# Patient Record
Sex: Male | Born: 1995 | Hispanic: No | Marital: Married | Smoking: Never smoker
Health system: Southern US, Community
[De-identification: ages and names within clinical notes are randomized; demographics above are authoritative.]

## PROBLEM LIST (undated history)

## (undated) DIAGNOSIS — D649 Anemia, unspecified: Secondary | ICD-10-CM

## (undated) DIAGNOSIS — D509 Iron deficiency anemia, unspecified: Secondary | ICD-10-CM

## (undated) DIAGNOSIS — K5090 Crohn's disease, unspecified, without complications: Secondary | ICD-10-CM

## (undated) DIAGNOSIS — R634 Abnormal weight loss: Secondary | ICD-10-CM

## (undated) DIAGNOSIS — I861 Scrotal varices: Secondary | ICD-10-CM

## (undated) DIAGNOSIS — K219 Gastro-esophageal reflux disease without esophagitis: Secondary | ICD-10-CM

## (undated) DIAGNOSIS — D72810 Lymphocytopenia: Secondary | ICD-10-CM

## (undated) HISTORY — DX: Gastro-esophageal reflux disease without esophagitis: K21.9

## (undated) HISTORY — DX: Lymphocytopenia: D72.810

## (undated) HISTORY — DX: Abnormal weight loss: R63.4

## (undated) HISTORY — DX: Iron deficiency anemia, unspecified: D50.9

## (undated) HISTORY — DX: Anemia, unspecified: D64.9

## (undated) HISTORY — DX: Scrotal varices: I86.1

---
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
## 2013-04-26 ENCOUNTER — Other Ambulatory Visit: Payer: Self-pay | Admitting: Hematology and Oncology

## 2013-04-26 ENCOUNTER — Encounter: Payer: Self-pay | Admitting: Hematology and Oncology

## 2013-04-26 DIAGNOSIS — D649 Anemia, unspecified: Secondary | ICD-10-CM

## 2013-04-26 DIAGNOSIS — D509 Iron deficiency anemia, unspecified: Secondary | ICD-10-CM | POA: Insufficient documentation

## 2013-04-26 DIAGNOSIS — D72819 Decreased white blood cell count, unspecified: Secondary | ICD-10-CM

## 2013-04-26 HISTORY — DX: Anemia, unspecified: D64.9

## 2013-04-27 ENCOUNTER — Other Ambulatory Visit (HOSPITAL_BASED_OUTPATIENT_CLINIC_OR_DEPARTMENT_OTHER): Payer: BC Managed Care – PPO | Admitting: Lab

## 2013-04-27 ENCOUNTER — Encounter (INDEPENDENT_AMBULATORY_CARE_PROVIDER_SITE_OTHER): Payer: Self-pay

## 2013-04-27 ENCOUNTER — Ambulatory Visit (HOSPITAL_BASED_OUTPATIENT_CLINIC_OR_DEPARTMENT_OTHER): Payer: BC Managed Care – PPO | Admitting: Hematology and Oncology

## 2013-04-27 ENCOUNTER — Telehealth: Payer: Self-pay | Admitting: Hematology and Oncology

## 2013-04-27 ENCOUNTER — Encounter: Payer: Self-pay | Admitting: Hematology and Oncology

## 2013-04-27 VITALS — BP 122/71 | HR 77 | Temp 97.7°F | Resp 18 | Ht 77.0 in | Wt 212.3 lb

## 2013-04-27 DIAGNOSIS — D649 Anemia, unspecified: Secondary | ICD-10-CM

## 2013-04-27 DIAGNOSIS — D72810 Lymphocytopenia: Secondary | ICD-10-CM

## 2013-04-27 DIAGNOSIS — D72819 Decreased white blood cell count, unspecified: Secondary | ICD-10-CM

## 2013-04-27 LAB — IRON AND TIBC CHCC
%SAT: 29 % (ref 20–55)
Iron: 104 ug/dL (ref 42–163)
TIBC: 365 ug/dL (ref 202–409)
UIBC: 261 ug/dL (ref 117–376)

## 2013-04-27 LAB — COMPREHENSIVE METABOLIC PANEL (CC13)
ALT: 23 U/L (ref 0–55)
AST: 14 U/L (ref 5–34)
Albumin: 3.6 g/dL (ref 3.5–5.0)
Alkaline Phosphatase: 64 U/L (ref 40–150)
Anion Gap: 8 mEq/L (ref 3–11)
BUN: 10.2 mg/dL (ref 7.0–26.0)
CO2: 28 mEq/L (ref 22–29)
Calcium: 9.7 mg/dL (ref 8.4–10.4)
Chloride: 104 mEq/L (ref 98–109)
Creatinine: 0.8 mg/dL (ref 0.7–1.3)
Glucose: 92 mg/dl (ref 70–140)
Potassium: 4 mEq/L (ref 3.5–5.1)
Sodium: 141 mEq/L (ref 136–145)
Total Bilirubin: 0.86 mg/dL (ref 0.20–1.20)
Total Protein: 7.5 g/dL (ref 6.4–8.3)

## 2013-04-27 LAB — CBC & DIFF AND RETIC
BASO%: 0.3 % (ref 0.0–2.0)
Basophils Absolute: 0 10*3/uL (ref 0.0–0.1)
EOS%: 1.7 % (ref 0.0–7.0)
Eosinophils Absolute: 0.1 10*3/uL (ref 0.0–0.5)
HCT: 40.7 % (ref 38.4–49.9)
HGB: 13.3 g/dL (ref 13.0–17.1)
Immature Retic Fract: 3.6 % (ref 3.00–10.60)
LYMPH%: 11.2 % — ABNORMAL LOW (ref 14.0–49.0)
MCH: 27.1 pg — ABNORMAL LOW (ref 27.2–33.4)
MCHC: 32.7 g/dL (ref 32.0–36.0)
MCV: 82.9 fL (ref 79.3–98.0)
MONO#: 0.9 10*3/uL (ref 0.1–0.9)
MONO%: 12.9 % (ref 0.0–14.0)
NEUT#: 4.9 10*3/uL (ref 1.5–6.5)
NEUT%: 73.9 % (ref 39.0–75.0)
Platelets: 309 10*3/uL (ref 140–400)
RBC: 4.91 10*6/uL (ref 4.20–5.82)
RDW: 15.4 % — ABNORMAL HIGH (ref 11.0–14.6)
Retic %: 0.9 % (ref 0.80–1.80)
Retic Ct Abs: 44.19 10*3/uL (ref 34.80–93.90)
WBC: 6.6 10*3/uL (ref 4.0–10.3)
lymph#: 0.7 10*3/uL — ABNORMAL LOW (ref 0.9–3.3)

## 2013-04-27 LAB — MORPHOLOGY: PLT EST: ADEQUATE

## 2013-04-27 LAB — FERRITIN CHCC: Ferritin: 12 ng/ml — ABNORMAL LOW (ref 22–316)

## 2013-04-27 NOTE — Progress Notes (Signed)
[facility] OFFICE PROGRESS NOTE  Emeterio Reeve, MD DIAGNOSIS:  Lymphopenia, anemia  SUMMARY OF HEMATOLOGIC HISTORY: This is an otherwise healthy 17 year old patient was referred here because of abnormal CBC. The patient was asymptomatic. He was placed on oral ion supplements for anemia. INTERVAL HISTORY: Happy [place] 17 y.o. male returns for further followup. The patient denies any recent signs or symptoms of bleeding such as spontaneous epistaxis, hematuria or hematochezia. He denies any recent fever, chills, night sweats or abnormal weight loss He denies any recent infection. No recent joint rashes or joint swelling.  I have reviewed the past medical history, past surgical history, social history and family history with the patient and they are unchanged from previous note.  ALLERGIES:  has No Known Allergies.  MEDICATIONS:  Current Outpatient Prescriptions  Medication Sig Dispense Refill  . B Complex-C (SUPER B COMPLEX PO) Take 1 tablet by mouth daily.      Marland Kitchen FERROUS SULFATE PO Take 65 mg by mouth daily.      . Multiple Vitamin (MULTIVITAMIN) tablet Take 1 tablet by mouth daily.       No current facility-administered medications for this visit.     REVIEW OF SYSTEMS:   Constitutional: Denies fevers, chills or night sweats Eyes: Denies blurriness of vision Ears, nose, mouth, throat, and face: Denies mucositis or sore throat Respiratory: Denies cough, dyspnea or wheezes Cardiovascular: Denies palpitation, chest discomfort or lower extremity swelling Gastrointestinal:  Denies nausea, heartburn or change in bowel habits Skin: Denies abnormal skin rashes Lymphatics: Denies new lymphadenopathy or easy bruising Neurological:Denies numbness, tingling or new weaknesses Behavioral/Psych: Mood is stable, no new changes  All other systems were reviewed with the patient and are negative.  PHYSICAL EXAMINATION: ECOG PERFORMANCE STATUS: 0 -  Asymptomatic  Filed Vitals:   04/27/13 1229  BP: 122/71  Pulse: 77  Temp: 97.7 F (36.5 C)  Resp: 18   Filed Weights   04/27/13 1229  Weight: 212 lb 4.8 oz (96.299 kg)    GENERAL:alert, no distress and comfortable SKIN: skin color, texture, turgor are normal, no rashes or significant lesions EYES: normal, Conjunctiva are pink and non-injected, sclera clear OROPHARYNX:no exudate, no erythema and lips, buccal mucosa, and tongue normal  NECK: supple, thyroid normal size, non-tender, without nodularity LYMPH:  no palpable lymphadenopathy in the cervical, axillary or inguinal LUNGS: clear to auscultation and percussion with normal breathing effort HEART: regular rate & rhythm and no murmurs and no lower extremity edema ABDOMEN:abdomen soft, non-tender and normal bowel sounds Musculoskeletal:no cyanosis of digits and no clubbing  NEURO: alert & oriented x 3 with fluent speech, no focal motor/sensory deficits  LABORATORY DATA:  I have reviewed the data as listed Results for orders placed in visit on 04/27/13 (from the past 48 hour(s))  CBC & DIFF AND RETIC     Status: Abnormal   Collection Time    04/27/13 12:16 PM      Result Value Range   WBC 6.6  4.0 - 10.3 10e3/uL   NEUT# 4.9  1.5 - 6.5 10e3/uL   HGB 13.3  13.0 - 17.1 g/dL   HCT 40.3  47.4 - 25.9 %   Platelets 309  140 - 400 10e3/uL   MCV 82.9  79.3 - 98.0 fL   MCH 27.1 (*) 27.2 - 33.4 pg   MCHC 32.7  32.0 - 36.0 g/dL   RBC 5.63  8.75 - 6.43 10e6/uL   RDW 15.4 (*) 11.0 - 14.6 %  lymph# 0.7 (*) 0.9 - 3.3 10e3/uL   MONO# 0.9  0.1 - 0.9 10e3/uL   Eosinophils Absolute 0.1  0.0 - 0.5 10e3/uL   Basophils Absolute 0.0  0.0 - 0.1 10e3/uL   NEUT% 73.9  39.0 - 75.0 %   LYMPH% 11.2 (*) 14.0 - 49.0 %   MONO% 12.9  0.0 - 14.0 %   EOS% 1.7  0.0 - 7.0 %   BASO% 0.3  0.0 - 2.0 %   Retic % 0.90  0.80 - 1.80 %   Retic Ct Abs 44.19  34.80 - 93.90 10e3/uL   Immature Retic Fract 3.60  3.00 - 10.60 %    ASSESSMENT:  #1 anemia,  resolved #2 lymphopenia  PLAN:  #1 anemia, resolved Cause is unknown. He was taking oral iron supplement. Ferritin level is pending. The ferritin is above 50 will stop the iron supplement and monitor. #2 lymphopenia His total white count is normal. The patient is asymptomatic. Causes unknown. We will observe. I plan to see him back in 9 months with repeat blood work and additional workup. In the meantime we will just observe as the patient is asymptomatic. All questions were answered. The patient knows to call the clinic with any problems, questions or concerns. No barriers to learning was detected.  I spent 15 minutes counseling the patient face to face. The total time spent in the appointment was 20 minutes and more than 50% was on counseling.     [facility], NI, MD 04/27/2013 12:44 PM

## 2013-04-27 NOTE — Telephone Encounter (Signed)
Gave pt appt for lab and MD on July 2015

## 2014-01-25 ENCOUNTER — Ambulatory Visit (HOSPITAL_BASED_OUTPATIENT_CLINIC_OR_DEPARTMENT_OTHER): Payer: BC Managed Care – PPO | Admitting: Hematology and Oncology

## 2014-01-25 ENCOUNTER — Encounter: Payer: Self-pay | Admitting: Hematology and Oncology

## 2014-01-25 ENCOUNTER — Other Ambulatory Visit (HOSPITAL_BASED_OUTPATIENT_CLINIC_OR_DEPARTMENT_OTHER): Payer: BC Managed Care – PPO

## 2014-01-25 VITALS — BP 132/76 | HR 69 | Temp 98.6°F | Resp 18 | Ht 77.0 in | Wt 194.8 lb

## 2014-01-25 DIAGNOSIS — D72819 Decreased white blood cell count, unspecified: Secondary | ICD-10-CM

## 2014-01-25 DIAGNOSIS — D72810 Lymphocytopenia: Secondary | ICD-10-CM

## 2014-01-25 DIAGNOSIS — Z862 Personal history of diseases of the blood and blood-forming organs and certain disorders involving the immune mechanism: Secondary | ICD-10-CM

## 2014-01-25 DIAGNOSIS — D509 Iron deficiency anemia, unspecified: Secondary | ICD-10-CM

## 2014-01-25 DIAGNOSIS — D649 Anemia, unspecified: Secondary | ICD-10-CM

## 2014-01-25 LAB — CBC WITH DIFFERENTIAL/PLATELET
BASO%: 0.8 % (ref 0.0–2.0)
Basophils Absolute: 0.1 10*3/uL (ref 0.0–0.1)
EOS%: 2.6 % (ref 0.0–7.0)
Eosinophils Absolute: 0.2 10*3/uL (ref 0.0–0.5)
HCT: 42.8 % (ref 38.4–49.9)
HGB: 13.7 g/dL (ref 13.0–17.1)
LYMPH%: 14.4 % (ref 14.0–49.0)
MCH: 26.9 pg — ABNORMAL LOW (ref 27.2–33.4)
MCHC: 32 g/dL (ref 32.0–36.0)
MCV: 84 fL (ref 79.3–98.0)
MONO#: 1 10*3/uL — ABNORMAL HIGH (ref 0.1–0.9)
MONO%: 13.9 % (ref 0.0–14.0)
NEUT#: 5.1 10*3/uL (ref 1.5–6.5)
NEUT%: 68.3 % (ref 39.0–75.0)
Platelets: 333 10*3/uL (ref 140–400)
RBC: 5.09 10*6/uL (ref 4.20–5.82)
RDW: 15.7 % — ABNORMAL HIGH (ref 11.0–14.6)
WBC: 7.4 10*3/uL (ref 4.0–10.3)
lymph#: 1.1 10*3/uL (ref 0.9–3.3)

## 2014-01-25 LAB — MORPHOLOGY
PLT EST: ADEQUATE
RBC Comments: NORMAL

## 2014-01-25 LAB — FERRITIN CHCC: Ferritin: 30 ng/ml (ref 22–316)

## 2014-01-25 NOTE — Assessment & Plan Note (Signed)
This has resolved. No further workup is needed.

## 2014-01-25 NOTE — Progress Notes (Signed)
[place] OFFICE PROGRESS NOTE  Mark Coma, MD SUMMARY OF HEMATOLOGIC HISTORY: This is an otherwise healthy 18 year old patient was referred here because of abnormal CBC. The patient was asymptomatic. He was placed on oral ion supplements for anemia. INTERVAL HISTORY: Mark Decker 18 y.o. male returns for further followup. He denies recent infection.  I have reviewed the past medical history, past surgical history, social history and family history with the patient and they are unchanged from previous note.  ALLERGIES:  has No Known Allergies.  MEDICATIONS:  Current Outpatient Prescriptions  Medication Sig Dispense Refill  . B Complex-C (SUPER B COMPLEX PO) Take 1 tablet by mouth daily.      Marland Kitchen FERROUS SULFATE PO Take 65 mg by mouth daily.      . Multiple Vitamin (MULTIVITAMIN) tablet Take 1 tablet by mouth daily.       No current facility-administered medications for this visit.     REVIEW OF SYSTEMS:   Constitutional: Denies fevers, chills or night sweats Eyes: Denies blurriness of vision Ears, nose, mouth, throat, and face: Denies mucositis or sore throat Respiratory: Denies cough, dyspnea or wheezes Cardiovascular: Denies palpitation, chest discomfort or lower extremity swelling Gastrointestinal:  Denies nausea, heartburn or change in bowel habits Skin: Denies abnormal skin rashes Lymphatics: Denies new lymphadenopathy or easy bruising Neurological:Denies numbness, tingling or new weaknesses Behavioral/Psych: Mood is stable, no new changes  All other systems were reviewed with the patient and are negative.  PHYSICAL EXAMINATION: ECOG PERFORMANCE STATUS: 0 - Asymptomatic  Filed Vitals:   01/25/14 1425  BP: 132/76  Pulse: 69  Temp: 98.6 F (37 C)  Resp: 18   Filed Weights   01/25/14 1425  Weight: 194 lb 12.8 oz (88.361 kg)    GENERAL:alert, no distress and comfortable SKIN: skin color, texture, turgor are normal, no rashes or  significant lesions Musculoskeletal:no cyanosis of digits and no clubbing  NEURO: alert & oriented x 3 with fluent speech, no focal motor/sensory deficits  LABORATORY DATA:  I have reviewed the data as listed Results for orders placed in visit on 01/25/14 (from the past 48 hour(s))  MORPHOLOGY     Status: None   Collection Time    01/25/14  2:09 PM      Result Value Ref Range   RBC Comments Within Normal Limits  Within Normal Limits   White Cell Comments C/W auto diff     PLT EST Adequate  Adequate  FERRITIN [facility]     Status: None   Collection Time    01/25/14  2:09 PM      Result Value Ref Range   Ferritin 30  22 - 316 ng/ml  CBC WITH DIFFERENTIAL     Status: Abnormal   Collection Time    01/25/14  2:12 PM      Result Value Ref Range   WBC 7.4  4.0 - 10.3 10e3/uL   NEUT# 5.1  1.5 - 6.5 10e3/uL   HGB 13.7  13.0 - 17.1 g/dL   HCT 42.8  38.4 - 49.9 %   Platelets 333  140 - 400 10e3/uL   MCV 84.0  79.3 - 98.0 fL   MCH 26.9 (*) 27.2 - 33.4 pg   MCHC 32.0  32.0 - 36.0 g/dL   RBC 5.09  4.20 - 5.82 10e6/uL   RDW 15.7 (*) 11.0 - 14.6 %   lymph# 1.1  0.9 - 3.3 10e3/uL   MONO# 1.0 (*) 0.1 - 0.9 10e3/uL  Eosinophils Absolute 0.2  0.0 - 0.5 10e3/uL   Basophils Absolute 0.1  0.0 - 0.1 10e3/uL   NEUT% 68.3  39.0 - 75.0 %   LYMPH% 14.4  14.0 - 49.0 %   MONO% 13.9  0.0 - 14.0 %   EOS% 2.6  0.0 - 7.0 %   BASO% 0.8  0.0 - 2.0 %    Lab Results  Component Value Date   WBC 7.4 01/25/2014   HGB 13.7 01/25/2014   HCT 42.8 01/25/2014   MCV 84.0 01/25/2014   PLT 333 01/25/2014    ASSESSMENT & PLAN:  Iron deficiency anemia, unspecified This has resolved. I recommend discontinuation of oral iron supplement.  Leukopenia This has resolved. No further workup is needed.    All questions were answered. The patient knows to call the clinic with any problems, questions or concerns. No barriers to learning was detected.  I spent 10 minutes counseling the patient face to face. The total time  spent in the appointment was 15 minutes and more than 50% was on counseling.     [facility], NI, MD 01/25/2014 4:04 PM

## 2014-01-25 NOTE — Assessment & Plan Note (Signed)
This has resolved. I recommend discontinuation of oral iron supplement.

## 2014-01-26 LAB — SEDIMENTATION RATE: Sed Rate: 9 mm/hr (ref 0–16)

## 2014-05-22 ENCOUNTER — Other Ambulatory Visit: Payer: Self-pay | Admitting: Gastroenterology

## 2014-05-22 DIAGNOSIS — K5010 Crohn's disease of large intestine without complications: Secondary | ICD-10-CM

## 2014-06-05 ENCOUNTER — Ambulatory Visit
Admission: RE | Admit: 2014-06-05 | Discharge: 2014-06-05 | Disposition: A | Discharge status: Home / Self Care | Payer: BC Managed Care – PPO | Source: Ambulatory Visit | Attending: Gastroenterology | Admitting: Gastroenterology

## 2014-06-05 DIAGNOSIS — K5010 Crohn's disease of large intestine without complications: Secondary | ICD-10-CM

## 2014-06-05 MED ORDER — IOHEXOL 300 MG/ML  SOLN
100.0000 mL | Freq: Once | INTRAMUSCULAR | Status: AC | PRN
  Administered 2014-06-05: 100 mL via INTRAVENOUS

## 2014-09-05 ENCOUNTER — Ambulatory Visit
Admission: RE | Admit: 2014-09-05 | Discharge: 2014-09-05 | Disposition: A | Discharge status: Home / Self Care | Payer: BLUE CROSS/BLUE SHIELD | Source: Ambulatory Visit | Attending: Gastroenterology | Admitting: Gastroenterology

## 2014-09-05 ENCOUNTER — Other Ambulatory Visit: Payer: Self-pay | Admitting: Gastroenterology

## 2014-09-05 DIAGNOSIS — K50919 Crohn's disease, unspecified, with unspecified complications: Secondary | ICD-10-CM

## 2015-01-17 ENCOUNTER — Ambulatory Visit
Admission: RE | Admit: 2015-01-17 | Discharge: 2015-01-17 | Disposition: A | Discharge status: Home / Self Care | Payer: BLUE CROSS/BLUE SHIELD | Source: Ambulatory Visit | Attending: Gastroenterology | Admitting: Gastroenterology

## 2015-01-17 ENCOUNTER — Other Ambulatory Visit: Payer: Self-pay | Admitting: Gastroenterology

## 2015-01-17 DIAGNOSIS — Z8719 Personal history of other diseases of the digestive system: Secondary | ICD-10-CM

## 2015-02-03 ENCOUNTER — Other Ambulatory Visit: Payer: Self-pay | Admitting: Gastroenterology

## 2015-02-03 DIAGNOSIS — K50819 Crohn's disease of both small and large intestine with unspecified complications: Secondary | ICD-10-CM

## 2015-02-04 ENCOUNTER — Ambulatory Visit
Admission: RE | Admit: 2015-02-04 | Discharge: 2015-02-04 | Disposition: A | Discharge status: Home / Self Care | Payer: BLUE CROSS/BLUE SHIELD | Source: Ambulatory Visit | Attending: Gastroenterology | Admitting: Gastroenterology

## 2015-02-04 DIAGNOSIS — K50819 Crohn's disease of both small and large intestine with unspecified complications: Secondary | ICD-10-CM

## 2015-02-28 ENCOUNTER — Encounter (HOSPITAL_COMMUNITY): Payer: Self-pay | Admitting: *Deleted

## 2015-02-28 ENCOUNTER — Emergency Department (HOSPITAL_COMMUNITY)
Admission: EM | Admit: 2015-02-28 | Discharge: 2015-02-28 | Disposition: A | Discharge status: Home / Self Care | Payer: BLUE CROSS/BLUE SHIELD | Attending: Emergency Medicine | Admitting: Emergency Medicine

## 2015-02-28 DIAGNOSIS — R109 Unspecified abdominal pain: Secondary | ICD-10-CM

## 2015-02-28 DIAGNOSIS — Z8679 Personal history of other diseases of the circulatory system: Secondary | ICD-10-CM | POA: Diagnosis not present

## 2015-02-28 DIAGNOSIS — K5090 Crohn's disease, unspecified, without complications: Secondary | ICD-10-CM | POA: Diagnosis not present

## 2015-02-28 DIAGNOSIS — Z79899 Other long term (current) drug therapy: Secondary | ICD-10-CM | POA: Diagnosis not present

## 2015-02-28 DIAGNOSIS — R1030 Lower abdominal pain, unspecified: Secondary | ICD-10-CM | POA: Diagnosis present

## 2015-02-28 DIAGNOSIS — D509 Iron deficiency anemia, unspecified: Secondary | ICD-10-CM | POA: Diagnosis not present

## 2015-02-28 DIAGNOSIS — K50919 Crohn's disease, unspecified, with unspecified complications: Secondary | ICD-10-CM

## 2015-02-28 HISTORY — DX: Crohn's disease, unspecified, without complications: K50.90

## 2015-02-28 MED ORDER — PROMETHAZINE HCL 25 MG PO TABS: 25.0000 mg | ORAL_TABLET | Freq: Four times a day (QID) | ORAL | Status: DC | PRN

## 2015-02-28 MED ORDER — HYDROCODONE-ACETAMINOPHEN 5-325 MG PO TABS: 1.0000 | ORAL_TABLET | Freq: Four times a day (QID) | ORAL | Status: DC | PRN

## 2015-02-28 MED ORDER — PROMETHAZINE HCL 25 MG PO TABS
25.0000 mg | ORAL_TABLET | Freq: Once | ORAL | Status: AC
  Administered 2015-02-28: 25 mg via ORAL
  Filled 2015-02-28: qty 1

## 2015-02-28 MED ORDER — PREDNISONE 20 MG PO TABS: ORAL_TABLET | ORAL | Status: DC

## 2015-02-28 MED ORDER — OXYCODONE-ACETAMINOPHEN 5-325 MG PO TABS
1.0000 | ORAL_TABLET | Freq: Once | ORAL | Status: AC
  Administered 2015-02-28: 1 via ORAL
  Filled 2015-02-28: qty 1

## 2015-02-28 NOTE — Discharge Instructions (Signed)
Please follow up with Dr. Michail Sermon to schedule a follow up appointment. Please take pain medication and/or muscle relaxants as prescribed and as needed for pain. Please do not drive on narcotic pain medication or on muscle relaxants. Please read all discharge instructions and return precautions.    Crohn Disease Crohn disease is a long-term (chronic) soreness and redness (inflammation) of the intestines (bowel). It can affect any portion of the digestive tract, from the mouth to the anus. It can also cause problems outside the digestive tract. Crohn disease is closely related to a disease called ulcerative colitis (together, these two diseases are called inflammatory bowel disease).  CAUSES  The cause of Crohn disease is not known. One Link Snuffer is that, in an easily affected person, the immune system is triggered to attack the body's own digestive tissue. Crohn disease runs in families. It seems to be more common in certain geographic areas and amongst certain races. There are no clear-cut dietary causes.  SYMPTOMS  Crohn disease can cause many different symptoms since it can affect many different parts of the body. Symptoms include:  Fatigue.  Weight loss.  Chronic diarrhea, sometime bloody.  Abdominal pain and cramps.  Fever.  Ulcers or canker sores in the mouth or rectum.  Anemia (low red blood cells).  Arthritis, skin problems, and eye problems may occur. Complications of Crohn disease can include:  Series of holes (perforation) of the bowel.  Portions of the intestines sticking to each other (adhesions).  Obstruction of the bowel.  Fistula formation, typically in the rectal area but also in other areas. A fistula is an opening between the bowels and the outside, or between the bowels and another organ.  A painful crack in the mucous membrane of the anus (rectal fissure). DIAGNOSIS  Your caregiver may suspect Crohn disease based on your symptoms and an exam. Blood tests may  confirm that there is a problem. You may be asked to submit a stool specimen for examination. X-rays and CT scans may be necessary. Ultimately, the diagnosis is usually made after a procedure that uses a flexible tube that is inserted via your mouth or your anus. This is done under sedation and is called either an upper endoscopy or colonoscopy. With these tests, the specialist can take tiny tissue samples and remove them from the inside of the bowel (biopsy). Examination of this biopsy tissue under a microscope can reveal Crohn disease as the cause of your symptoms. Due to the many different forms that Crohn disease can take, symptoms may be present for several years before a diagnosis is made. TREATMENT  Medications are often used to decrease inflammation and control the immune system. These include medicines related to aspirin, steroid medications, and newer and stronger medications to slow down the immune system. Some medications may be used as suppositories or enemas. A number of other medications are used or have been studied. Your caregiver will make specific recommendations. HOME CARE INSTRUCTIONS   Symptoms such as diarrhea can be controlled with medications. Avoid foods that have a laxative effect such as fresh fruit, vegetables, and dairy products. During flare-ups, you can rest your bowel by refraining from solid foods. Drink clear liquids frequently during the day. (Electrolyte or rehydrating fluids are best. Your caregiver can help you with suggestions.) Drink often to prevent loss of body fluids (dehydration). When diarrhea has cleared, eat small meals and more frequently. Avoid food additives and stimulants such as caffeine (coffee, tea, or chocolate). Enzyme supplements may help if  you develop intolerance to a sugar in dairy products (lactose). Ask your caregiver or dietitian about specific dietary instructions.  Try to maintain a positive attitude. Learn relaxation techniques such as  self-hypnosis, mental imaging, and muscle relaxation.  If possible, avoid stresses which can aggravate your condition.  Exercise regularly.  Follow your diet.  Always get plenty of rest. SEEK MEDICAL CARE IF:   Your symptoms fail to improve after a week or two of new treatment.  You experience continued weight loss.  You have ongoing cramps or loose bowels.  You develop a new skin rash, skin sores, or eye problems. SEEK IMMEDIATE MEDICAL CARE IF:   You have worsening of your symptoms or develop new symptoms.  You have a fever.  You develop bloody diarrhea.  You develop severe abdominal pain. MAKE SURE YOU:   Understand these instructions.  Will watch your condition.  Will get help right away if you are not doing well or get worse. Document Released: 03/24/2005 Document Revised: 10/29/2013 Document Reviewed: 02/20/2007 [facility] Patient Information 2015 [place], [place]. This information is not intended to replace advice given to you by your health care provider. Make sure you discuss any questions you have with your health care provider.

## 2015-02-28 NOTE — ED Notes (Signed)
Lab work drawn at [facility]

## 2015-02-28 NOTE — ED Provider Notes (Signed)
CSN: [id number]     Arrival date & time 02/28/15  1941 History   First MD Initiated Contact with Patient 02/28/15 2005     Chief Complaint  Patient presents with  . Abdominal Pain     (Consider location/radiation/quality/duration/timing/severity/associated sxs/prior Treatment) HPI Comments: Patient is a 19 yo M PMHx significant for Crohn's disease, Anemia, GERD, Lymphopenia presenting to the ED from [facility] for evaluation of leukocytosis. Patient states he went to [facility] today because he had been having a crohn's flare up. He states sharp intermittent lower abdominal pain with bright red blood in his stools. He states he had nausea and vomiting earlier in the day, but that has subsided. His pain has also improved as well. He states he was told to come to the ER because his WBC was 16.3, labs otherwise unremarkable. He is followed by Dr. Michail Sermon. No abdominal surgical history.   Patient is a 19 y.o. male presenting with abdominal pain.  Abdominal Pain   Past Medical History  Diagnosis Date  . Lymphopenia 04/14/2012    HIV negative in 03/2012 per PCP.  Marland Kitchen Gastroesophageal reflux disease   . Left varicocele   . Iron deficiency anemia   . Weight loss   . Anemia 04/26/2013  . Lymphopenia 04/27/2013  . Crohn disease    Past Surgical History  Procedure Laterality Date  . Varicocele excision  2010   Family History  Problem Relation Age of Onset  . Arthritis Mother   . Crohn's disease Mother   . Hypertension Mother   . IgA nephropathy Mother   . Arthritis Father   . Migraines Father   . Eczema Father   . Cancer Maternal Aunt 60    colon  . Cancer Paternal Grandfather     colon  . Migraines Sister    Social History  Substance Use Topics  . Smoking status: Never Smoker   . Smokeless tobacco: Never Used  . Alcohol Use: No    Review of Systems  Gastrointestinal: Positive for abdominal pain and blood in stool.  All other systems reviewed and are negative.     Allergies  Review  of patient's allergies indicates no known allergies.  Home Medications   Prior to Admission medications   Medication Sig Start Date End Date Taking? Authorizing Provider  Adalimumab (HUMIRA) 40 MG/0.8ML PSKT Inject into the skin. Patient  not sure how much he gets every two weeks   Yes Historical Provider, MD  B Complex-C (SUPER B COMPLEX PO) Take 1 tablet by mouth daily.   Yes Historical Provider, MD  FERROUS SULFATE PO Take 65 mg by mouth daily.   Yes Historical Provider, MD  mesalamine (LIALDA) 1.2 G EC tablet Take 1.2 g by mouth 4 (four) times daily.   Yes Historical Provider, MD  Multiple Vitamin (MULTIVITAMIN) tablet Take 1 tablet by mouth daily.   Yes Historical Provider, MD  HYDROcodone-acetaminophen (NORCO/VICODIN) 5-325 MG per tablet Take 1-2 tablets by mouth every 6 (six) hours as needed. 02/28/15   Jennifer Piepenbrink, PA-C  predniSONE (DELTASONE) 20 MG tablet Take 40mg  PO x 3 days. Take 20 mg PO until seen by Dr. Michail Sermon 02/28/15   Baron Sane, PA-C  promethazine (PHENERGAN) 25 MG tablet Take 1 tablet (25 mg total) by mouth every 6 (six) hours as needed for nausea or vomiting. 02/28/15   Jennifer Piepenbrink, PA-C   BP 117/68 mmHg  Pulse 75  Temp(Src) 98.6 F (37 C)  Resp 16  Ht 6\' 5"  (1.956 m)  Wt 204 lb 2 oz (92.59 kg)  BMI 24.20 kg/m2  SpO2 99% Physical Exam  Constitutional: He is oriented to person, place, and time. He appears well-developed and well-nourished.  HENT:  Head: Normocephalic and atraumatic.  Right Ear: External ear normal.  Left Ear: External ear normal.  Nose: Nose normal.  Eyes: Conjunctivae are normal.  Neck: Neck supple.  Cardiovascular: Normal rate, regular rhythm and normal heart sounds.   Pulmonary/Chest: Effort normal and breath sounds normal.  Abdominal: Soft. Bowel sounds are normal. He exhibits no distension. There is tenderness (lower abdomen). There is no rebound and no guarding.  Musculoskeletal: Normal range of motion.    Neurological: He is alert and oriented to person, place, and time.  Skin: Skin is warm and dry.  Nursing note and vitals reviewed.   ED Course  Procedures (including critical care time) Medications  promethazine (PHENERGAN) tablet 25 mg (25 mg Oral Given 02/28/15 2110)  oxyCODONE-acetaminophen (PERCOCET/ROXICET) 5-325 MG per tablet 1 tablet (1 tablet Oral Given 02/28/15 2111)    Labs Review Labs Reviewed - No data to display  Imaging Review No results found. I have personally reviewed and evaluated these images and lab results as part of my medical decision-making.   EKG Interpretation None          MDM   Final diagnoses:  Abdominal pain in male  Crohn's disease, unspecified complication   Filed Vitals:   02/28/15 2130  BP: 117/68  Pulse: 75  Temp:   Resp: 16    Afebrile, NAD, non-toxic appearing, AAOx4.   Abdomen soft, tender in lower quadrants, without peritoneal signs. No evidence of dehydration. Labs reviewed from [facility].  Patient states he is feeling better and would to be d/c home with nausea and pain medication, with plans to follow up with Dr. Michail Sermon on Monday. Offered IV fluids, nausea, and pain medications, but he prefers to go home with symptomatic care.   Discussed with Dr. Watt Climes who recommends Prednisone 40mg  QD x 3 days then 20 mg QD until Tuesday when he can see Dr. Michail Sermon.   Return precautions discussed. Patient is agreeable to plan. Patient is stable at time of discharge. Patient d/w with Dr. Maryan Rued, agrees with plan.     Baron Sane, PA-C 02/28/15 2228  Blanchie Dessert, MD 02/28/15 2256

## 2015-02-28 NOTE — ED Notes (Signed)
Jen PA at bedside. 

## 2015-02-28 NOTE — ED Notes (Signed)
The pt  Came here from [facility] with abd oain since this am with nv.Marland Kitchen Hx crohns diusease

## 2015-05-13 ENCOUNTER — Ambulatory Visit
Admission: RE | Admit: 2015-05-13 | Discharge: 2015-05-13 | Disposition: A | Discharge status: Home / Self Care | Payer: BLUE CROSS/BLUE SHIELD | Source: Ambulatory Visit | Attending: Gastroenterology | Admitting: Gastroenterology

## 2015-05-13 ENCOUNTER — Other Ambulatory Visit: Payer: Self-pay | Admitting: Gastroenterology

## 2015-05-13 DIAGNOSIS — K50819 Crohn's disease of both small and large intestine with unspecified complications: Secondary | ICD-10-CM

## 2015-05-13 MED ORDER — IOPAMIDOL (ISOVUE-300) INJECTION 61%
125.0000 mL | Freq: Once | INTRAVENOUS | Status: AC | PRN
  Administered 2015-05-13: 125 mL via INTRAVENOUS

## 2015-05-14 ENCOUNTER — Encounter (HOSPITAL_COMMUNITY): Payer: Self-pay | Admitting: *Deleted

## 2015-05-16 ENCOUNTER — Other Ambulatory Visit: Payer: Self-pay | Admitting: Gastroenterology

## 2015-05-16 NOTE — Addendum Note (Signed)
Addended by: [name] on: 05/16/2015 05:45 PM   Modules accepted: Orders

## 2015-05-21 ENCOUNTER — Ambulatory Visit (HOSPITAL_COMMUNITY)
Admission: RE | Admit: 2015-05-21 | Payer: BLUE CROSS/BLUE SHIELD | Source: Ambulatory Visit | Admitting: Gastroenterology

## 2015-05-21 SURGERY — COLONOSCOPY WITH PROPOFOL
Anesthesia: Monitor Anesthesia Care

## 2015-09-25 ENCOUNTER — Encounter (INDEPENDENT_AMBULATORY_CARE_PROVIDER_SITE_OTHER): Payer: BLUE CROSS/BLUE SHIELD | Admitting: Ophthalmology

## 2015-09-25 DIAGNOSIS — H43813 Vitreous degeneration, bilateral: Secondary | ICD-10-CM

## 2015-09-25 DIAGNOSIS — H33303 Unspecified retinal break, bilateral: Secondary | ICD-10-CM

## 2015-09-26 ENCOUNTER — Encounter (INDEPENDENT_AMBULATORY_CARE_PROVIDER_SITE_OTHER): Payer: BLUE CROSS/BLUE SHIELD | Admitting: Ophthalmology

## 2015-09-26 DIAGNOSIS — H33302 Unspecified retinal break, left eye: Secondary | ICD-10-CM | POA: Diagnosis not present

## 2015-10-15 ENCOUNTER — Encounter (INDEPENDENT_AMBULATORY_CARE_PROVIDER_SITE_OTHER): Payer: BLUE CROSS/BLUE SHIELD | Admitting: Ophthalmology

## 2015-10-15 DIAGNOSIS — H33303 Unspecified retinal break, bilateral: Secondary | ICD-10-CM | POA: Diagnosis not present

## 2015-10-23 ENCOUNTER — Ambulatory Visit (INDEPENDENT_AMBULATORY_CARE_PROVIDER_SITE_OTHER): Payer: BLUE CROSS/BLUE SHIELD | Admitting: Ophthalmology

## 2015-11-05 ENCOUNTER — Encounter (INDEPENDENT_AMBULATORY_CARE_PROVIDER_SITE_OTHER): Payer: BLUE CROSS/BLUE SHIELD | Admitting: Ophthalmology

## 2015-11-05 DIAGNOSIS — H33303 Unspecified retinal break, bilateral: Secondary | ICD-10-CM

## 2016-03-08 ENCOUNTER — Ambulatory Visit (INDEPENDENT_AMBULATORY_CARE_PROVIDER_SITE_OTHER): Payer: BLUE CROSS/BLUE SHIELD | Admitting: Ophthalmology

## 2016-03-08 DIAGNOSIS — H33303 Unspecified retinal break, bilateral: Secondary | ICD-10-CM

## 2016-03-08 DIAGNOSIS — H43813 Vitreous degeneration, bilateral: Secondary | ICD-10-CM

## 2016-12-27 DIAGNOSIS — H9012 Conductive hearing loss, unilateral, left ear, with unrestricted hearing on the contralateral side: Secondary | ICD-10-CM

## 2016-12-27 DIAGNOSIS — H6502 Acute serous otitis media, left ear: Secondary | ICD-10-CM | POA: Insufficient documentation

## 2016-12-27 HISTORY — DX: Conductive hearing loss, unilateral, left ear, with unrestricted hearing on the contralateral side: H90.12

## 2020-01-11 ENCOUNTER — Other Ambulatory Visit: Payer: Self-pay | Admitting: Family Medicine

## 2020-01-11 DIAGNOSIS — R591 Generalized enlarged lymph nodes: Secondary | ICD-10-CM

## 2020-01-16 ENCOUNTER — Ambulatory Visit
Admission: RE | Admit: 2020-01-16 | Discharge: 2020-01-16 | Disposition: A | Discharge status: Home / Self Care | Payer: BLUE CROSS/BLUE SHIELD | Source: Ambulatory Visit | Attending: Family Medicine | Admitting: Family Medicine

## 2020-01-16 DIAGNOSIS — R591 Generalized enlarged lymph nodes: Secondary | ICD-10-CM

## 2020-01-24 ENCOUNTER — Telehealth: Payer: Self-pay | Admitting: Hematology and Oncology

## 2020-01-24 NOTE — Telephone Encounter (Signed)
Received a new pt referral from Dr. Stephanie Acre for lymphocytopenia. [name] has been cld and scheduled to see [name] on 8/10 at 1pm. Pt last saw MD in 2015. Pt aware to arrive 15 minutes early.

## 2020-02-05 ENCOUNTER — Telehealth: Payer: Self-pay

## 2020-02-05 ENCOUNTER — Inpatient Hospital Stay: Payer: BC Managed Care – PPO | Admitting: Hematology and Oncology

## 2020-02-05 NOTE — Telephone Encounter (Signed)
He called and left a message to call him  Called back. He has a fever 99.7 today and he sometimes has fevers that last for 48 hours. Appt canceled for today. Instructed to go to PCP or Urgent care for a covid test and call back to reschedule appt once he has the results. He verbalized understanding.

## 2020-02-06 ENCOUNTER — Telehealth: Payer: Self-pay | Admitting: *Deleted

## 2020-02-06 NOTE — Telephone Encounter (Signed)
Per Dr.Gorsuch, called to make pt aware of 8/18 appt at 820am. Pt verbalized understanding.

## 2020-02-07 ENCOUNTER — Encounter: Payer: BC Managed Care – PPO | Admitting: Hematology

## 2020-02-13 ENCOUNTER — Other Ambulatory Visit: Payer: Self-pay

## 2020-02-13 ENCOUNTER — Ambulatory Visit (INDEPENDENT_AMBULATORY_CARE_PROVIDER_SITE_OTHER): Payer: BC Managed Care – PPO

## 2020-02-13 ENCOUNTER — Inpatient Hospital Stay: Payer: BC Managed Care – PPO | Attending: Hematology and Oncology | Admitting: Hematology and Oncology

## 2020-02-13 ENCOUNTER — Inpatient Hospital Stay: Payer: BC Managed Care – PPO

## 2020-02-13 ENCOUNTER — Encounter: Payer: Self-pay | Admitting: Hematology and Oncology

## 2020-02-13 ENCOUNTER — Telehealth: Payer: Self-pay

## 2020-02-13 DIAGNOSIS — D539 Nutritional anemia, unspecified: Secondary | ICD-10-CM

## 2020-02-13 DIAGNOSIS — K50919 Crohn's disease, unspecified, with unspecified complications: Secondary | ICD-10-CM

## 2020-02-13 DIAGNOSIS — R590 Localized enlarged lymph nodes: Secondary | ICD-10-CM

## 2020-02-13 DIAGNOSIS — K5090 Crohn's disease, unspecified, without complications: Secondary | ICD-10-CM | POA: Diagnosis not present

## 2020-02-13 DIAGNOSIS — R509 Fever, unspecified: Secondary | ICD-10-CM | POA: Diagnosis not present

## 2020-02-13 LAB — COMPREHENSIVE METABOLIC PANEL
ALT: 26 U/L (ref 0–44)
AST: 17 U/L (ref 15–41)
Albumin: 3.3 g/dL — ABNORMAL LOW (ref 3.5–5.0)
Alkaline Phosphatase: 64 U/L (ref 38–126)
Anion gap: 7 (ref 5–15)
BUN: 14 mg/dL (ref 6–20)
CO2: 26 mmol/L (ref 22–32)
Calcium: 9.9 mg/dL (ref 8.9–10.3)
Chloride: 104 mmol/L (ref 98–111)
Creatinine, Ser: 0.85 mg/dL (ref 0.61–1.24)
GFR calc Af Amer: >60 mL/min (ref >60)
GFR calc non Af Amer: >60 mL/min (ref >60)
Glucose, Bld: 102 mg/dL — ABNORMAL HIGH (ref 70–99)
Potassium: 4.2 mmol/L (ref 3.5–5.1)
Sodium: 137 mmol/L (ref 135–145)
Total Bilirubin: 0.4 mg/dL (ref 0.3–1.2)
Total Protein: 8.2 g/dL — ABNORMAL HIGH (ref 6.5–8.1)

## 2020-02-13 LAB — CBC WITH DIFFERENTIAL/PLATELET
Abs Immature Granulocytes: 0.01 10*3/uL (ref 0.00–0.07)
Basophils Absolute: 0 10*3/uL (ref 0.0–0.1)
Basophils Relative: 1 %
Eosinophils Absolute: 0.1 10*3/uL (ref 0.0–0.5)
Eosinophils Relative: 1 %
HCT: 37.7 % — ABNORMAL LOW (ref 39.0–52.0)
Hemoglobin: 12.3 g/dL — ABNORMAL LOW (ref 13.0–17.0)
Immature Granulocytes: 0 %
Lymphocytes Relative: 33 %
Lymphs Abs: 1.8 10*3/uL (ref 0.7–4.0)
MCH: 26.9 pg (ref 26.0–34.0)
MCHC: 32.6 g/dL (ref 30.0–36.0)
MCV: 82.5 fL (ref 80.0–100.0)
Monocytes Absolute: 1 10*3/uL (ref 0.1–1.0)
Monocytes Relative: 18 %
Neutro Abs: 2.6 10*3/uL (ref 1.7–7.7)
Neutrophils Relative %: 47 %
Platelets: 316 10*3/uL (ref 150–400)
RBC: 4.57 MIL/uL (ref 4.22–5.81)
RDW: 14 % (ref 11.5–15.5)
WBC: 5.6 10*3/uL (ref 4.0–10.5)
nRBC: 0 % (ref 0.0–0.2)

## 2020-02-13 LAB — HIV ANTIBODY (ROUTINE TESTING W REFLEX): HIV Screen 4th Generation wRfx: NONREACTIVE

## 2020-02-13 LAB — SEDIMENTATION RATE: Sed Rate: 88 mm/hr — ABNORMAL HIGH (ref 0–16)

## 2020-02-13 LAB — VITAMIN B12: Vitamin B-12: 472 pg/mL (ref 180–914)

## 2020-02-13 LAB — LACTATE DEHYDROGENASE: LDH: 152 U/L (ref 98–192)

## 2020-02-13 LAB — IRON AND TIBC
Iron: 30 ug/dL — ABNORMAL LOW (ref 42–163)
Saturation Ratios: 12 % — ABNORMAL LOW (ref 20–55)
TIBC: 239 ug/dL (ref 202–409)
UIBC: 209 ug/dL (ref 117–376)

## 2020-02-13 LAB — FERRITIN: Ferritin: 215 ng/mL (ref 24–336)

## 2020-02-13 MED ORDER — IOHEXOL 300 MG/ML  SOLN
100.0000 mL | Freq: Once | INTRAMUSCULAR | Status: AC | PRN
  Administered 2020-02-13: 100 mL via INTRAVENOUS

## 2020-02-13 NOTE — Telephone Encounter (Signed)
He called and left a message. His scan is scheduled for this afternoon. Thank you for seeing him and if you want to change return appt he would be glad to change.

## 2020-02-13 NOTE — Assessment & Plan Note (Signed)
According to the patient, his Crohn's disease is under control He will continue treatment for now I will assess with CT imaging

## 2020-02-13 NOTE — Telephone Encounter (Signed)
I can see him at 8 am on Friday, 20 mins Can you call him and get it changed?

## 2020-02-13 NOTE — Telephone Encounter (Signed)
Called back and given below message. He is agree to above time, instructed to arrive 15 mins early. He verbalized understanding.

## 2020-02-13 NOTE — Assessment & Plan Note (Signed)
He has intermittent fever and chills He had been tested for COVID-19 recently that was negative The patient has not been vaccinated and I encouraged him to get vaccinated in the near future As above, I will order some blood work and CT imaging for further assessment

## 2020-02-13 NOTE — Assessment & Plan Note (Signed)
The patient have diffuse lymphadenopathy affecting his axillary region, submandibular region as well as inguinal region He has active Crohn's disease and is on Humira The cause of the lymphadenopathy is either reactive versus malignant I will order some basic work-up for this Given his high risk situation, I would recommend CT imaging of the chest, abdomen and pelvis for further assessment He would likely need lymph node biopsy but I would like to assess the location of the best lymph node to recommend biopsy I will see him back next week for further follow-up

## 2020-02-13 NOTE — Progress Notes (Signed)
[facility] CONSULT NOTE  Patient Care Team: [name], MD as PCP - General (Family Medicine) [name], MD as Attending Physician (Family Medicine) [name], MD (Inactive) (Gastroenterology)  ASSESSMENT & PLAN:  Lymphadenopathy, axillary The patient have diffuse lymphadenopathy affecting his axillary region, submandibular region as well as inguinal region He has active Crohn's disease and is on Humira The cause of the lymphadenopathy is either reactive versus malignant I will order some basic work-up for this Given his high risk situation, I would recommend CT imaging of the chest, abdomen and pelvis for further assessment He would likely need lymph node biopsy but I would like to assess the location of the best lymph node to recommend biopsy I will see him back next week for further follow-up  Deficiency anemia He has history of iron deficiency anemia I will order some work-up for this including vitamin B12 level due to active Crohn's disease  Crohn disease ([place]) According to the patient, his Crohn's disease is under control He will continue treatment for now I will assess with CT imaging  Fever and chills He has intermittent fever and chills He had been tested for COVID-19 recently that was negative The patient has not been vaccinated and I encouraged him to get vaccinated in the near future As above, I will order some blood work and CT imaging for further assessment   Orders Placed This Encounter  Procedures  . CT CHEST ABDOMEN PELVIS W CONTRAST    Standing Status:   Future    Standing Expiration Date:   02/12/2021    Order Specific Question:   Reason for Exam (SYMPTOM  OR DIAGNOSIS REQUIRED)    Answer:   lymphadenopathy, crohn;'s weight loww, concern for lymphoma    Order Specific Question:   Preferred imaging location?    Answer:   [facility]    Order Specific Question:   Radiology Contrast Protocol - do NOT remove file path     Answer:   [email or web address]  . Comprehensive metabolic panel    Standing Status:   Standing    Number of Occurrences:   33    Standing Expiration Date:   02/12/2021  . CBC with Differential/Platelet    Standing Status:   Standing    Number of Occurrences:   22    Standing Expiration Date:   02/12/2021  . Lactate dehydrogenase    Standing Status:   Future    Number of Occurrences:   1    Standing Expiration Date:   02/12/2021  . Sedimentation rate    Standing Status:   Future    Number of Occurrences:   1    Standing Expiration Date:   02/12/2021  . HIV antibody (with reflex)    Standing Status:   Future    Number of Occurrences:   1    Standing Expiration Date:   02/12/2021  . Iron and TIBC    Standing Status:   Future    Standing Expiration Date:   02/12/2021  . Ferritin    Standing Status:   Future    Standing Expiration Date:   02/12/2021  . Vitamin B12    Standing Status:   Future    Standing Expiration Date:   02/12/2021    The total time spent in the appointment was 55 minutes encounter with patients including review of chart and various tests results, discussions about plan of care and coordination of care plan   All questions were  answered. The patient knows to call the clinic with any problems, questions or concerns. No barriers to learning was detected.  [name], MD 8/18/20219:21 AM  CHIEF COMPLAINTS/PURPOSE OF CONSULTATION:  Diffuse lymphadenopathy, history of anemia, active Crohn's disease and nonspecific fever and chills  HISTORY OF PRESENTING ILLNESS:  Mark Decker 24 y.o. male is here because of concern for lymphadenopathy The patient was last seen here 6 years ago for mild leukopenia and anemia that has since resolved Today, he is seen for different reason. He is billed as a new patient Since last time I saw him, he moved to [place] but then relocated back to [place] He was diagnosed with Crohn's disease and has  been getting active treatment with mesalamine and Humira According to the patient, he never developed severe complications such as bowel obstruction or fistula He has intermittent loose stool alternate with constipation He has occasional abdominal cramp with bowel movement He has not have any active flare of Crohn's disease recently Over the past 8 months or so, he noted some new lymphadenopathy in the jawline region He also palpated right axillary lymph node over the last 2 to 4 months He has been having some low-grade fever and chills ranging from 99 Fahrenheit to 101.6 with associated fatigue He denies sore throat, cough, chest pain or shortness of breath His appetite is stable He has intentional weight loss due to his active physical exercise He goes to the gym 5 times a week for strength training and cardio   MEDICAL HISTORY:  Past Medical History:  Diagnosis Date  . Anemia 04/26/2013  . Crohn disease ([place])   . Gastroesophageal reflux disease   . Iron deficiency anemia   . Left varicocele   . Lymphopenia 04/14/2012   HIV negative in 03/2012 per PCP.  Marland Kitchen Lymphopenia 04/27/2013  . Weight loss     SURGICAL HISTORY: Past Surgical History:  Procedure Laterality Date  . VARICOCELE EXCISION  2010    SOCIAL HISTORY: Social History   Socioeconomic History  . Marital status: Single    Spouse name: Not on file  . Number of children: 0  . Years of education: Not on file  . Highest education level: Not on file  Occupational History    Comment: [employer]; Psych  Tobacco Use  . Smoking status: Never Smoker  . Smokeless tobacco: Never Used  Vaping Use  . Vaping Use: Former  . Substances: Mixture of cannabinoids  Substance and Sexual Activity  . Alcohol use: No  . Drug use: No  . Sexual activity: Not on file  Other Topics Concern  . Not on file  Social History Narrative  . Not on file   Social Determinants of Health   Financial Resource Strain:   . Difficulty of  Paying Living Expenses:   Food Insecurity:   . Worried About Charity fundraiser in the Last Year:   . Arboriculturist in the Last Year:   Transportation Needs:   . Film/video editor (Medical):   Marland Kitchen Lack of Transportation (Non-Medical):   Physical Activity:   . Days of Exercise per Week:   . Minutes of Exercise per Session:   Stress:   . Feeling of Stress :   Social Connections:   . Frequency of Communication with Friends and Family:   . Frequency of Social Gatherings with Friends and Family:   . Attends Religious Services:   . Active Member of Clubs or Organizations:   . Attends  Club or Organization Meetings:   Marland Kitchen Marital Status:   Intimate Partner Violence:   . Fear of Current or Ex-Partner:   . Emotionally Abused:   Marland Kitchen Physically Abused:   . Sexually Abused:     FAMILY HISTORY: Family History  Problem Relation Age of Onset  . Arthritis Mother   . Crohn's disease Mother   . Hypertension Mother   . IgA nephropathy Mother   . Arthritis Father   . Migraines Father   . Eczema Father   . Cancer Maternal Aunt 60       colon  . Cancer Paternal Grandfather        colon  . Migraines Sister     ALLERGIES:  has No Known Allergies.  MEDICATIONS:  Current Outpatient Medications  Medication Sig Dispense Refill  . Adalimumab (HUMIRA) 40 MG/0.8ML PSKT Inject 40 mg into the skin every 14 (fourteen) days.     . Cyanocobalamin (VITAMIN B-12) 5000 MCG TBDP Take 5,000 mcg by mouth daily.    . mesalamine (LIALDA) 1.2 G EC tablet Take 4.8 g by mouth daily with breakfast.     . Multiple Vitamin (MULTIVITAMIN) tablet Take 1 tablet by mouth daily.    . Vitamin D, Cholecalciferol, 1000 UNITS CAPS Take 1,000 Units by mouth daily.     No current facility-administered medications for this visit.    REVIEW OF SYSTEMS:   Eyes: Denies blurriness of vision, double vision or watery eyes Ears, nose, mouth, throat, and face: Denies mucositis or sore throat Respiratory: Denies cough,  dyspnea or wheezes Cardiovascular: Denies palpitation, chest discomfort or lower extremity swelling Skin: Denies abnormal skin rashes Lymphatics: Denies new lymphadenopathy or easy bruising Neurological:Denies numbness, tingling or new weaknesses Behavioral/Psych: Mood is stable, no new changes  All other systems were reviewed with the patient and are negative.  PHYSICAL EXAMINATION: ECOG PERFORMANCE STATUS: 1 - Symptomatic but completely ambulatory  Vitals:   02/13/20 0843  BP: 131/77  Pulse: 84  Resp: 18  Temp: 98.4 F (36.9 C)  SpO2: 100%   Filed Weights   02/13/20 0843  Weight: 210 lb (95.3 kg)    GENERAL:alert, no distress and comfortable SKIN: skin color, texture, turgor are normal, no rashes or significant lesions EYES: normal, conjunctiva are pink and non-injected, sclera clear OROPHARYNX:no exudate, no erythema and lips, buccal mucosa, and tongue normal  NECK: supple, thyroid normal size, non-tender, without nodularity LYMPH: He has palpable lymphadenopathy in the neck, axilla and inguinal region LUNGS: clear to auscultation and percussion with normal breathing effort HEART: regular rate & rhythm and no murmurs and no lower extremity edema ABDOMEN:abdomen soft, non-tender and normal bowel sounds Musculoskeletal:no cyanosis of digits and no clubbing  PSYCH: alert & oriented x 3 with fluent speech NEURO: no focal motor/sensory deficits  LABORATORY DATA:  I have reviewed the data as listed Lab Results  Component Value Date   WBC 5.6 02/13/2020   HGB 12.3 (L) 02/13/2020   HCT 37.7 (L) 02/13/2020   MCV 82.5 02/13/2020   PLT 316 02/13/2020   No results for input(s): NA, K, CL, CO2, GLUCOSE, BUN, CREATININE, CALCIUM, GFRNONAA, GFRAA, PROT, ALBUMIN, AST, ALT, ALKPHOS, BILITOT, BILIDIR, IBILI in the last 8760 hours.  RADIOGRAPHIC STUDIES: I have personally reviewed the radiological images as listed and agreed with the findings in the report. US SOFT TISSUE HEAD &  NECK (NON-THYROID)  Result Date: 01/16/2020 CLINICAL DATA:  Lymphadenopathy of the head neck. EXAM: ULTRASOUND OF HEAD/NECK SOFT TISSUES TECHNIQUE: Ultrasound examination  of the head and neck soft tissues was performed in the area of clinical concern. COMPARISON:  None. FINDINGS: Sonographic evaluation of the neck demonstrates multiple enlarged cervical lymph nodes on the right. The largest measures approximately 1.7 cm in the short axis. Many of these lymph nodes demonstrate a persistent fatty hilum. There are no pathologically enlarged lymph nodes on the left. IMPRESSION: There are multiple enlarged right-sided cervical lymph nodes. Most of these lymph nodes demonstrate a normal fatty hilum. Given the lack of enlarged lymph nodes on the patient's left, these lymph nodes are favored to be reactive. However, a lymphoproliferative disorder is not entirely excluded. Electronically Signed   By: Constance Holster M.D.   On: 01/16/2020 19:45

## 2020-02-13 NOTE — Assessment & Plan Note (Signed)
He has history of iron deficiency anemia I will order some work-up for this including vitamin B12 level due to active Crohn's disease

## 2020-02-15 ENCOUNTER — Inpatient Hospital Stay (HOSPITAL_BASED_OUTPATIENT_CLINIC_OR_DEPARTMENT_OTHER): Payer: BC Managed Care – PPO | Admitting: Hematology and Oncology

## 2020-02-15 ENCOUNTER — Other Ambulatory Visit: Payer: Self-pay

## 2020-02-15 ENCOUNTER — Telehealth: Payer: Self-pay

## 2020-02-15 ENCOUNTER — Encounter: Payer: Self-pay | Admitting: Hematology and Oncology

## 2020-02-15 DIAGNOSIS — R590 Localized enlarged lymph nodes: Secondary | ICD-10-CM | POA: Diagnosis not present

## 2020-02-15 DIAGNOSIS — K50919 Crohn's disease, unspecified, with unspecified complications: Secondary | ICD-10-CM | POA: Diagnosis not present

## 2020-02-15 DIAGNOSIS — D539 Nutritional anemia, unspecified: Secondary | ICD-10-CM | POA: Diagnosis not present

## 2020-02-15 NOTE — Assessment & Plan Note (Signed)
CT imaging showed large supraclavicular lymphadenopathy but no other areas involved elsewhere Based on his risk profile, excisional lymph node biopsy is indicated I explained to the patient why fine-needle aspirate and biopsy is not adequate to diagnose or rule out lymphoma I will refer him to ENT surgeon for excisional lymph node biopsy I will see him back within 3 days of biopsy for further management

## 2020-02-15 NOTE — Assessment & Plan Note (Signed)
He has signs of iron deficiency anemia I recommend oral iron supplement I plan to recheck iron studies in his next visit and if the numbers are still low, we will proceed with intravenous iron infusion

## 2020-02-15 NOTE — Progress Notes (Signed)
[facility] OFFICE PROGRESS NOTE  Patient Care Team: [name], MD as PCP - General (Family Medicine) [name], MD as Attending Physician (Family Medicine) [name], MD (Inactive) (Gastroenterology)  ASSESSMENT & PLAN:  Cervical lymphadenopathy CT imaging showed large supraclavicular lymphadenopathy but no other areas involved elsewhere Based on his risk profile, excisional lymph node biopsy is indicated I explained to the patient why fine-needle aspirate and biopsy is not adequate to diagnose or rule out lymphoma I will refer him to ENT surgeon for excisional lymph node biopsy I will see him back within 3 days of biopsy for further management  Deficiency anemia He has signs of iron deficiency anemia I recommend oral iron supplement I plan to recheck iron studies in his next visit and if the numbers are still low, we will proceed with intravenous iron infusion  Crohn disease ([place]) He has active flare of Crohn's disease with passage of bright red blood per rectum x3 yesterday For now, he will continue his Crohn's disease management As above, we will continue iron supplement to avoid severe anemia If the lymph node biopsy rule out lymphoma, then I think he needs urgent evaluation to see his gastroenterologist for further management of his Crohn's disease   Orders Placed This Encounter  Procedures  . Ambulatory referral to ENT    Referral Priority:   Urgent    Referral Type:   Consultation    Referral Reason:   Specialty Services Required    Requested Specialty:   Otolaryngology    Number of Visits Requested:   1    All questions were answered. The patient knows to call the clinic with any problems, questions or concerns. The total time spent in the appointment was 30 minutes encounter with patients including review of chart and various tests results, discussions about plan of care and coordination of care plan   [name], MD 02/15/2020 10:12  AM  INTERVAL HISTORY: Please see below for problem oriented charting. He returns to review test results Since last time I saw him, he complained of fatigue He has passage of bright red blood per rectum x3 He showed me pictures that was taken after bowel movement He also complained of intermittent abdominal cramp He noticed no changes to the lymph node so far since have seen him  SUMMARY OF HISTORY: Mark Decker 24 y.o. male is here because of concern for lymphadenopathy The patient was last seen here 6 years ago for mild leukopenia and anemia that has since resolved Today, he is seen for different reason. He is billed as a new patient Since last time I saw him, he moved to [place] but then relocated back to [place] He was diagnosed with Crohn's disease and has been getting active treatment with mesalamine and Humira According to the patient, he never developed severe complications such as bowel obstruction or fistula He has intermittent loose stool alternate with constipation He has occasional abdominal cramp with bowel movement He has not have any active flare of Crohn's disease recently Over the past 8 months or so, he noted some new lymphadenopathy in the jawline region He also palpated right axillary lymph node over the last 2 to 4 months He has been having some low-grade fever and chills ranging from 99 Fahrenheit to 101.6 with associated fatigue He denies sore throat, cough, chest pain or shortness of breath His appetite is stable He has intentional weight loss due to his active physical exercise He goes to the gym  5 times a week for strength training and cardio CT imaging from August 18 showed lymphadenopathy in the head and neck region  REVIEW OF SYSTEMS:   Constitutional: Denies fevers, chills or abnormal weight loss Eyes: Denies blurriness of vision Ears, nose, mouth, throat, and face: Denies mucositis or sore throat Respiratory: Denies cough, dyspnea  or wheezes Cardiovascular: Denies palpitation, chest discomfort or lower extremity swelling Gastrointestinal:  Denies nausea, heartburn or change in bowel habits Skin: Denies abnormal skin rashes Lymphatics: Denies new lymphadenopathy or easy bruising Neurological:Denies numbness, tingling or new weaknesses Behavioral/Psych: Mood is stable, no new changes  All other systems were reviewed with the patient and are negative.  I have reviewed the past medical history, past surgical history, social history and family history with the patient and they are unchanged from previous note.  ALLERGIES:  has No Known Allergies.  MEDICATIONS:  Current Outpatient Medications  Medication Sig Dispense Refill  . Adalimumab (HUMIRA) 40 MG/0.8ML PSKT Inject 40 mg into the skin every 14 (fourteen) days.     . Cyanocobalamin (VITAMIN B-12) 5000 MCG TBDP Take 5,000 mcg by mouth daily.    . mesalamine (LIALDA) 1.2 G EC tablet Take 4.8 g by mouth daily with breakfast.     . Multiple Vitamin (MULTIVITAMIN) tablet Take 1 tablet by mouth daily.    . Vitamin D, Cholecalciferol, 1000 UNITS CAPS Take 1,000 Units by mouth daily.     No current facility-administered medications for this visit.    PHYSICAL EXAMINATION: ECOG PERFORMANCE STATUS: 1 - Symptomatic but completely ambulatory  Vitals:   02/15/20 0828  BP: 131/74  Pulse: (!) 105  Resp: 18  Temp: 99 F (37.2 C)  SpO2: 100%   Filed Weights   02/15/20 0828  Weight: 210 lb (95.3 kg)    GENERAL:alert, no distress and comfortable NEURO: alert & oriented x 3 with fluent speech, no focal motor/sensory deficits He has palpable lymph node in his neck that is accessible with biopsy  LABORATORY DATA:  I have reviewed the data as listed    Component Value Date/Time   NA 137 02/13/2020 0909   NA 141 04/27/2013 1217   K 4.2 02/13/2020 0909   K 4.0 04/27/2013 1217   CL 104 02/13/2020 0909   CL 106 04/26/2012 1550   CO2 26 02/13/2020 0909   CO2 28  04/27/2013 1217   GLUCOSE 102 (H) 02/13/2020 0909   GLUCOSE 92 04/27/2013 1217   GLUCOSE 108 (H) 04/26/2012 1550   BUN 14 02/13/2020 0909   BUN 10.2 04/27/2013 1217   CREATININE 0.85 02/13/2020 0909   CREATININE 0.8 04/27/2013 1217   CALCIUM 9.9 02/13/2020 0909   CALCIUM 9.7 04/27/2013 1217   PROT 8.2 (H) 02/13/2020 0909   PROT 7.5 04/27/2013 1217   ALBUMIN 3.3 (L) 02/13/2020 0909   ALBUMIN 3.6 04/27/2013 1217   AST 17 02/13/2020 0909   AST 14 04/27/2013 1217   ALT 26 02/13/2020 0909   ALT 23 04/27/2013 1217   ALKPHOS 64 02/13/2020 0909   ALKPHOS 64 04/27/2013 1217   BILITOT 0.4 02/13/2020 0909   BILITOT 0.86 04/27/2013 1217   GFRNONAA >60 02/13/2020 0909   GFRAA >60 02/13/2020 0909    No results found for: SPEP, UPEP  Lab Results  Component Value Date   WBC 5.6 02/13/2020   NEUTROABS 2.6 02/13/2020   HGB 12.3 (L) 02/13/2020   HCT 37.7 (L) 02/13/2020   MCV 82.5 02/13/2020   PLT 316 02/13/2020  Chemistry      Component Value Date/Time   NA 137 02/13/2020 0909   NA 141 04/27/2013 1217   K 4.2 02/13/2020 0909   K 4.0 04/27/2013 1217   CL 104 02/13/2020 0909   CL 106 04/26/2012 1550   CO2 26 02/13/2020 0909   CO2 28 04/27/2013 1217   BUN 14 02/13/2020 0909   BUN 10.2 04/27/2013 1217   CREATININE 0.85 02/13/2020 0909   CREATININE 0.8 04/27/2013 1217      Component Value Date/Time   CALCIUM 9.9 02/13/2020 0909   CALCIUM 9.7 04/27/2013 1217   ALKPHOS 64 02/13/2020 0909   ALKPHOS 64 04/27/2013 1217   AST 17 02/13/2020 0909   AST 14 04/27/2013 1217   ALT 26 02/13/2020 0909   ALT 23 04/27/2013 1217   BILITOT 0.4 02/13/2020 0909   BILITOT 0.86 04/27/2013 1217       RADIOGRAPHIC STUDIES: I have reviewed CT imaging with the patient I have personally reviewed the radiological images as listed and agreed with the findings in the report. US SOFT TISSUE HEAD & NECK (NON-THYROID)  Result Date: 01/16/2020 CLINICAL DATA:  Lymphadenopathy of the head neck.  EXAM: ULTRASOUND OF HEAD/NECK SOFT TISSUES TECHNIQUE: Ultrasound examination of the head and neck soft tissues was performed in the area of clinical concern. COMPARISON:  None. FINDINGS: Sonographic evaluation of the neck demonstrates multiple enlarged cervical lymph nodes on the right. The largest measures approximately 1.7 cm in the short axis. Many of these lymph nodes demonstrate a persistent fatty hilum. There are no pathologically enlarged lymph nodes on the left. IMPRESSION: There are multiple enlarged right-sided cervical lymph nodes. Most of these lymph nodes demonstrate a normal fatty hilum. Given the lack of enlarged lymph nodes on the patient's left, these lymph nodes are favored to be reactive. However, a lymphoproliferative disorder is not entirely excluded. Electronically Signed   By: Constance Holster M.D.   On: 01/16/2020 19:45   CT CHEST ABDOMEN PELVIS W CONTRAST  Result Date: 02/14/2020 CLINICAL DATA:  History of axillary adenopathy. Crohn disease. Weight loss. Concern for lymphoma. EXAM: CT CHEST, ABDOMEN, AND PELVIS WITH CONTRAST TECHNIQUE: Multidetector CT imaging of the chest, abdomen and pelvis was performed following the standard protocol during bolus administration of intravenous contrast. CONTRAST:  133m OMNIPAQUE IOHEXOL 300 MG/ML  SOLN COMPARISON:  Neck ultrasound of 01/16/2020. Abdominopelvic CT of 05/13/2015. no prior chest CT. FINDINGS: CT CHEST FINDINGS Cardiovascular: Normal aortic caliber. Normal heart size, without pericardial effusion. No central pulmonary embolism, on this non-dedicated study. Mediastinum/Nodes: Multiple enlarged low right jugular/supraclavicular nodes. Example node of 1.5 x 2.1 cm on 03/03 No axillary adenopathy. No mediastinal or hilar adenopathy. Tiny hiatal hernia. Lungs/Pleura: No pleural fluid. Probable subpleural lymph node along the right minor fissure at 4 mm on 95/2. Minimal motion degradation inferiorly. A subpleural left lower lobe pulmonary  nodule of 4 mm on 123/2 is also favored to represent a subpleural lymph node. Musculoskeletal: No acute osseous abnormality. CT ABDOMEN PELVIS FINDINGS Hepatobiliary: Mild motion degradation, continuing into the upper abdomen. Normal liver. Normal gallbladder, without biliary ductal dilatation. Pancreas: Normal, without mass or ductal dilatation. Spleen: Normal in size, without focal abnormality. Adrenals/Urinary Tract: Normal adrenal glands. Normal kidneys, without hydronephrosis. Normal urinary bladder. Stomach/Bowel: Normal remainder of the stomach. Underdistended rectum and sigmoid. Normal terminal ileum and appendix. Normal small bowel. Vascular/Lymphatic: Normal caliber of the aorta and branch vessels. Small abdominal retroperitoneal and small bowel mesenteric nodes. None pathologic by size criteria. No pelvic  sidewall adenopathy. Reproductive: Normal prostate. Other: No significant free fluid. No evidence of omental or peritoneal disease. Musculoskeletal: No acute osseous abnormality. IMPRESSION: 1. Low right cervical adenopathy, suspicious for lymphoproliferative process. Consider dedicated neck CT. 2. No adenopathy within the chest, abdomen, or pelvis. 3. Minimal motion degradation throughout the lower chest and abdomen. 4.  Tiny hiatal hernia. 5. No evidence of active Crohn disease. Electronically Signed   By: Abigail Miyamoto M.D.   On: 02/14/2020 08:44

## 2020-02-15 NOTE — Assessment & Plan Note (Signed)
He has active flare of Crohn's disease with passage of bright red blood per rectum x3 yesterday For now, he will continue his Crohn's disease management As above, we will continue iron supplement to avoid severe anemia If the lymph node biopsy rule out lymphoma, then I think he needs urgent evaluation to see his gastroenterologist for further management of his Crohn's disease

## 2020-02-15 NOTE — Telephone Encounter (Signed)
-----   Message from [name], MD sent at 02/15/2020 10:15 AM EDT ----- Regarding: urgent referral to Dr. Radene Journey I just finished my notes Can you call his office for appt? Give the office my cell number in case Dr. Lucia Gaskins needs to reach me

## 2020-02-15 NOTE — Telephone Encounter (Signed)
Called Dr. Pollie Friar office with the referral. Appt scheduled for 8/31 at 3:45 p. Called and given appt details to [place] with office phone # and address of Dr. Lucia Gaskins. He verbalized understanding.

## 2020-02-20 ENCOUNTER — Ambulatory Visit: Payer: BC Managed Care – PPO | Admitting: Hematology and Oncology

## 2020-02-20 ENCOUNTER — Other Ambulatory Visit (HOSPITAL_COMMUNITY): Payer: BC Managed Care – PPO

## 2020-02-26 ENCOUNTER — Ambulatory Visit (INDEPENDENT_AMBULATORY_CARE_PROVIDER_SITE_OTHER): Payer: BC Managed Care – PPO | Admitting: Otolaryngology

## 2020-02-26 ENCOUNTER — Encounter (INDEPENDENT_AMBULATORY_CARE_PROVIDER_SITE_OTHER): Payer: Self-pay | Admitting: Otolaryngology

## 2020-02-26 ENCOUNTER — Other Ambulatory Visit: Payer: Self-pay

## 2020-02-26 VITALS — Temp 97.2°F

## 2020-02-26 DIAGNOSIS — R590 Localized enlarged lymph nodes: Secondary | ICD-10-CM | POA: Diagnosis not present

## 2020-02-26 NOTE — Progress Notes (Signed)
HPI: Mark Decker is a 24 y.o. male who presents is referred by his PCP as well as [name] for evaluation of right cervical lymphadenopathy.  [name] recommended a lymph node biopsy.  He apparently first noticed a lump under his right jaw about 6 months ago.  It apparently fluctuated in size.  He has had subsequent ultrasound as well as MRI scan that shows multiple lymphadenopathy which is worse on the right side of his neck.  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 has recommended an lymph node biopsy. Patient has history of Crohn's disease and takes Humira for this. He has an allergy to amoxicillin..  Past Medical History:  Diagnosis Date  . Anemia 04/26/2013  . Crohn disease ([place])   . Gastroesophageal reflux disease   . Iron deficiency anemia   . Left varicocele   . Lymphopenia 04/14/2012   HIV negative in 03/2012 per PCP.  Marland Kitchen Lymphopenia 04/27/2013  . Weight loss    Past Surgical History:  Procedure Laterality Date  . VARICOCELE EXCISION  2010   Social History   Socioeconomic History  . Marital status: Single    Spouse name: Not on file  . Number of children: 0  . Years of education: Not on file  . Highest education level: Not on file  Occupational History    Comment: [employer]; Psych  Tobacco Use  . Smoking status: Never Smoker  . Smokeless tobacco: Never Used  Vaping Use  . Vaping Use: Former  . Substances: Mixture of cannabinoids  Substance and Sexual Activity  . Alcohol use: No  . Drug use: No  . Sexual activity: Not on file  Other Topics Concern  . Not on file  Social History Narrative  . Not on file   Social Determinants of Health   Financial Resource Strain:   . Difficulty of Paying Living Expenses: Not on file  Food Insecurity:   . Worried About Charity fundraiser in the Last Year: Not on file  . Ran Out of Food in the Last Year: Not on file  Transportation Needs:   . Lack of Transportation  (Medical): Not on file  . Lack of Transportation (Non-Medical): Not on file  Physical Activity:   . Days of Exercise per Week: Not on file  . Minutes of Exercise per Session: Not on file  Stress:   . Feeling of Stress : Not on file  Social Connections:   . Frequency of Communication with Friends and Family: Not on file  . Frequency of Social Gatherings with Friends and Family: Not on file  . Attends Religious Services: Not on file  . Active Member of Clubs or Organizations: Not on file  . Attends Archivist Meetings: Not on file  . Marital Status: Not on file   Family History  Problem Relation Age of Onset  . Arthritis Mother   . Crohn's disease Mother   . Hypertension Mother   . IgA nephropathy Mother   . Arthritis Father   . Migraines Father   . Eczema Father   . Cancer Maternal Aunt 60       colon  . Cancer Paternal Grandfather        colon  . Migraines Sister    Allergies  Allergen Reactions  . Amoxicillin Anaphylaxis   Prior to Admission medications   Medication Sig Start Date End Date Taking? Authorizing Provider  Adalimumab (HUMIRA) 40 MG/0.8ML PSKT Inject 40 mg into the skin  every 14 (fourteen) days.    Yes [provider]  Cyanocobalamin (VITAMIN B-12) 5000 MCG TBDP Take 5,000 mcg by mouth daily.   Yes [provider]  mesalamine (LIALDA) 1.2 G EC tablet Take 4.8 g by mouth daily with breakfast.    Yes [provider]  Multiple Vitamin (MULTIVITAMIN) tablet Take 1 tablet by mouth daily.   Yes [provider]  Vitamin D, Cholecalciferol, 1000 UNITS CAPS Take 1,000 Units by mouth daily.   Yes [provider]     Positive ROS: Otherwise negative.  He has had intermittent fever.  All other systems have been reviewed and were otherwise negative with the exception of those mentioned in the HPI and as above.  Physical Exam: Constitutional: Alert, well-appearing, no acute distress Ears: External ears without  lesions or tenderness. Ear canals are clear bilaterally with intact, clear TMs.  Nasal: External nose without lesions Clear nasal passages Oral: Lips and gums without lesions. Tongue and palate mucosa without lesions. Posterior oropharynx clear.  Tonsils appear benign and symmetric bilaterally Neck: Patient has multiple lymphadenopathy which is worse on the right side of the lower neck in the supra clavicular area behind the sternocleidomastoid muscle.  He does have a lymph node higher in the neck in the submandibular area just posterior to the submandibular gland.  He states that this was one of the first nodes that enlarged.  He has an approximate 1-1/2 to 2 cm submandibular lymph node just below the mandible in the submandibular fossa.  This is in the region of the marginal nerve. Respiratory: Breathing comfortably  Skin: No facial/neck lesions or rash noted.  Procedures  Assessment: Right neck lymphadenopathy.  Plan: Discussed with Jalik concerning excisional biopsy of the lymph node in the right submandibular region. We will plan on scheduling this in about 2 weeks and is tentatively scheduled for 03/10/2020   Radene Journey, MD   CC:

## 2020-02-29 ENCOUNTER — Encounter (HOSPITAL_BASED_OUTPATIENT_CLINIC_OR_DEPARTMENT_OTHER): Payer: Self-pay | Admitting: Otolaryngology

## 2020-02-29 ENCOUNTER — Other Ambulatory Visit: Payer: Self-pay

## 2020-03-06 ENCOUNTER — Ambulatory Visit (INDEPENDENT_AMBULATORY_CARE_PROVIDER_SITE_OTHER): Payer: Self-pay | Admitting: Otolaryngology

## 2020-03-06 ENCOUNTER — Inpatient Hospital Stay (HOSPITAL_COMMUNITY): Admission: RE | Admit: 2020-03-06 | Payer: BC Managed Care – PPO | Source: Ambulatory Visit

## 2020-03-06 DIAGNOSIS — R591 Generalized enlarged lymph nodes: Secondary | ICD-10-CM

## 2020-03-06 NOTE — H&P (Signed)
PREOPERATIVE H&P  Chief Complaint: Chronically enlarged right neck nodes  HPI: Mark Decker is a 24 y.o. male who presents for evaluation of chronically enlarged right neck nodes.  On exam with [name] the chronically enlarged right neck nodes are consistent with a lymphoproliferative disorder and she recommended an excisional biopsy of one of the nodes.  Patient is taken to the operating room at this time for excisional biopsy of a upper right neck node.  Past Medical History:  Diagnosis Date  . Anemia 04/26/2013  . Crohn disease ([place])   . Gastroesophageal reflux disease   . Iron deficiency anemia   . Left varicocele   . Lymphopenia 04/14/2012   HIV negative in 03/2012 per PCP.  Marland Kitchen Lymphopenia 04/27/2013  . Weight loss    Past Surgical History:  Procedure Laterality Date  . VARICOCELE EXCISION  2010   Social History   Socioeconomic History  . Marital status: Single    Spouse name: Not on file  . Number of children: 0  . Years of education: Not on file  . Highest education level: Not on file  Occupational History    Comment: [employer]; Psych  Tobacco Use  . Smoking status: Never Smoker  . Smokeless tobacco: Never Used  Vaping Use  . Vaping Use: Former  . Substances: Mixture of cannabinoids  Substance and Sexual Activity  . Alcohol use: No  . Drug use: No  . Sexual activity: Not on file  Other Topics Concern  . Not on file  Social History Narrative  . Not on file   Social Determinants of Health   Financial Resource Strain:   . Difficulty of Paying Living Expenses: Not on file  Food Insecurity:   . Worried About Charity fundraiser in the Last Year: Not on file  . Ran Out of Food in the Last Year: Not on file  Transportation Needs:   . Lack of Transportation (Medical): Not on file  . Lack of Transportation (Non-Medical): Not on file  Physical Activity:   . Days of Exercise per Week: Not on file  . Minutes of Exercise per Session: Not on  file  Stress:   . Feeling of Stress : Not on file  Social Connections:   . Frequency of Communication with Friends and Family: Not on file  . Frequency of Social Gatherings with Friends and Family: Not on file  . Attends Religious Services: Not on file  . Active Member of Clubs or Organizations: Not on file  . Attends Archivist Meetings: Not on file  . Marital Status: Not on file   Family History  Problem Relation Age of Onset  . Arthritis Mother   . Crohn's disease Mother   . Hypertension Mother   . IgA nephropathy Mother   . Arthritis Father   . Migraines Father   . Eczema Father   . Cancer Maternal Aunt 60       colon  . Cancer Paternal Grandfather        colon  . Migraines Sister    Allergies  Allergen Reactions  . Amoxicillin Anaphylaxis   Prior to Admission medications   Medication Sig Start Date End Date Taking? Authorizing Provider  Adalimumab (HUMIRA) 40 MG/0.8ML PSKT Inject 40 mg into the skin every 14 (fourteen) days.     [provider]  Cyanocobalamin (VITAMIN B-12) 5000 MCG TBDP Take 5,000 mcg by mouth daily.    [provider]  magnesium chloride (SLOW-MAG) 64 MG  TBEC SR tablet Take by mouth.    [provider]  mesalamine (LIALDA) 1.2 G EC tablet Take 4.8 g by mouth daily with breakfast.     [provider]  Multiple Vitamin (MULTIVITAMIN) tablet Take 1 tablet by mouth daily.    [provider]  Multiple Vitamins-Iron (MULTIVITAMINS WITH IRON) TABS tablet Take 1 tablet by mouth daily.    [provider]  Multiple Vitamins-Minerals (ZINC PO) Take by mouth.    [provider]  Vitamin D, Cholecalciferol, 1000 UNITS CAPS Take 1,000 Units by mouth daily.    [provider]     Positive ROS: Otherwise negative  All other systems have been reviewed and were otherwise negative with the exception of those mentioned in the HPI and as above.  Physical Exam: There were no vitals  filed for this visit.  General: Alert, no acute distress Oral: Normal oral mucosa and tonsils Nasal: Clear nasal passages Neck: Patient with several enlarged deep neck nodes deep to the sternocleidomastoid muscle as well as an enlarged upper submandibular node just behind the submandibular gland. Ear: Ear canal is clear with normal appearing TMs Cardiovascular: Regular rate and rhythm, no murmur.  Respiratory: Clear to auscultation Neurologic: Alert and oriented x 3   Assessment/Plan: Multiple right neck lymphadenopathy  Plan for excisional biopsy of upper right neck node.   Melony Overly, MD 03/06/2020 10:30 AM

## 2020-03-06 NOTE — H&P (View-Only) (Signed)
PREOPERATIVE H&P  Chief Complaint: Chronically enlarged right neck nodes  HPI: Mark Decker is a 24 y.o. male who presents for evaluation of chronically enlarged right neck nodes.  On exam with [name] the chronically enlarged right neck nodes are consistent with a lymphoproliferative disorder and she recommended an excisional biopsy of one of the nodes.  Patient is taken to the operating room at this time for excisional biopsy of a upper right neck node.  Past Medical History:  Diagnosis Date  . Anemia 04/26/2013  . Crohn disease ([place])   . Gastroesophageal reflux disease   . Iron deficiency anemia   . Left varicocele   . Lymphopenia 04/14/2012   HIV negative in 03/2012 per PCP.  Marland Kitchen Lymphopenia 04/27/2013  . Weight loss    Past Surgical History:  Procedure Laterality Date  . VARICOCELE EXCISION  2010   Social History   Socioeconomic History  . Marital status: Single    Spouse name: Not on file  . Number of children: 0  . Years of education: Not on file  . Highest education level: Not on file  Occupational History    Comment: [employer]; Psych  Tobacco Use  . Smoking status: Never Smoker  . Smokeless tobacco: Never Used  Vaping Use  . Vaping Use: Former  . Substances: Mixture of cannabinoids  Substance and Sexual Activity  . Alcohol use: No  . Drug use: No  . Sexual activity: Not on file  Other Topics Concern  . Not on file  Social History Narrative  . Not on file   Social Determinants of Health   Financial Resource Strain:   . Difficulty of Paying Living Expenses: Not on file  Food Insecurity:   . Worried About Charity fundraiser in the Last Year: Not on file  . Ran Out of Food in the Last Year: Not on file  Transportation Needs:   . Lack of Transportation (Medical): Not on file  . Lack of Transportation (Non-Medical): Not on file  Physical Activity:   . Days of Exercise per Week: Not on file  . Minutes of Exercise per Session: Not on  file  Stress:   . Feeling of Stress : Not on file  Social Connections:   . Frequency of Communication with Friends and Family: Not on file  . Frequency of Social Gatherings with Friends and Family: Not on file  . Attends Religious Services: Not on file  . Active Member of Clubs or Organizations: Not on file  . Attends Archivist Meetings: Not on file  . Marital Status: Not on file   Family History  Problem Relation Age of Onset  . Arthritis Mother   . Crohn's disease Mother   . Hypertension Mother   . IgA nephropathy Mother   . Arthritis Father   . Migraines Father   . Eczema Father   . Cancer Maternal Aunt 60       colon  . Cancer Paternal Grandfather        colon  . Migraines Sister    Allergies  Allergen Reactions  . Amoxicillin Anaphylaxis   Prior to Admission medications   Medication Sig Start Date End Date Taking? Authorizing Provider  Adalimumab (HUMIRA) 40 MG/0.8ML PSKT Inject 40 mg into the skin every 14 (fourteen) days.     [provider]  Cyanocobalamin (VITAMIN B-12) 5000 MCG TBDP Take 5,000 mcg by mouth daily.    [provider]  magnesium chloride (SLOW-MAG) 64 MG  TBEC SR tablet Take by mouth.    [provider]  mesalamine (LIALDA) 1.2 G EC tablet Take 4.8 g by mouth daily with breakfast.     [provider]  Multiple Vitamin (MULTIVITAMIN) tablet Take 1 tablet by mouth daily.    [provider]  Multiple Vitamins-Iron (MULTIVITAMINS WITH IRON) TABS tablet Take 1 tablet by mouth daily.    [provider]  Multiple Vitamins-Minerals (ZINC PO) Take by mouth.    [provider]  Vitamin D, Cholecalciferol, 1000 UNITS CAPS Take 1,000 Units by mouth daily.    [provider]     Positive ROS: Otherwise negative  All other systems have been reviewed and were otherwise negative with the exception of those mentioned in the HPI and as above.  Physical Exam: There were no vitals  filed for this visit.  General: Alert, no acute distress Oral: Normal oral mucosa and tonsils Nasal: Clear nasal passages Neck: Patient with several enlarged deep neck nodes deep to the sternocleidomastoid muscle as well as an enlarged upper submandibular node just behind the submandibular gland. Ear: Ear canal is clear with normal appearing TMs Cardiovascular: Regular rate and rhythm, no murmur.  Respiratory: Clear to auscultation Neurologic: Alert and oriented x 3   Assessment/Plan: Multiple right neck lymphadenopathy  Plan for excisional biopsy of upper right neck node.   Melony Overly, MD 03/06/2020 10:30 AM

## 2020-03-07 ENCOUNTER — Encounter: Payer: Self-pay | Admitting: Hematology and Oncology

## 2020-03-07 ENCOUNTER — Other Ambulatory Visit (HOSPITAL_COMMUNITY)
Admission: RE | Admit: 2020-03-07 | Discharge: 2020-03-07 | Disposition: A | Discharge status: Home / Self Care | Payer: 59 | Source: Ambulatory Visit | Attending: Otolaryngology | Admitting: Otolaryngology

## 2020-03-07 ENCOUNTER — Telehealth: Payer: Self-pay | Admitting: *Deleted

## 2020-03-07 ENCOUNTER — Other Ambulatory Visit: Payer: Self-pay | Admitting: Hematology and Oncology

## 2020-03-07 ENCOUNTER — Other Ambulatory Visit: Payer: Self-pay

## 2020-03-07 ENCOUNTER — Inpatient Hospital Stay: Payer: 59 | Attending: Hematology and Oncology | Admitting: Hematology and Oncology

## 2020-03-07 ENCOUNTER — Inpatient Hospital Stay: Payer: 59

## 2020-03-07 DIAGNOSIS — R590 Localized enlarged lymph nodes: Secondary | ICD-10-CM

## 2020-03-07 DIAGNOSIS — Z5111 Encounter for antineoplastic chemotherapy: Secondary | ICD-10-CM | POA: Diagnosis present

## 2020-03-07 DIAGNOSIS — K5090 Crohn's disease, unspecified, without complications: Secondary | ICD-10-CM | POA: Insufficient documentation

## 2020-03-07 DIAGNOSIS — C8190 Hodgkin lymphoma, unspecified, unspecified site: Secondary | ICD-10-CM | POA: Insufficient documentation

## 2020-03-07 DIAGNOSIS — R509 Fever, unspecified: Secondary | ICD-10-CM

## 2020-03-07 DIAGNOSIS — D509 Iron deficiency anemia, unspecified: Secondary | ICD-10-CM | POA: Diagnosis present

## 2020-03-07 DIAGNOSIS — Z20822 Contact with and (suspected) exposure to covid-19: Secondary | ICD-10-CM | POA: Diagnosis not present

## 2020-03-07 DIAGNOSIS — R5383 Other fatigue: Secondary | ICD-10-CM

## 2020-03-07 DIAGNOSIS — Z23 Encounter for immunization: Secondary | ICD-10-CM | POA: Diagnosis not present

## 2020-03-07 DIAGNOSIS — Z01812 Encounter for preprocedural laboratory examination: Secondary | ICD-10-CM | POA: Insufficient documentation

## 2020-03-07 DIAGNOSIS — R5381 Other malaise: Secondary | ICD-10-CM

## 2020-03-07 DIAGNOSIS — D539 Nutritional anemia, unspecified: Secondary | ICD-10-CM

## 2020-03-07 LAB — CBC WITH DIFFERENTIAL/PLATELET
Abs Immature Granulocytes: 0.03 10*3/uL (ref 0.00–0.07)
Basophils Absolute: 0 10*3/uL (ref 0.0–0.1)
Basophils Relative: 1 %
Eosinophils Absolute: 0.1 10*3/uL (ref 0.0–0.5)
Eosinophils Relative: 2 %
HCT: 36.7 % — ABNORMAL LOW (ref 39.0–52.0)
Hemoglobin: 11.7 g/dL — ABNORMAL LOW (ref 13.0–17.0)
Immature Granulocytes: 0 %
Lymphocytes Relative: 22 %
Lymphs Abs: 1.7 10*3/uL (ref 0.7–4.0)
MCH: 25.7 pg — ABNORMAL LOW (ref 26.0–34.0)
MCHC: 31.9 g/dL (ref 30.0–36.0)
MCV: 80.5 fL (ref 80.0–100.0)
Monocytes Absolute: 2.4 10*3/uL — ABNORMAL HIGH (ref 0.1–1.0)
Monocytes Relative: 30 %
Neutro Abs: 3.7 10*3/uL (ref 1.7–7.7)
Neutrophils Relative %: 45 %
Platelets: 304 10*3/uL (ref 150–400)
RBC: 4.56 MIL/uL (ref 4.22–5.81)
RDW: 15.3 % (ref 11.5–15.5)
WBC: 8 10*3/uL (ref 4.0–10.5)
nRBC: 0 % (ref 0.0–0.2)

## 2020-03-07 LAB — COMPREHENSIVE METABOLIC PANEL
ALT: 45 U/L — ABNORMAL HIGH (ref 0–44)
AST: 30 U/L (ref 15–41)
Albumin: 3.3 g/dL — ABNORMAL LOW (ref 3.5–5.0)
Alkaline Phosphatase: 80 U/L (ref 38–126)
Anion gap: 9 (ref 5–15)
BUN: 14 mg/dL (ref 6–20)
CO2: 25 mmol/L (ref 22–32)
Calcium: 9.2 mg/dL (ref 8.9–10.3)
Chloride: 98 mmol/L (ref 98–111)
Creatinine, Ser: 0.97 mg/dL (ref 0.61–1.24)
GFR calc Af Amer: >60 mL/min (ref >60)
GFR calc non Af Amer: >60 mL/min (ref >60)
Glucose, Bld: 95 mg/dL (ref 70–99)
Potassium: 3.9 mmol/L (ref 3.5–5.1)
Sodium: 132 mmol/L — ABNORMAL LOW (ref 135–145)
Total Bilirubin: 0.7 mg/dL (ref 0.3–1.2)
Total Protein: 8.1 g/dL (ref 6.5–8.1)

## 2020-03-07 LAB — FERRITIN: Ferritin: 277 ng/mL (ref 24–336)

## 2020-03-07 LAB — IRON AND TIBC
Iron: 13 ug/dL — ABNORMAL LOW (ref 42–163)
Saturation Ratios: 6 % — ABNORMAL LOW (ref 20–55)
TIBC: 236 ug/dL (ref 202–409)
UIBC: 223 ug/dL (ref 117–376)

## 2020-03-07 LAB — TSH: TSH: 0.548 u[IU]/mL (ref 0.320–4.118)

## 2020-03-07 LAB — T4, FREE: Free T4: 1.16 ng/dL — ABNORMAL HIGH (ref 0.61–1.12)

## 2020-03-07 LAB — SARS CORONAVIRUS 2 (TAT 6-24 HRS): SARS Coronavirus 2: NEGATIVE

## 2020-03-07 NOTE — Progress Notes (Signed)
[facility] OFFICE PROGRESS NOTE  Patient Care Team: [name], MD as PCP - General (Family Medicine) [name], MD as Attending Physician (Family Medicine) [name], MD (Inactive) (Gastroenterology)  ASSESSMENT & PLAN:  Iron deficiency anemia, unspecified He has signs of iron deficiency anemia despite oral iron supplement I recommend intravenous iron infusion in the future  Fever and chills He has recurrent fever and chills likely secondary to his underlying autoimmune condition and possible undiagnosed malignancy I will see him next week for further follow-up  Other fatigue He has excessive fatigue likely secondary to ongoing disease process His TSH is within normal limits  Physical debility He is profoundly fatigue and debilitated by his ongoing medical condition I recommend consideration to take time off work and to see him next week   No orders of the defined types were placed in this encounter.   All questions were answered. The patient knows to call the clinic with any problems, questions or concerns. The total time spent in the appointment was 20 minutes encounter with patients including review of chart and various tests results, discussions about plan of care and coordination of care plan   [name], MD 03/07/2020 2:55 PM  INTERVAL HISTORY: Please see below for problem oriented charting. He is seen urgently as he is not feeling well He has excessive fatigue Continues to have recurrent fevers His lymph node on the base of his right neck continues to enlarge He denies pain  SUMMARY OF ONCOLOGIC HISTORY: Mark Decker 24 y.o. male is here because of concern for lymphadenopathy The patient was last seen here 6 years ago for mild leukopenia and anemia that has since resolved Today, he is seen for different reason. He is billed as a new patient Since last time I saw him, he moved to [place] but then relocated back to [place] He was diagnosed with Crohn's disease and has been getting active treatment with mesalamine and Humira According to the patient, he never developed severe complications such as bowel obstruction or fistula He has intermittent loose stool alternate with constipation He has occasional abdominal cramp with bowel movement He has not have any active flare of Crohn's disease recently Over the past 8 months or so, he noted some new lymphadenopathy in the jawline region He also palpated right axillary lymph node over the last 2 to 4 months He has been having some low-grade fever and chills ranging from 99 Fahrenheit to 101.6 with associated fatigue He denies sore throat, cough, chest pain or shortness of breath His appetite is stable He has intentional weight loss due to his active physical exercise He goes to the gym 5 times a week for strength training and cardio CT imaging from August 18 showed lymphadenopathy in the head and neck region  REVIEW OF SYSTEMS:   Eyes: Denies blurriness of vision Ears, nose, mouth, throat, and face: Denies mucositis or sore throat Respiratory: Denies cough, dyspnea or wheezes Cardiovascular: Denies palpitation, chest discomfort or lower extremity swelling Gastrointestinal:  Denies nausea, heartburn or change in bowel habits Skin: Denies abnormal skin rashes Neurological:Denies numbness, tingling or new weaknesses Behavioral/Psych: Mood is stable, no new changes  All other systems were reviewed with the patient and are negative.  I have reviewed the past medical history, past surgical history, social history and family history with the patient and they are unchanged from previous note.  ALLERGIES:  is allergic to amoxicillin.  MEDICATIONS:  Current Outpatient Medications  Medication Sig Dispense  Refill  . Adalimumab (HUMIRA) 40 MG/0.8ML PSKT Inject 40 mg into the skin every 14 (fourteen) days.     . Cyanocobalamin (VITAMIN B-12) 5000 MCG TBDP Take  5,000 mcg by mouth daily.    . magnesium chloride (SLOW-MAG) 64 MG TBEC SR tablet Take by mouth.    . mesalamine (LIALDA) 1.2 G EC tablet Take 4.8 g by mouth daily with breakfast.     . Multiple Vitamin (MULTIVITAMIN) tablet Take 1 tablet by mouth daily.    . Multiple Vitamins-Iron (MULTIVITAMINS WITH IRON) TABS tablet Take 1 tablet by mouth daily.    . Multiple Vitamins-Minerals (ZINC PO) Take by mouth.    . Vitamin D, Cholecalciferol, 1000 UNITS CAPS Take 1,000 Units by mouth daily.     No current facility-administered medications for this visit.    PHYSICAL EXAMINATION: ECOG PERFORMANCE STATUS: 1 - Symptomatic but completely ambulatory  Vitals:   03/07/20 1402  BP: 126/76  Pulse: (!) 119  Resp: 18  Temp: (!) 101.3 F (38.5 C)  SpO2: 100%   Filed Weights   03/07/20 1402  Weight: 213 lb 6.4 oz (96.8 kg)    GENERAL:alert, no distress and comfortable NECK: He has persistent enlarged lymph node at the base of his neck NEURO: alert & oriented x 3 with fluent speech, no focal motor/sensory deficits  LABORATORY DATA:  I have reviewed the data as listed    Component Value Date/Time   NA 132 (L) 03/07/2020 1342   NA 141 04/27/2013 1217   K 3.9 03/07/2020 1342   K 4.0 04/27/2013 1217   CL 98 03/07/2020 1342   CL 106 04/26/2012 1550   CO2 25 03/07/2020 1342   CO2 28 04/27/2013 1217   GLUCOSE 95 03/07/2020 1342   GLUCOSE 92 04/27/2013 1217   GLUCOSE 108 (H) 04/26/2012 1550   BUN 14 03/07/2020 1342   BUN 10.2 04/27/2013 1217   CREATININE 0.97 03/07/2020 1342   CREATININE 0.8 04/27/2013 1217   CALCIUM 9.2 03/07/2020 1342   CALCIUM 9.7 04/27/2013 1217   PROT 8.1 03/07/2020 1342   PROT 7.5 04/27/2013 1217   ALBUMIN 3.3 (L) 03/07/2020 1342   ALBUMIN 3.6 04/27/2013 1217   AST 30 03/07/2020 1342   AST 14 04/27/2013 1217   ALT 45 (H) 03/07/2020 1342   ALT 23 04/27/2013 1217   ALKPHOS 80 03/07/2020 1342   ALKPHOS 64 04/27/2013 1217   BILITOT 0.7 03/07/2020 1342   BILITOT  0.86 04/27/2013 1217   GFRNONAA >60 03/07/2020 1342   GFRAA >60 03/07/2020 1342    No results found for: SPEP, UPEP  Lab Results  Component Value Date   WBC 8.0 03/07/2020   NEUTROABS 3.7 03/07/2020   HGB 11.7 (L) 03/07/2020   HCT 36.7 (L) 03/07/2020   MCV 80.5 03/07/2020   PLT 304 03/07/2020      Chemistry      Component Value Date/Time   NA 132 (L) 03/07/2020 1342   NA 141 04/27/2013 1217   K 3.9 03/07/2020 1342   K 4.0 04/27/2013 1217   CL 98 03/07/2020 1342   CL 106 04/26/2012 1550   CO2 25 03/07/2020 1342   CO2 28 04/27/2013 1217   BUN 14 03/07/2020 1342   BUN 10.2 04/27/2013 1217   CREATININE 0.97 03/07/2020 1342   CREATININE 0.8 04/27/2013 1217      Component Value Date/Time   CALCIUM 9.2 03/07/2020 1342   CALCIUM 9.7 04/27/2013 1217   ALKPHOS 80 03/07/2020 1342  ALKPHOS 64 04/27/2013 1217   AST 30 03/07/2020 1342   AST 14 04/27/2013 1217   ALT 45 (H) 03/07/2020 1342   ALT 23 04/27/2013 1217   BILITOT 0.7 03/07/2020 1342   BILITOT 0.86 04/27/2013 1217

## 2020-03-07 NOTE — Assessment & Plan Note (Signed)
He has recurrent fever and chills likely secondary to his underlying autoimmune condition and possible undiagnosed malignancy I will see him next week for further follow-up

## 2020-03-07 NOTE — Assessment & Plan Note (Signed)
He is profoundly fatigue and debilitated by his ongoing medical condition I recommend consideration to take time off work and to see him next week

## 2020-03-07 NOTE — Telephone Encounter (Signed)
-----   Message from [name], MD sent at 03/07/2020  2:57 PM EDT ----- Regarding: labs Pls let him know iron tests are low, thyroid test ok I will schedule IV iron after I see him next week Continue oral iron for now

## 2020-03-07 NOTE — Assessment & Plan Note (Signed)
He has excessive fatigue likely secondary to ongoing disease process His TSH is within normal limits

## 2020-03-07 NOTE — Assessment & Plan Note (Deleted)
His lymph nodes are slightly enlarged compared to his last visit He is scheduled for surgical resection on Monday I will see him back next week for further follow-up and review of test results Differential diagnoses include possible lymphoma versus reactive secondary to his autoimmune condition

## 2020-03-07 NOTE — Progress Notes (Signed)
Spoke with patient, states he is going for covid test around 2 pm this afternoon.

## 2020-03-07 NOTE — Assessment & Plan Note (Signed)
He has signs of iron deficiency anemia despite oral iron supplement I recommend intravenous iron infusion in the future

## 2020-03-07 NOTE — Telephone Encounter (Signed)
Per Dr.Gorsuch, called to make pt aware of message below. Pt verbalized understanding.

## 2020-03-07 NOTE — Assessment & Plan Note (Addendum)
His lymph nodes are slightly enlarged compared to his last visit He is scheduled for surgical resection on Monday I will see him back next week for further follow-up and review of test results Differential diagnoses include possible lymphoma versus reactive secondary to his autoimmune condition

## 2020-03-10 ENCOUNTER — Ambulatory Visit (HOSPITAL_BASED_OUTPATIENT_CLINIC_OR_DEPARTMENT_OTHER)
Admission: RE | Admit: 2020-03-10 | Discharge: 2020-03-10 | Disposition: A | Discharge status: Home / Self Care | Payer: 59 | Attending: Otolaryngology | Admitting: Otolaryngology

## 2020-03-10 ENCOUNTER — Encounter (HOSPITAL_BASED_OUTPATIENT_CLINIC_OR_DEPARTMENT_OTHER)
Admission: RE | Disposition: A | Discharge status: Home / Self Care | Payer: Self-pay | Source: Home / Self Care | Attending: Otolaryngology

## 2020-03-10 ENCOUNTER — Other Ambulatory Visit: Payer: Self-pay

## 2020-03-10 ENCOUNTER — Encounter (HOSPITAL_BASED_OUTPATIENT_CLINIC_OR_DEPARTMENT_OTHER): Payer: Self-pay | Admitting: Otolaryngology

## 2020-03-10 ENCOUNTER — Ambulatory Visit (HOSPITAL_BASED_OUTPATIENT_CLINIC_OR_DEPARTMENT_OTHER): Payer: 59 | Admitting: Certified Registered"

## 2020-03-10 DIAGNOSIS — R590 Localized enlarged lymph nodes: Secondary | ICD-10-CM

## 2020-03-10 DIAGNOSIS — K5090 Crohn's disease, unspecified, without complications: Secondary | ICD-10-CM | POA: Diagnosis not present

## 2020-03-10 DIAGNOSIS — C8111 Nodular sclerosis classical Hodgkin lymphoma, lymph nodes of head, face, and neck: Secondary | ICD-10-CM | POA: Insufficient documentation

## 2020-03-10 DIAGNOSIS — Z79899 Other long term (current) drug therapy: Secondary | ICD-10-CM | POA: Diagnosis not present

## 2020-03-10 DIAGNOSIS — Z791 Long term (current) use of non-steroidal anti-inflammatories (NSAID): Secondary | ICD-10-CM | POA: Diagnosis not present

## 2020-03-10 DIAGNOSIS — R591 Generalized enlarged lymph nodes: Secondary | ICD-10-CM

## 2020-03-10 HISTORY — PX: MASS BIOPSY: SHX5445

## 2020-03-10 SURGERY — BIOPSY, MASS, NECK
Anesthesia: General | Site: Neck | Laterality: Right

## 2020-03-10 MED ORDER — LACTATED RINGERS IV SOLN: INTRAVENOUS | Status: DC

## 2020-03-10 MED ORDER — SUCCINYLCHOLINE CHLORIDE 20 MG/ML IJ SOLN
INTRAMUSCULAR | Status: DC | PRN
  Administered 2020-03-10: 120 mg via INTRAVENOUS

## 2020-03-10 MED ORDER — PROMETHAZINE HCL 25 MG/ML IJ SOLN: 6.2500 mg | INTRAMUSCULAR | Status: DC | PRN

## 2020-03-10 MED ORDER — 0.9 % SODIUM CHLORIDE (POUR BTL) OPTIME
TOPICAL | Status: DC | PRN
  Administered 2020-03-10: 1000 mL

## 2020-03-10 MED ORDER — VANCOMYCIN HCL 1000 MG IV SOLR
INTRAVENOUS | Status: DC | PRN
  Administered 2020-03-10: 1000 mg via INTRAVENOUS

## 2020-03-10 MED ORDER — LIDOCAINE-EPINEPHRINE 1 %-1:100000 IJ SOLN
INTRAMUSCULAR | Status: DC | PRN
  Administered 2020-03-10: 3 mL

## 2020-03-10 MED ORDER — OXYCODONE HCL 5 MG PO TABS
5.0000 mg | ORAL_TABLET | Freq: Once | ORAL | Status: AC | PRN
  Administered 2020-03-10: 5 mg via ORAL

## 2020-03-10 MED ORDER — DEXAMETHASONE SODIUM PHOSPHATE 4 MG/ML IJ SOLN
INTRAMUSCULAR | Status: DC | PRN
  Administered 2020-03-10: 10 mg via INTRAVENOUS

## 2020-03-10 MED ORDER — ROCURONIUM BROMIDE 10 MG/ML (PF) SYRINGE
PREFILLED_SYRINGE | INTRAVENOUS | Status: AC
  Filled 2020-03-10: qty 10

## 2020-03-10 MED ORDER — MEPERIDINE HCL 25 MG/ML IJ SOLN: 6.2500 mg | INTRAMUSCULAR | Status: DC | PRN

## 2020-03-10 MED ORDER — CHLORHEXIDINE GLUCONATE CLOTH 2 % EX PADS: 6.0000 | MEDICATED_PAD | Freq: Once | CUTANEOUS | Status: DC

## 2020-03-10 MED ORDER — LIDOCAINE-EPINEPHRINE 1 %-1:100000 IJ SOLN
INTRAMUSCULAR | Status: AC
  Filled 2020-03-10: qty 1

## 2020-03-10 MED ORDER — DEXAMETHASONE SODIUM PHOSPHATE 10 MG/ML IJ SOLN
INTRAMUSCULAR | Status: AC
  Filled 2020-03-10: qty 1

## 2020-03-10 MED ORDER — HYDROMORPHONE HCL 1 MG/ML IJ SOLN: 0.2500 mg | INTRAMUSCULAR | Status: DC | PRN

## 2020-03-10 MED ORDER — ONDANSETRON HCL 4 MG/2ML IJ SOLN
INTRAMUSCULAR | Status: AC
  Filled 2020-03-10: qty 2

## 2020-03-10 MED ORDER — LIDOCAINE 2% (20 MG/ML) 5 ML SYRINGE
INTRAMUSCULAR | Status: AC
  Filled 2020-03-10: qty 5

## 2020-03-10 MED ORDER — AMISULPRIDE (ANTIEMETIC) 5 MG/2ML IV SOLN: 10.0000 mg | Freq: Once | INTRAVENOUS | Status: DC | PRN

## 2020-03-10 MED ORDER — VANCOMYCIN HCL IN DEXTROSE 1-5 GM/200ML-% IV SOLN
1000.0000 mg | INTRAVENOUS | Status: AC
  Administered 2020-03-10: 1000 mg via INTRAVENOUS

## 2020-03-10 MED ORDER — FENTANYL CITRATE (PF) 100 MCG/2ML IJ SOLN
INTRAMUSCULAR | Status: AC
  Filled 2020-03-10: qty 2

## 2020-03-10 MED ORDER — ONDANSETRON HCL 4 MG/2ML IJ SOLN
INTRAMUSCULAR | Status: DC | PRN
  Administered 2020-03-10: 4 mg via INTRAVENOUS

## 2020-03-10 MED ORDER — MIDAZOLAM HCL 5 MG/5ML IJ SOLN
INTRAMUSCULAR | Status: DC | PRN
  Administered 2020-03-10: 2 mg via INTRAVENOUS

## 2020-03-10 MED ORDER — ROCURONIUM BROMIDE 100 MG/10ML IV SOLN
INTRAVENOUS | Status: DC | PRN
  Administered 2020-03-10: 10 mg via INTRAVENOUS

## 2020-03-10 MED ORDER — PROPOFOL 10 MG/ML IV BOLUS
INTRAVENOUS | Status: AC
  Filled 2020-03-10: qty 40

## 2020-03-10 MED ORDER — OXYCODONE HCL 5 MG/5ML PO SOLN: 5.0000 mg | Freq: Once | ORAL | Status: AC | PRN

## 2020-03-10 MED ORDER — OXYCODONE HCL 5 MG PO TABS
ORAL_TABLET | ORAL | Status: AC
  Filled 2020-03-10: qty 1

## 2020-03-10 MED ORDER — MIDAZOLAM HCL 2 MG/2ML IJ SOLN
INTRAMUSCULAR | Status: AC
  Filled 2020-03-10: qty 2

## 2020-03-10 MED ORDER — LIDOCAINE HCL (CARDIAC) PF 100 MG/5ML IV SOSY
PREFILLED_SYRINGE | INTRAVENOUS | Status: DC | PRN
  Administered 2020-03-10: 100 mg via INTRAVENOUS

## 2020-03-10 MED ORDER — VANCOMYCIN HCL IN DEXTROSE 1-5 GM/200ML-% IV SOLN
INTRAVENOUS | Status: AC
  Filled 2020-03-10: qty 200

## 2020-03-10 MED ORDER — FENTANYL CITRATE (PF) 100 MCG/2ML IJ SOLN
INTRAMUSCULAR | Status: DC | PRN
Start: 2020-03-10 — End: 2020-03-10
  Administered 2020-03-10: 100 ug via INTRAVENOUS

## 2020-03-10 MED ORDER — PROPOFOL 10 MG/ML IV BOLUS
INTRAVENOUS | Status: DC | PRN
  Administered 2020-03-10: 180 mg via INTRAVENOUS

## 2020-03-10 SURGICAL SUPPLY — 57 items
ADH SKN CLS APL DERMABOND .7 (GAUZE/BANDAGES/DRESSINGS) ×1
APL SKNCLS STERI-STRIP NONHPOA (GAUZE/BANDAGES/DRESSINGS)
BENZOIN TINCTURE PRP APPL 2/3 (GAUZE/BANDAGES/DRESSINGS) IMPLANT
BLADE SURG 15 STRL LF DISP TIS (BLADE) ×1 IMPLANT
BLADE SURG 15 STRL SS (BLADE) ×2
CANISTER SUCT 1200ML W/VALVE (MISCELLANEOUS) ×2 IMPLANT
CLEANER CAUTERY TIP 5X5 PAD (MISCELLANEOUS) IMPLANT
CORD BIPOLAR FORCEPS 12FT (ELECTRODE) IMPLANT
COVER BACK TABLE 60X90IN (DRAPES) ×2 IMPLANT
COVER MAYO STAND STRL (DRAPES) ×2 IMPLANT
COVER WAND RF STERILE (DRAPES) IMPLANT
DECANTER SPIKE VIAL GLASS SM (MISCELLANEOUS) IMPLANT
DERMABOND ADVANCED (GAUZE/BANDAGES/DRESSINGS) ×1
DERMABOND ADVANCED .7 DNX12 (GAUZE/BANDAGES/DRESSINGS) ×1 IMPLANT
DRAPE U-SHAPE 76X120 STRL (DRAPES) ×2 IMPLANT
ELECT COATED BLADE 2.86 ST (ELECTRODE) ×2 IMPLANT
ELECT REM PT RETURN 9FT ADLT (ELECTROSURGICAL) ×2
ELECTRODE REM PT RTRN 9FT ADLT (ELECTROSURGICAL) ×1 IMPLANT
GAUZE 4X4 16PLY RFD (DISPOSABLE) IMPLANT
GAUZE SPONGE 4X4 12PLY STRL LF (GAUZE/BANDAGES/DRESSINGS) IMPLANT
GLOVE SS BIOGEL STRL SZ 7.5 (GLOVE) ×1 IMPLANT
GLOVE SUPERSENSE BIOGEL SZ 7.5 (GLOVE) ×1
GOWN STRL REUS W/ TWL LRG LVL3 (GOWN DISPOSABLE) ×1 IMPLANT
GOWN STRL REUS W/TWL LRG LVL3 (GOWN DISPOSABLE) ×2
HEMOSTAT SURGICEL .5X2 ABSORB (HEMOSTASIS) IMPLANT
LOCATOR NERVE 3 VOLT (DISPOSABLE) IMPLANT
NEEDLE HYPO 25X1 1.5 SAFETY (NEEDLE) ×2 IMPLANT
NS IRRIG 1000ML POUR BTL (IV SOLUTION) ×2 IMPLANT
PACK BASIN DAY SURGERY FS (CUSTOM PROCEDURE TRAY) ×2 IMPLANT
PAD CLEANER CAUTERY TIP 5X5 (MISCELLANEOUS)
PENCIL SMOKE EVACUATOR (MISCELLANEOUS) ×2 IMPLANT
SLEEVE SCD COMPRESS KNEE MED (MISCELLANEOUS) ×2 IMPLANT
SPONGE INTESTINAL PEANUT (DISPOSABLE) IMPLANT
STRIP CLOSURE SKIN 1/2X4 (GAUZE/BANDAGES/DRESSINGS) IMPLANT
STRIP CLOSURE SKIN 1/4X4 (GAUZE/BANDAGES/DRESSINGS) IMPLANT
SUCTION FRAZIER HANDLE 10FR (MISCELLANEOUS)
SUCTION TUBE FRAZIER 10FR DISP (MISCELLANEOUS) IMPLANT
SUT CHROMIC 3 0 PS 2 (SUTURE) ×2 IMPLANT
SUT CHROMIC 3 0 SH 27 (SUTURE) IMPLANT
SUT ETHILON 4 0 PS 2 18 (SUTURE) IMPLANT
SUT ETHILON 5 0 P 3 18 (SUTURE) ×1
SUT NYLON ETHILON 5-0 P-3 1X18 (SUTURE) ×1 IMPLANT
SUT SILK 2 0 TIES 17X18 (SUTURE)
SUT SILK 2-0 18XBRD TIE BLK (SUTURE) IMPLANT
SUT SILK 3 0 SH 30 (SUTURE) IMPLANT
SUT SILK 3 0 TIES 17X18 (SUTURE) ×2
SUT SILK 3-0 18XBRD TIE BLK (SUTURE) ×1 IMPLANT
SUT VIC AB 5-0 P-3 18X BRD (SUTURE) IMPLANT
SUT VIC AB 5-0 P3 18 (SUTURE)
SUT VICRYL 6 0 P 1 18 (SUTURE) ×2 IMPLANT
SWAB COLLECTION DEVICE MRSA (MISCELLANEOUS) IMPLANT
SWAB CULTURE ESWAB REG 1ML (MISCELLANEOUS) IMPLANT
SYR BULB EAR ULCER 3OZ GRN STR (SYRINGE) ×2 IMPLANT
SYR CONTROL 10ML LL (SYRINGE) ×2 IMPLANT
TOWEL GREEN STERILE FF (TOWEL DISPOSABLE) ×4 IMPLANT
TRAY DSU PREP LF (CUSTOM PROCEDURE TRAY) ×2 IMPLANT
TUBE CONNECTING 20X1/4 (TUBING) ×2 IMPLANT

## 2020-03-10 NOTE — Anesthesia Procedure Notes (Signed)
Procedure Name: Intubation Performed by: Verita Lamb, CRNA Pre-anesthesia Checklist: Patient identified, Emergency Drugs available, Suction available and Patient being monitored Patient Re-evaluated:Patient Re-evaluated prior to induction Oxygen Delivery Method: Circle system utilized Preoxygenation: Pre-oxygenation with 100% oxygen Induction Type: IV induction Ventilation: Mask ventilation without difficulty Laryngoscope Size: Mac and 4 Grade View: Grade II Tube type: Oral Tube size: 7.0 mm Number of attempts: 1 Airway Equipment and Method: Stylet and Oral airway Placement Confirmation: ETT inserted through vocal cords under direct vision,  positive ETCO2 and breath sounds checked- equal and bilateral Secured at: 24 cm Tube secured with: Tape Dental Injury: Teeth and Oropharynx as per pre-operative assessment and Injury to lip  Comments: Easy mask ventilation, small nick to lower lip during airway manipulation, lubricant applied.  Teeth as preoperatively (noted before airway manipulation that the patient has an existing upper front tooth chipped.  BBS noted, tube secured.  Visualized epiglottis only at first and with cricoid noted glottic opening.  ett 7.0 placed easily. mkelly

## 2020-03-10 NOTE — Brief Op Note (Signed)
03/10/2020  8:59 AM  PATIENT:  Mark Decker  24 y.o. male  PRE-OPERATIVE DIAGNOSIS:  CERVICAL LYMPHADENOPATHY  POST-OPERATIVE DIAGNOSIS:  CERVICAL LYMPHADENOPATHY  PROCEDURE:  Procedure(s): EXCISIONAL RIGHT NECK MASS BIOPSY (Right)  SURGEON:  Surgeon(s) and Role:    Rozetta Nunnery, MD - Primary  PHYSICIAN ASSISTANT:   ASSISTANTS: none   ANESTHESIA:   general  EBL:  minimal   BLOOD ADMINISTERED:none  DRAINS: none   LOCAL MEDICATIONS USED:  XYLOCAINE  3 cc  SPECIMEN:  Source of Specimen:  right submandibular lymph node  DISPOSITION OF SPECIMEN:  PATHOLOGY  COUNTS:  YES  TOURNIQUET:  * No tourniquets in log *  DICTATION: .Other Dictation: Dictation Number [id number]  PLAN OF CARE: Discharge to home after PACU  PATIENT DISPOSITION:  PACU - hemodynamically stable.   Delay start of Pharmacological VTE agent (>24hrs) due to surgical blood loss or risk of bleeding: yes

## 2020-03-10 NOTE — Anesthesia Postprocedure Evaluation (Signed)
Anesthesia Post Note  Patient: Mark Decker  Procedure(s) Performed: EXCISIONAL RIGHT NECK MASS BIOPSY (Right Neck)     Patient location during evaluation: PACU Anesthesia Type: General Level of consciousness: awake and alert Pain management: pain level controlled Vital Signs Assessment: post-procedure vital signs reviewed and stable Respiratory status: spontaneous breathing, nonlabored ventilation and respiratory function stable Cardiovascular status: blood pressure returned to baseline and stable Postop Assessment: no apparent nausea or vomiting Anesthetic complications: no   No complications documented.  Last Vitals:  Vitals:   03/10/20 0930 03/10/20 0953  BP: 117/65 113/66  Pulse: 100 (!) 101  Resp: 12 14  Temp:  37.9 C  SpO2: 97% 100%    Last Pain:  Vitals:   03/10/20 0903  TempSrc:   PainSc: Asleep                 Lynda Rainwater

## 2020-03-10 NOTE — Transfer of Care (Signed)
Immediate Anesthesia Transfer of Care Note  Patient: Mark Decker  Procedure(s) Performed: EXCISIONAL RIGHT NECK MASS BIOPSY (Right Neck)  Patient Location: PACU  Anesthesia Type:General  Level of Consciousness: awake, alert  and oriented  Airway & Oxygen Therapy: Patient Spontanous Breathing and Patient connected to face mask oxygen  Post-op Assessment: Report given to RN and Post -op Vital signs reviewed and stable  Post vital signs: Reviewed and stable  Last Vitals:  Vitals Value Taken Time  BP 115/63 03/10/20 0903  Temp 38.9 C 03/10/20 0903  Pulse 102 03/10/20 0905  Resp 15 03/10/20 0905  SpO2 99 % 03/10/20 0905  Vitals shown include unvalidated device data.  Last Pain:  Vitals:   03/10/20 0652  TempSrc: Oral  PainSc: 5       Patients Stated Pain Goal: 5 (34/19/37 9024)  Complications: No complications documented.

## 2020-03-10 NOTE — Op Note (Signed)
NAMEJAKELL, TRUSTY MEDICAL RECORD LK:[id number] ACCOUNT [id number] DATE OF BIRTH:08/18/1995 FACILITY: [facility] LOCATION: [facility] PHYSICIAN:Rayane Gallardo Lincoln Maxin, MD  OPERATIVE REPORT  DATE OF PROCEDURE:  03/10/2020  PREOPERATIVE DIAGNOSIS:  Right neck lymphadenopathy.  POSTOPERATIVE DIAGNOSIS:  Right neck lymphadenopathy.  OPERATION PERFORMED:  Excisional biopsy of right neck submandibular lymph node.  SURGEON:  Melony Overly, MD  ANESTHESIA:  General.  COMPLICATIONS:  None.  BRIEF CLINICAL NOTE:  Azul is a 24 year old gentleman who has had right neck lymphadenopathy for about 6 months now.  Findings are consistent with a lymphoproliferative disorder and excisional biopsy of a neck node was recommended by [name].  He  was taken to the operating room at this time for excisional biopsy of right neck lymph node.  DESCRIPTION OF PROCEDURE:  After adequate endotracheal anesthesia, the right neck was prepped with Betadine solution and draped in sterile towels.  The lymph node in the submandibular area was marked out, was just below the region of the mandible, just  posterior to the submandibular gland.  Horizontal incision was made directly over the lymph node.  Dissection was carried down through subcutaneous tissues.  Platysma muscle was divided and retracted laterally and the lymph node was easily found in the  region of the marginal nerve.  At this point, retraction was used mostly and some slight sharp dissection to expose the lymph node and then the lymph node was dissected out of his region, bluntly and using bipolar cautery for hemostasis of the small  feeding vessels.  The lymph node was located just posterior to the facial vein and artery.  The lymph node was removed measuring approximately 2 cm in size and sent to pathology in saline for lymphoma workup.  Hemostasis was obtained with bipolar  cautery.  Wound was irrigated with saline and closed with 3-0  chromic sutures to reapproximate platysma muscle and then subcuticular 6-0 Vicryl suture followed by Dermabond.  The patient was subsequently awoken from anesthesia and transferred to recovery  room and postoperatively doing well.  DISPOSITION:  The patient is discharged home later this morning and will follow up in my office in 1 week for recheck.  CN/NUANCE  D:03/10/2020 T:03/10/2020 JOB:[id number]

## 2020-03-10 NOTE — Anesthesia Preprocedure Evaluation (Signed)
Anesthesia Evaluation  Patient identified by MRN, date of birth, ID band Patient awake    Reviewed: Allergy & Precautions, NPO status , Patient's Chart, lab work & pertinent test results  Airway Mallampati: II  TM Distance: >3 FB Neck ROM: Full    Dental no notable dental hx.    Pulmonary neg pulmonary ROS,    Pulmonary exam normal breath sounds clear to auscultation       Cardiovascular negative cardio ROS Normal cardiovascular exam Rhythm:Regular Rate:Normal     Neuro/Psych negative neurological ROS  negative psych ROS   GI/Hepatic Neg liver ROS, GERD  ,  Endo/Other  negative endocrine ROS  Renal/GU negative Renal ROS  negative genitourinary   Musculoskeletal negative musculoskeletal ROS (+)   Abdominal   Peds negative pediatric ROS (+)  Hematology negative hematology ROS (+) anemia ,   Anesthesia Other Findings   Reproductive/Obstetrics negative OB ROS                             Anesthesia Physical Anesthesia Plan  ASA: II  Anesthesia Plan: General   Post-op Pain Management:    Induction: Intravenous  PONV Risk Score and Plan: 2 and Ondansetron, Midazolam and Treatment may vary due to age or medical condition  Airway Management Planned: Oral ETT  Additional Equipment:   Intra-op Plan:   Post-operative Plan: Extubation in OR  Informed Consent: I have reviewed the patients History and Physical, chart, labs and discussed the procedure including the risks, benefits and alternatives for the proposed anesthesia with the patient or authorized representative who has indicated his/her understanding and acceptance.     Dental advisory given  Plan Discussed with: CRNA  Anesthesia Plan Comments:         Anesthesia Quick Evaluation

## 2020-03-10 NOTE — Interval H&P Note (Signed)
History and Physical Interval Note:  03/10/2020 7:33 AM  Mark Decker  has presented today for surgery, with the diagnosis of CERVICAL LYMPHADENOPATHY.  The various methods of treatment have been discussed with the patient and family. After consideration of risks, benefits and other options for treatment, the patient has consented to  Procedure(s): EXCISIONAL RIGHT NECK MASS BIOPSY (Right) as a surgical intervention.  The patient's history has been reviewed, patient examined, no change in status, stable for surgery.  I have reviewed the patient's chart and labs.  Questions were answered to the patient's satisfaction.     Melony Overly

## 2020-03-10 NOTE — Discharge Instructions (Signed)
  Post Anesthesia Home Care Instructions  Activity: Get plenty of rest for the remainder of the day. A responsible individual must stay with you for 24 hours following the procedure.  For the next 24 hours, DO NOT: -Drive a car -Paediatric nurse -Drink alcoholic beverages -Take any medication unless instructed by your physician -Make any legal decisions or sign important papers.  Meals: Start with liquid foods such as gelatin or soup. Progress to regular foods as tolerated. Avoid greasy, spicy, heavy foods. If nausea and/or vomiting occur, drink only clear liquids until the nausea and/or vomiting subsides. Call your physician if vomiting continues.  Special Instructions/Symptoms: Your throat may feel dry or sore from the anesthesia or the breathing tube placed in your throat during surgery. If this causes discomfort, gargle with warm salt water. The discomfort should disappear within 24 hours.  Keep incision site dry for 24 hrs and then you can get it wet. Tylenol or ibuprofen prn pain Call office for follow up appt. In 7-10 days     [phone]

## 2020-03-14 ENCOUNTER — Encounter: Payer: Self-pay | Admitting: Hematology and Oncology

## 2020-03-14 ENCOUNTER — Other Ambulatory Visit: Payer: Self-pay

## 2020-03-14 ENCOUNTER — Inpatient Hospital Stay (HOSPITAL_BASED_OUTPATIENT_CLINIC_OR_DEPARTMENT_OTHER): Payer: 59 | Admitting: Hematology and Oncology

## 2020-03-14 ENCOUNTER — Telehealth: Payer: Self-pay | Admitting: *Deleted

## 2020-03-14 ENCOUNTER — Telehealth: Payer: Self-pay | Admitting: Hematology and Oncology

## 2020-03-14 VITALS — BP 139/70 | HR 94 | Temp 98.5°F | Resp 18 | Ht 77.0 in | Wt 207.2 lb

## 2020-03-14 DIAGNOSIS — D509 Iron deficiency anemia, unspecified: Secondary | ICD-10-CM

## 2020-03-14 DIAGNOSIS — C8190 Hodgkin lymphoma, unspecified, unspecified site: Secondary | ICD-10-CM

## 2020-03-14 DIAGNOSIS — K50919 Crohn's disease, unspecified, with unspecified complications: Secondary | ICD-10-CM | POA: Diagnosis not present

## 2020-03-14 DIAGNOSIS — Z5111 Encounter for antineoplastic chemotherapy: Secondary | ICD-10-CM | POA: Diagnosis not present

## 2020-03-14 DIAGNOSIS — C8111 Nodular sclerosis classical Hodgkin lymphoma, lymph nodes of head, face, and neck: Secondary | ICD-10-CM | POA: Diagnosis not present

## 2020-03-14 DIAGNOSIS — Z299 Encounter for prophylactic measures, unspecified: Secondary | ICD-10-CM

## 2020-03-14 HISTORY — DX: Hodgkin lymphoma, unspecified, unspecified site: C81.90

## 2020-03-14 MED ORDER — ONDANSETRON HCL 8 MG PO TABS: 8.0000 mg | ORAL_TABLET | Freq: Three times a day (TID) | ORAL | 1 refills | Status: DC | PRN

## 2020-03-14 MED ORDER — LIDOCAINE-PRILOCAINE 2.5-2.5 % EX CREA: TOPICAL_CREAM | CUTANEOUS | 3 refills | Status: DC

## 2020-03-14 MED ORDER — PROCHLORPERAZINE MALEATE 10 MG PO TABS: 10.0000 mg | ORAL_TABLET | Freq: Four times a day (QID) | ORAL | 1 refills | Status: DC | PRN

## 2020-03-14 MED ORDER — INFLUENZA VAC SPLIT QUAD 0.5 ML IM SUSY
0.5000 mL | PREFILLED_SYRINGE | Freq: Once | INTRAMUSCULAR | Status: AC
  Administered 2020-03-14: 0.5 mL via INTRAMUSCULAR

## 2020-03-14 MED FILL — ONDANSETRON HCL 8 MG TABLET: 8 | 21 days supply | Qty: 18 | Fill #0

## 2020-03-14 MED FILL — PROCHLORPERAZINE 10 MG TAB: 10 | 7 days supply | Qty: 30 | Fill #0

## 2020-03-14 MED FILL — LIDOCAINE-PRILOCAINE CREAM: 2.5-2.5 | 30 days supply | Qty: 30 | Fill #0

## 2020-03-14 NOTE — Assessment & Plan Note (Addendum)
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 based on recent CT imaging I do not feel strongly he needs PET CT scan or bone marrow biopsy We discussed the rationale behind baseline pulmonary function test, echocardiogram, port placement, chemo education class and sperm banking before we start treatment I reviewed the NCCN guidelines with the patient We discussed the risk, benefits, side effects of Adriamycin, bleomycin, vinblastine and dacarbazine Some of the expected side effects including pancytopenia, risk of infection, peripheral neuropathy, cardiomyopathy, pulmonary toxicity, infertility, hair loss, neuropathy and changes in bowel habits were discussed and he is in agreement to proceed  I will try to arrange for this to start as soon as possible I plan 2 cycles of treatment before repeating PET CT scan to assess response to therapy Due to risk of immunocompromise state and infection, I do not recommend him to return back to work until he completes his treatment in 6 months

## 2020-03-14 NOTE — Progress Notes (Signed)
START ON PATHWAY REGIMEN - Lymphoma and CLL     A cycle is every 28 days:     Bleomycin      Dacarbazine      Doxorubicin      Vinblastine   **Always confirm dose/schedule in your pharmacy ordering system**  Patient Characteristics: Classical Hodgkin Lymphoma, First Line, Stage I / II, Early Unfavorable with  Risk Factors Other  Than Bulky Mediastinal Disease, Age < 25 Disease Type: Not Applicable Disease Type: Not Applicable Disease Type: Classical Hodgkin Lymphoma Line of therapy: First Line First Line, Stage I/II Disease Characteristics: Early Unfavorable with Risk Factors Other Than Bulky Mediastinal Disease Age: < 2 Intent of Therapy: Curative Intent, Discussed with Patient

## 2020-03-14 NOTE — Telephone Encounter (Signed)
Scheduled appts per 9/17 sch msg. Gave pt a print out of AVS.

## 2020-03-14 NOTE — Telephone Encounter (Signed)
Per Dr.Gorsuch, called pt to give appt time and date for ECHO. Pt verbalized understanding. Also called and emailed Kaitlyn Scotton to set up appt for PFT next week as soon as possible. As well as called Tiffany at [facility] IR to get pt scheduled for port placement next week if possible. VM was left on IR and PFT voicemail. Awaiting a callback to the office.

## 2020-03-14 NOTE — Assessment & Plan Note (Signed)
I am attempting to call his GI physician He will need to start Humira due to association with lymphoma and while on treatment

## 2020-03-14 NOTE — Assessment & Plan Note (Signed)
We discussed the importance of preventive care and reviewed the vaccination programs. He does not have any prior allergic reactions to influenza vaccination. He agrees to proceed with influenza vaccination today and we will administer it today at the clinic.

## 2020-03-14 NOTE — Assessment & Plan Note (Signed)
I will start him on IV iron in the future

## 2020-03-14 NOTE — Progress Notes (Signed)
[place] OFFICE PROGRESS NOTE  Patient Care Team: [name], MD as PCP - General (Family Medicine) [name], MD as Attending Physician (Family Medicine) [name], MD (Inactive) (Gastroenterology)  ASSESSMENT & PLAN:  Hodgkin lymphoma [facility]) 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 based on recent CT imaging I do not feel strongly he needs PET CT scan or bone marrow biopsy We discussed the rationale behind baseline pulmonary function test, echocardiogram, port placement, chemo education class and sperm banking before we start treatment I reviewed the NCCN guidelines with the patient We discussed the risk, benefits, side effects of Adriamycin, bleomycin, vinblastine and dacarbazine Some of the expected side effects including pancytopenia, risk of infection, peripheral neuropathy, cardiomyopathy, pulmonary toxicity, infertility, hair loss, neuropathy and changes in bowel habits were discussed and he is in agreement to proceed  I will try to arrange for this to start as soon as possible I plan 2 cycles of treatment before repeating PET CT scan to assess response to therapy Due to risk of immunocompromise state and infection, I do not recommend him to return back to work until he completes his treatment in 6 months    Crohn disease ([place]) I am attempting to call his GI physician He will need to start Humira due to association with lymphoma and while on treatment  Iron deficiency anemia, unspecified I will start him on IV iron in the future  Preventive measure We discussed the importance of preventive care and reviewed the vaccination programs. He does not have any prior allergic reactions to influenza vaccination. He agrees to proceed with influenza vaccination today and we will administer it today at the clinic.    Orders Placed This Encounter  Procedures  . IR IMAGING GUIDED PORT INSERTION    Standing Status:    Future    Standing Expiration Date:   03/14/2021    Order Specific Question:   Reason for Exam (SYMPTOM  OR DIAGNOSIS REQUIRED)    Answer:   need port for chemo to start 9/27    Order Specific Question:   Preferred Imaging Location?    Answer:   [facility]  . CBC with Differential ([place] Only)    Standing Status:   Standing    Number of Occurrences:   20    Standing Expiration Date:   03/14/2021  . CMP ([place] only)    Standing Status:   Standing    Number of Occurrences:   20    Standing Expiration Date:   03/14/2021  . ECHOCARDIOGRAM COMPLETE    Standing Status:   Future    Standing Expiration Date:   03/14/2021    Order Specific Question:   Where should this test be performed    Answer:   [facility]    Order Specific Question:   Perflutren DEFINITY (image enhancing agent) should be administered unless hypersensitivity or allergy exist    Answer:   Administer Perflutren    Order Specific Question:   Reason for exam-Echo    Answer:   Chemotherapy evaluation  v87.41 / v58.11  . Pulmonary Function Test    Standing Status:   Future    Standing Expiration Date:   03/14/2021    Order Specific Question:   Where should this test be performed?    Answer:   [facility]    Order Specific Question:   Full PFT: includes the following: basic spirometry, spirometry pre & post bronchodilator,  diffusion capacity (DLCO), lung volumes    Answer:   FULL PFT Without spirometry post bronchodilator    Order Specific Question:   MIP/MEP    Answer:   No    Order Specific Question:   6 minute walk    Answer:   No    Order Specific Question:   ABG    Answer:   No    Order Specific Question:   Diffusion capacity (DLCO)    Answer:   Yes    Order Specific Question:   Lung volumes    Answer:   Yes    Order Specific Question:   Methacholine challenge    Answer:   No    All questions were answered. The patient knows to call the clinic with any problems, questions or concerns. The  total time spent in the appointment was 55 minutes encounter with patients including review of chart and various tests results, discussions about plan of care and coordination of care plan   [name], MD 03/16/2020 10:06 AM  INTERVAL HISTORY: Please see below for problem oriented charting. He returns with his girlfriend to review test results He tolerated recent surgery well  SUMMARY OF ONCOLOGIC HISTORY: Oncology History  Hodgkin lymphoma ([place])  03/10/2020 Pathology Results   A. LYMPH NODE, RIGHT SUBMANDIBULAR, BIOPSY:  -  Classic Hodgkin lymphoma  -  See comment   COMMENT:   The submitted lymph nodes are effaced by a vaguely nodular lymphoid proliferation separated by bands of fibrosis with admixed inflammatory cells including neutrophils and eosinophils. The lymphoid proliferation  is composed predominantly of small mature lymphocytes with scattered large atypical cells. The atypical cells have mono and multi-lobated nuclei and prominent nucleoli characteristic of Reed-Sternberg cells.  Lacunar cells are also present.   The neoplastic cells are CD30, CD15 (subset), Pax-5 (dim) and CD20 positive by immunohistochemistry. EBV by in-situ hybridization is positive in the Reed-Sternberg cells. CD3 highlights the background  T-cells.  EBV in-situ hybridization is negative.   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    03/14/2020 Initial Diagnosis   Hodgkin lymphoma ([place])   03/14/2020 Cancer Staging   Staging form: Hodgkin and Non-Hodgkin Lymphoma, AJCC 8th Edition - Clinical stage from 03/14/2020: Stage I (Hodgkin lymphoma, B - Symptoms) - Signed by [name], MD on 03/14/2020     REVIEW OF SYSTEMS:   Eyes: Denies blurriness of vision Ears, nose, mouth, throat, and face: Denies mucositis or sore throat Respiratory: Denies cough, dyspnea or wheezes Cardiovascular: Denies  palpitation, chest discomfort or lower extremity swelling Gastrointestinal:  Denies nausea, heartburn or change in bowel habits Skin: Denies abnormal skin rashes Neurological:Denies numbness, tingling or new weaknesses Behavioral/Psych: Mood is stable, no new changes  All other systems were reviewed with the patient and are negative.  I have reviewed the past medical history, past surgical history, social history and family history with the patient and they are unchanged from previous note.  ALLERGIES:  is allergic to amoxicillin.  MEDICATIONS:  Current Outpatient Medications  Medication Sig Dispense Refill  . MESALAMINE PO Take 1,600 mg by mouth 2 (two) times daily.    . Cyanocobalamin (VITAMIN B-12) 5000 MCG TBDP Take 5,000 mcg by mouth daily.    Marland Kitchen lidocaine-prilocaine (EMLA) cream Apply to affected area once 30 g 3  . magnesium chloride (SLOW-MAG) 64 MG TBEC SR tablet Take by mouth.    . Multiple Vitamin (MULTIVITAMIN) tablet  Take 1 tablet by mouth daily.    . Multiple Vitamins-Iron (MULTIVITAMINS WITH IRON) TABS tablet Take 1 tablet by mouth daily.    . Multiple Vitamins-Minerals (ZINC PO) Take by mouth.    . ondansetron (ZOFRAN) 8 MG tablet Take 1 tablet (8 mg total) by mouth every 8 (eight) hours as needed. Start on the third day after chemotherapy. 30 tablet 1  . prochlorperazine (COMPAZINE) 10 MG tablet Take 1 tablet (10 mg total) by mouth every 6 (six) hours as needed (Nausea or vomiting). 30 tablet 1  . Vitamin D, Cholecalciferol, 1000 UNITS CAPS Take 1,000 Units by mouth daily.     No current facility-administered medications for this visit.    PHYSICAL EXAMINATION: ECOG PERFORMANCE STATUS: 1 - Symptomatic but completely ambulatory  Vitals:   03/14/20 1408  BP: 139/70  Pulse: 94  Resp: 18  Temp: 98.5 F (36.9 C)  SpO2: 100%   Filed Weights   03/14/20 1408  Weight: 207 lb 3.2 oz (94 kg)    GENERAL:alert, no distress and comfortable NEURO: alert & oriented x 3  with fluent speech, no focal motor/sensory deficits  LABORATORY DATA:  I have reviewed the data as listed    Component Value Date/Time   NA 132 (L) 03/07/2020 1342   NA 141 04/27/2013 1217   K 3.9 03/07/2020 1342   K 4.0 04/27/2013 1217   CL 98 03/07/2020 1342   CL 106 04/26/2012 1550   CO2 25 03/07/2020 1342   CO2 28 04/27/2013 1217   GLUCOSE 95 03/07/2020 1342   GLUCOSE 92 04/27/2013 1217   GLUCOSE 108 (H) 04/26/2012 1550   BUN 14 03/07/2020 1342   BUN 10.2 04/27/2013 1217   CREATININE 0.97 03/07/2020 1342   CREATININE 0.8 04/27/2013 1217   CALCIUM 9.2 03/07/2020 1342   CALCIUM 9.7 04/27/2013 1217   PROT 8.1 03/07/2020 1342   PROT 7.5 04/27/2013 1217   ALBUMIN 3.3 (L) 03/07/2020 1342   ALBUMIN 3.6 04/27/2013 1217   AST 30 03/07/2020 1342   AST 14 04/27/2013 1217   ALT 45 (H) 03/07/2020 1342   ALT 23 04/27/2013 1217   ALKPHOS 80 03/07/2020 1342   ALKPHOS 64 04/27/2013 1217   BILITOT 0.7 03/07/2020 1342   BILITOT 0.86 04/27/2013 1217   GFRNONAA >60 03/07/2020 1342   GFRAA >60 03/07/2020 1342    No results found for: SPEP, UPEP  Lab Results  Component Value Date   WBC 8.0 03/07/2020   NEUTROABS 3.7 03/07/2020   HGB 11.7 (L) 03/07/2020   HCT 36.7 (L) 03/07/2020   MCV 80.5 03/07/2020   PLT 304 03/07/2020      Chemistry      Component Value Date/Time   NA 132 (L) 03/07/2020 1342   NA 141 04/27/2013 1217   K 3.9 03/07/2020 1342   K 4.0 04/27/2013 1217   CL 98 03/07/2020 1342   CL 106 04/26/2012 1550   CO2 25 03/07/2020 1342   CO2 28 04/27/2013 1217   BUN 14 03/07/2020 1342   BUN 10.2 04/27/2013 1217   CREATININE 0.97 03/07/2020 1342   CREATININE 0.8 04/27/2013 1217      Component Value Date/Time   CALCIUM 9.2 03/07/2020 1342   CALCIUM 9.7 04/27/2013 1217   ALKPHOS 80 03/07/2020 1342   ALKPHOS 64 04/27/2013 1217   AST 30 03/07/2020 1342   AST 14 04/27/2013 1217   ALT 45 (H) 03/07/2020 1342   ALT 23 04/27/2013 1217   BILITOT 0.7 03/07/2020 1342  BILITOT 0.86 04/27/2013 1217

## 2020-03-14 NOTE — Patient Instructions (Signed)

## 2020-03-17 ENCOUNTER — Telehealth: Payer: Self-pay

## 2020-03-17 ENCOUNTER — Other Ambulatory Visit: Payer: Self-pay

## 2020-03-17 ENCOUNTER — Inpatient Hospital Stay: Payer: 59

## 2020-03-17 ENCOUNTER — Encounter (HOSPITAL_BASED_OUTPATIENT_CLINIC_OR_DEPARTMENT_OTHER): Payer: Self-pay | Admitting: Otolaryngology

## 2020-03-17 NOTE — Telephone Encounter (Signed)
Printed off schedule and given to [facility] in the lobby for port placement on 9/23, npo after midnight for 0830 appt at main entrance, instructed to have driver. He verbalized understanding.

## 2020-03-18 ENCOUNTER — Ambulatory Visit (HOSPITAL_COMMUNITY)
Admission: RE | Admit: 2020-03-18 | Discharge: 2020-03-18 | Disposition: A | Discharge status: Home / Self Care | Payer: 59 | Source: Ambulatory Visit | Attending: Hematology and Oncology | Admitting: Hematology and Oncology

## 2020-03-18 ENCOUNTER — Other Ambulatory Visit: Payer: Self-pay | Admitting: Radiology

## 2020-03-18 ENCOUNTER — Other Ambulatory Visit (HOSPITAL_COMMUNITY)
Admission: RE | Admit: 2020-03-18 | Discharge: 2020-03-18 | Disposition: A | Discharge status: Home / Self Care | Payer: 59 | Source: Ambulatory Visit | Attending: Hematology and Oncology | Admitting: Hematology and Oncology

## 2020-03-18 ENCOUNTER — Encounter (INDEPENDENT_AMBULATORY_CARE_PROVIDER_SITE_OTHER): Payer: Self-pay | Admitting: Otolaryngology

## 2020-03-18 ENCOUNTER — Ambulatory Visit (INDEPENDENT_AMBULATORY_CARE_PROVIDER_SITE_OTHER): Payer: 59 | Admitting: Otolaryngology

## 2020-03-18 VITALS — Temp 97.7°F

## 2020-03-18 DIAGNOSIS — C8111 Nodular sclerosis classical Hodgkin lymphoma, lymph nodes of head, face, and neck: Secondary | ICD-10-CM | POA: Insufficient documentation

## 2020-03-18 DIAGNOSIS — Z01818 Encounter for other preprocedural examination: Secondary | ICD-10-CM | POA: Insufficient documentation

## 2020-03-18 DIAGNOSIS — Z4889 Encounter for other specified surgical aftercare: Secondary | ICD-10-CM

## 2020-03-18 DIAGNOSIS — Z20822 Contact with and (suspected) exposure to covid-19: Secondary | ICD-10-CM | POA: Insufficient documentation

## 2020-03-18 LAB — ECHOCARDIOGRAM COMPLETE
Area-P 1/2: 7.99 cm2
S' Lateral: 3.3 cm

## 2020-03-18 LAB — SARS CORONAVIRUS 2 (TAT 6-24 HRS): SARS Coronavirus 2: NEGATIVE

## 2020-03-18 LAB — SURGICAL PATHOLOGY

## 2020-03-18 NOTE — Progress Notes (Signed)
Pharmacist Chemotherapy Monitoring - Initial Assessment    Anticipated start date: 03/24/20   Regimen:  . Are orders appropriate based on the patient's diagnosis, regimen, and cycle? Yes . Does the plan date match the patient's scheduled date? Yes . Is the sequencing of drugs appropriate? Yes . Are the premedications appropriate for the patient's regimen? Yes . Prior Authorization for treatment is: Pending o If applicable, is the correct biosimilar selected based on the patient's insurance? not applicable  Organ Function and Labs: Marland Kitchen Are dose adjustments needed based on the patient's renal function, hepatic function, or hematologic function? No . Are appropriate labs ordered prior to the start of patient's treatment? Yes . Other organ system assessment, if indicated: anthracyclines: Echo/ MUGA and bleomycin: PFTs . The following baseline labs, if indicated, have been ordered: N/A  Dose Assessment: . Are the drug doses appropriate? Yes . Are the following correct: o Drug concentrations Yes o IV fluid compatible with drug Yes o Administration routes Yes o Timing of therapy Yes . If applicable, does the patient have documented access for treatment and/or plans for port-a-cath placement? not applicable . If applicable, have lifetime cumulative doses been properly documented and assessed? not applicable Lifetime Dose Tracking  No doses have been documented on this patient for the following tracked chemicals: Doxorubicin, Epirubicin, Idarubicin, Daunorubicin, Mitoxantrone, Bleomycin, Oxaliplatin, Carboplatin, Liposomal Doxorubicin  o   Toxicity Monitoring/Prevention: . The patient has the following take home antiemetics prescribed: Ondansetron and Prochlorperazine . The patient has the following take home medications prescribed: N/A . Medication allergies and previous infusion related reactions, if applicable, have been reviewed and addressed. Yes . The patient's current medication list  has been assessed for drug-drug interactions with their chemotherapy regimen. no significant drug-drug interactions were identified on review.  Order Review: . Are the treatment plan orders signed? No . Is the patient scheduled to see a provider prior to their treatment? No  I verify that I have reviewed each item in the above checklist and answered each question accordingly.  Delicia Berens K 03/18/2020 1:56 PM

## 2020-03-18 NOTE — Progress Notes (Signed)
  Echocardiogram 2D Echocardiogram has been performed.  Mark Decker 03/18/2020, 9:31 AM

## 2020-03-18 NOTE — Progress Notes (Signed)
HPI: Mark Decker is a 24 y.o. male who presents 8 days s/p right submandibular lymph node biopsy.  Final pathology revealed classic Hodgkin's lymphoma.  Patient is seen by Dr Alvy Bimler..   Past Medical History:  Diagnosis Date  . Anemia 04/26/2013  . Crohn disease ([place])   . Gastroesophageal reflux disease   . Iron deficiency anemia   . Left varicocele   . Lymphopenia 04/14/2012   HIV negative in 03/2012 per PCP.  Marland Kitchen Lymphopenia 04/27/2013  . Weight loss    Past Surgical History:  Procedure Laterality Date  . MASS BIOPSY Right 03/10/2020   Procedure: EXCISIONAL RIGHT NECK MASS BIOPSY;  Surgeon: Rozetta Nunnery, MD;  Location: [place];  Service: ENT;  Laterality: Right;  Marland Kitchen VARICOCELE EXCISION  2010   Social History   Socioeconomic History  . Marital status: Single    Spouse name: Not on file  . Number of children: 0  . Years of education: Not on file  . Highest education level: Not on file  Occupational History    Comment: [employer]; Psych  Tobacco Use  . Smoking status: Never Smoker  . Smokeless tobacco: Never Used  Vaping Use  . Vaping Use: Former  . Substances: Mixture of cannabinoids  Substance and Sexual Activity  . Alcohol use: No    Comment: occ.  . Drug use: Yes    Types: Marijuana  . Sexual activity: Not on file  Other Topics Concern  . Not on file  Social History Narrative  . Not on file   Social Determinants of Health   Financial Resource Strain:   . Difficulty of Paying Living Expenses: Not on file  Food Insecurity:   . Worried About Charity fundraiser in the Last Year: Not on file  . Ran Out of Food in the Last Year: Not on file  Transportation Needs:   . Lack of Transportation (Medical): Not on file  . Lack of Transportation (Non-Medical): Not on file  Physical Activity:   . Days of Exercise per Week: Not on file  . Minutes of Exercise per Session: Not on file  Stress:   . Feeling of Stress : Not on file   Social Connections:   . Frequency of Communication with Friends and Family: Not on file  . Frequency of Social Gatherings with Friends and Family: Not on file  . Attends Religious Services: Not on file  . Active Member of Clubs or Organizations: Not on file  . Attends Archivist Meetings: Not on file  . Marital Status: Not on file   Family History  Problem Relation Age of Onset  . Arthritis Mother   . Crohn's disease Mother   . Hypertension Mother   . IgA nephropathy Mother   . Arthritis Father   . Migraines Father   . Eczema Father   . Cancer Maternal Aunt 60       colon  . Cancer Paternal Grandfather        colon  . Migraines Sister    Allergies  Allergen Reactions  . Amoxicillin Anaphylaxis   Prior to Admission medications   Medication Sig Start Date End Date Taking? Authorizing Provider  acetaminophen (TYLENOL) 500 MG tablet Take 1,000 mg by mouth every 6 (six) hours as needed for moderate pain or headache.   Yes [provider]  Carboxymethylcellul-Glycerin (LUBRICATING EYE DROPS OP) Place 1 drop into both eyes daily as needed (dry eyes).   Yes [provider]  Cholecalciferol (VITAMIN D) 50 MCG (2000 UT) tablet Take 6,000 Units by mouth daily.   Yes [provider]  EPINEPHrine 0.3 mg/0.3 mL IJ SOAJ injection Inject 0.3 mg into the muscle as needed for anaphylaxis. 01/11/20  Yes [provider]  ferrous sulfate 325 (65 FE) MG tablet Take 325 mg by mouth daily.   Yes [provider]  lidocaine-prilocaine (EMLA) cream Apply to affected area once Patient taking differently: Apply 1 application topically daily as needed (port access).  03/14/20  Yes Gorsuch, Ni, MD  Magnesium 250 MG TABS Take 250 mg by mouth daily.   Yes [provider]  Multiple Vitamins-Minerals (ZINC PO) Take 2 tablets by mouth daily. With quercetin   Yes [provider]  ondansetron (ZOFRAN) 8 MG tablet Take 1 tablet (8 mg total) by  mouth every 8 (eight) hours as needed. Start on the third day after chemotherapy. Patient taking differently: Take 8 mg by mouth every 8 (eight) hours as needed for nausea or vomiting. Start on the third day after chemotherapy. 03/14/20  Yes Gorsuch, Ernst Spell, MD  OVER THE COUNTER MEDICATION Take 2 capsules by mouth at bedtime as needed (sleep). Stress Relief otc supplement   Yes [provider]  oxymetazoline (AFRIN) 0.05 % nasal spray Place 1 spray into both nostrils 2 (two) times daily as needed for congestion.   Yes [provider]  polyethylene glycol (MIRALAX / GLYCOLAX) 17 g packet Take 17 g by mouth daily.   Yes [provider]  prochlorperazine (COMPAZINE) 10 MG tablet Take 1 tablet (10 mg total) by mouth every 6 (six) hours as needed (Nausea or vomiting). 03/14/20  Yes Gorsuch, Ni, MD  SUPER B COMPLEX/C PO Take 1 tablet by mouth daily.   Yes [provider]  TURMERIC PO Take 2 capsules by mouth daily.   Yes [provider]     Physical Exam: Excisional biopsy site is healing nicely with no signs of infection.   Assessment: S/p right submandibular lymph node biopsy.  Report showed Hodgkin's lymphoma  Plan: Patient will follow up with [name] concerning further treatment.   Radene Journey, MD

## 2020-03-19 ENCOUNTER — Other Ambulatory Visit: Payer: Self-pay | Admitting: Radiology

## 2020-03-20 ENCOUNTER — Ambulatory Visit (HOSPITAL_COMMUNITY)
Admission: RE | Admit: 2020-03-20 | Discharge: 2020-03-20 | Disposition: A | Discharge status: Home / Self Care | Payer: 59 | Source: Ambulatory Visit | Attending: Hematology and Oncology | Admitting: Hematology and Oncology

## 2020-03-20 ENCOUNTER — Other Ambulatory Visit: Payer: Self-pay

## 2020-03-20 ENCOUNTER — Telehealth: Payer: Self-pay

## 2020-03-20 DIAGNOSIS — D509 Iron deficiency anemia, unspecified: Secondary | ICD-10-CM | POA: Diagnosis not present

## 2020-03-20 DIAGNOSIS — C8191 Hodgkin lymphoma, unspecified, lymph nodes of head, face, and neck: Secondary | ICD-10-CM | POA: Diagnosis present

## 2020-03-20 DIAGNOSIS — Z79899 Other long term (current) drug therapy: Secondary | ICD-10-CM | POA: Diagnosis not present

## 2020-03-20 DIAGNOSIS — C8111 Nodular sclerosis classical Hodgkin lymphoma, lymph nodes of head, face, and neck: Secondary | ICD-10-CM

## 2020-03-20 DIAGNOSIS — K5090 Crohn's disease, unspecified, without complications: Secondary | ICD-10-CM | POA: Insufficient documentation

## 2020-03-20 DIAGNOSIS — K219 Gastro-esophageal reflux disease without esophagitis: Secondary | ICD-10-CM | POA: Diagnosis not present

## 2020-03-20 HISTORY — PX: IR IMAGING GUIDED PORT INSERTION: IMG5740

## 2020-03-20 LAB — CBC WITH DIFFERENTIAL/PLATELET
Abs Immature Granulocytes: 0.02 10*3/uL (ref 0.00–0.07)
Basophils Absolute: 0 10*3/uL (ref 0.0–0.1)
Basophils Relative: 0 %
Eosinophils Absolute: 0.1 10*3/uL (ref 0.0–0.5)
Eosinophils Relative: 1 %
HCT: 39.3 % (ref 39.0–52.0)
Hemoglobin: 12.1 g/dL — ABNORMAL LOW (ref 13.0–17.0)
Immature Granulocytes: 0 %
Lymphocytes Relative: 25 %
Lymphs Abs: 1.3 10*3/uL (ref 0.7–4.0)
MCH: 25.2 pg — ABNORMAL LOW (ref 26.0–34.0)
MCHC: 30.8 g/dL (ref 30.0–36.0)
MCV: 81.9 fL (ref 80.0–100.0)
Monocytes Absolute: 1.5 10*3/uL — ABNORMAL HIGH (ref 0.1–1.0)
Monocytes Relative: 27 %
Neutro Abs: 2.5 10*3/uL (ref 1.7–7.7)
Neutrophils Relative %: 47 %
Platelets: 371 10*3/uL (ref 150–400)
RBC: 4.8 MIL/uL (ref 4.22–5.81)
RDW: 16.6 % — ABNORMAL HIGH (ref 11.5–15.5)
WBC: 5.4 10*3/uL (ref 4.0–10.5)
nRBC: 0 % (ref 0.0–0.2)

## 2020-03-20 LAB — PROTIME-INR
INR: 1.1 (ref 0.8–1.2)
Prothrombin Time: 14 seconds (ref 11.4–15.2)

## 2020-03-20 MED ORDER — LIDOCAINE-EPINEPHRINE 1 %-1:100000 IJ SOLN
INTRAMUSCULAR | Status: AC | PRN
  Administered 2020-03-20: 10 mL

## 2020-03-20 MED ORDER — LIDOCAINE HCL 1 % IJ SOLN
INTRAMUSCULAR | Status: AC | PRN
  Administered 2020-03-20: 10 mL

## 2020-03-20 MED ORDER — LIDOCAINE-EPINEPHRINE 1 %-1:100000 IJ SOLN
INTRAMUSCULAR | Status: AC
  Filled 2020-03-20: qty 1

## 2020-03-20 MED ORDER — MIDAZOLAM HCL 2 MG/2ML IJ SOLN
INTRAMUSCULAR | Status: AC
  Filled 2020-03-20: qty 2

## 2020-03-20 MED ORDER — MIDAZOLAM HCL 2 MG/2ML IJ SOLN
INTRAMUSCULAR | Status: AC | PRN
  Administered 2020-03-20 (×2): 0.5 mg via INTRAVENOUS
  Administered 2020-03-20: 1 mg via INTRAVENOUS

## 2020-03-20 MED ORDER — FENTANYL CITRATE (PF) 100 MCG/2ML IJ SOLN
INTRAMUSCULAR | Status: AC | PRN
  Administered 2020-03-20: 25 ug via INTRAVENOUS
  Administered 2020-03-20: 50 ug via INTRAVENOUS
  Administered 2020-03-20: 25 ug via INTRAVENOUS

## 2020-03-20 MED ORDER — SODIUM CHLORIDE 0.9 % IV SOLN: INTRAVENOUS | Status: DC

## 2020-03-20 MED ORDER — FENTANYL CITRATE (PF) 100 MCG/2ML IJ SOLN
INTRAMUSCULAR | Status: AC
  Filled 2020-03-20: qty 2

## 2020-03-20 MED ORDER — CLINDAMYCIN PHOSPHATE 900 MG/50ML IV SOLN
900.0000 mg | Freq: Once | INTRAVENOUS | Status: AC
  Administered 2020-03-20: 900 mg via INTRAVENOUS
  Filled 2020-03-20 (×2): qty 50

## 2020-03-20 MED ORDER — HEPARIN SOD (PORK) LOCK FLUSH 100 UNIT/ML IV SOLN
INTRAVENOUS | Status: AC
  Filled 2020-03-20: qty 5

## 2020-03-20 MED ORDER — CLINDAMYCIN PHOSPHATE 900 MG/50ML IV SOLN
900.0000 mg | INTRAVENOUS | Status: DC
  Filled 2020-03-20: qty 50

## 2020-03-20 MED ORDER — LIDOCAINE HCL 1 % IJ SOLN
INTRAMUSCULAR | Status: AC
  Filled 2020-03-20: qty 20

## 2020-03-20 NOTE — Sedation Documentation (Signed)
ETC02 40

## 2020-03-20 NOTE — Sedation Documentation (Signed)
ETC02 42

## 2020-03-20 NOTE — Sedation Documentation (Signed)
ETC02 43

## 2020-03-20 NOTE — Telephone Encounter (Signed)
He called back. Sperm banking went well and he is ready to start treatment on Monday. Lab and flush appt scheduled on 9/27 at 8 am, he is aware of time. Instructed to call the office if needed.

## 2020-03-20 NOTE — Telephone Encounter (Signed)
Called and left a message asking him to call the office back. Asking if sperm banking is successful or not and whether he wants to keep chemo on Monday. Need to add lab appt prior to chemo appt.

## 2020-03-20 NOTE — Procedures (Signed)
Interventional Radiology Procedure:   Indications: Hodgkin Lymphoma  Procedure: Port placement  Findings: Left jugular port, tip at SVC/RA junction  Complications: None     EBL: Minimal, less than 10 ml  Plan: Discharge in one hour.  Keep port site and incisions dry for at least 24 hours.     Yerick Eggebrecht R. Anselm Pancoast, MD  Pager: [phone]

## 2020-03-20 NOTE — Consult Note (Signed)
Chief Complaint: Patient was seen in consultation today for port a cath placement  Referring Physician(s): Gorsuch,Ni  Supervising Physician: Markus Daft  Patient Status: [facility] - Out-pt  History of Present Illness: Mark Decker is a 24 y.o. male with hx newly diagnosed Hodgkin's lymphoma who presents today for port a cath placement for chemotherapy. Additional PMH as below.   Past Medical History:  Diagnosis Date  . Anemia 04/26/2013  . Crohn disease ([place])   . Gastroesophageal reflux disease   . Iron deficiency anemia   . Left varicocele   . Lymphopenia 04/14/2012   HIV negative in 03/2012 per PCP.  Marland Kitchen Lymphopenia 04/27/2013  . Weight loss     Past Surgical History:  Procedure Laterality Date  . MASS BIOPSY Right 03/10/2020   Procedure: EXCISIONAL RIGHT NECK MASS BIOPSY;  Surgeon: Rozetta Nunnery, MD;  Location: [place];  Service: ENT;  Laterality: Right;  Marland Kitchen VARICOCELE EXCISION  2010    Allergies: Amoxicillin  Medications: Prior to Admission medications   Medication Sig Start Date End Date Taking? Authorizing Provider  acetaminophen (TYLENOL) 500 MG tablet Take 1,000 mg by mouth every 6 (six) hours as needed for moderate pain or headache.   Yes [provider]  Cholecalciferol (VITAMIN D) 50 MCG (2000 UT) tablet Take 6,000 Units by mouth daily.   Yes [provider]  ferrous sulfate 325 (65 FE) MG tablet Take 325 mg by mouth daily.   Yes [provider]  Magnesium 250 MG TABS Take 250 mg by mouth daily.   Yes [provider]  Multiple Vitamins-Minerals (ZINC PO) Take 2 tablets by mouth daily. With quercetin   Yes [provider]  ondansetron (ZOFRAN) 8 MG tablet Take 1 tablet (8 mg total) by mouth every 8 (eight) hours as needed. Start on the third day after chemotherapy. Patient taking differently: Take 8 mg by mouth every 8 (eight) hours as needed for nausea or vomiting. Start on the  third day after chemotherapy. 03/14/20  Yes Gorsuch, Ernst Spell, MD  OVER THE COUNTER MEDICATION Take 2 capsules by mouth at bedtime as needed (sleep). Stress Relief otc supplement   Yes [provider]  oxymetazoline (AFRIN) 0.05 % nasal spray Place 1 spray into both nostrils 2 (two) times daily as needed for congestion.   Yes [provider]  polyethylene glycol (MIRALAX / GLYCOLAX) 17 g packet Take 17 g by mouth daily.   Yes [provider]  prochlorperazine (COMPAZINE) 10 MG tablet Take 1 tablet (10 mg total) by mouth every 6 (six) hours as needed (Nausea or vomiting). 03/14/20  Yes Gorsuch, Ni, MD  SUPER B COMPLEX/C PO Take 1 tablet by mouth daily.   Yes [provider]  TURMERIC PO Take 2 capsules by mouth daily.   Yes [provider]  Carboxymethylcellul-Glycerin (LUBRICATING EYE DROPS OP) Place 1 drop into both eyes daily as needed (dry eyes).    [provider]  EPINEPHrine 0.3 mg/0.3 mL IJ SOAJ injection Inject 0.3 mg into the muscle as needed for anaphylaxis. 01/11/20   [provider]  lidocaine-prilocaine (EMLA) cream Apply to affected area once Patient taking differently: Apply 1 application topically daily as needed (port access).  03/14/20   [name], MD     Family History  Problem Relation Age of Onset  . Arthritis Mother   . Crohn's disease Mother   . Hypertension Mother   . IgA nephropathy Mother   . Arthritis Father   .  Migraines Father   . Eczema Father   . Cancer Maternal Aunt 60       colon  . Cancer Paternal Grandfather        colon  . Migraines Sister     Social History   Socioeconomic History  . Marital status: Single    Spouse name: Not on file  . Number of children: 0  . Years of education: Not on file  . Highest education level: Not on file  Occupational History    Comment: [employer]; Psych  Tobacco Use  . Smoking status: Never Smoker  . Smokeless tobacco: Never Used  Vaping Use  .  Vaping Use: Former  . Substances: Mixture of cannabinoids  Substance and Sexual Activity  . Alcohol use: No    Comment: occ.  . Drug use: Yes    Types: Marijuana  . Sexual activity: Not on file  Other Topics Concern  . Not on file  Social History Narrative  . Not on file   Social Determinants of Health   Financial Resource Strain:   . Difficulty of Paying Living Expenses: Not on file  Food Insecurity:   . Worried About Charity fundraiser in the Last Year: Not on file  . Ran Out of Food in the Last Year: Not on file  Transportation Needs:   . Lack of Transportation (Medical): Not on file  . Lack of Transportation (Non-Medical): Not on file  Physical Activity:   . Days of Exercise per Week: Not on file  . Minutes of Exercise per Session: Not on file  Stress:   . Feeling of Stress : Not on file  Social Connections:   . Frequency of Communication with Friends and Family: Not on file  . Frequency of Social Gatherings with Friends and Family: Not on file  . Attends Religious Services: Not on file  . Active Member of Clubs or Organizations: Not on file  . Attends Archivist Meetings: Not on file  . Marital Status: Not on file      Review of Systems denies fever,HA,CP,dyspnea, cough, back pain,N/V or bleeding; he does have some occ abd pain  Vital Signs: BP 116/71   Pulse 85   Temp 97.9 F (36.6 C) (Oral)   Resp 18   Ht 6' 5"  (1.956 m)   Wt 210 lb (95.3 kg)   SpO2 100%   BMI 24.90 kg/m   Physical Exam awake/alert; chest- CTA bilat; heart- RRR; abd- soft,+BS,NT;no LE edema  Imaging: ECHOCARDIOGRAM COMPLETE  Result Date: 03/18/2020    ECHOCARDIOGRAM REPORT   Patient Name:   Mark Decker Date of Exam: 03/18/2020 Medical Rec #:  [id number]                 Height:       77.0 in Accession #:    [id number]                Weight:       207.2 lb Date of Birth:  [birth year]                 BSA:          2.271 m Patient Age:    24 years                  BP:            139/70 mmHg Patient Gender: M  HR:           94 bpm. Exam Location:  Outpatient Procedure: 2D Echo, Cardiac Doppler and Color Doppler Indications:    Chemo evaluation  History:        Patient has no prior history of Echocardiogram examinations.                 Hodgkins Lymphoma.  Sonographer:    Dustin Flock Referring Phys: [phone] NI [place]  1. Left ventricular ejection fraction, by estimation, is 55 to 60%. The left ventricle has normal function. The left ventricle has no regional wall motion abnormalities. Left ventricular diastolic parameters were normal. The average left ventricular global longitudinal strain is -15.9 %. The global longitudinal strain is normal.  2. Right ventricular systolic function is normal. The right ventricular size is normal. There is normal pulmonary artery systolic pressure.  3. The mitral valve is normal in structure. No evidence of mitral valve regurgitation. No evidence of mitral stenosis.  4. The aortic valve is normal in structure. Aortic valve regurgitation is not visualized. No aortic stenosis is present.  5. The inferior vena cava is normal in size with greater than 50% respiratory variability, suggesting right atrial pressure of 3 mmHg. FINDINGS  Left Ventricle: Left ventricular ejection fraction, by estimation, is 55 to 60%. The left ventricle has normal function. The left ventricle has no regional wall motion abnormalities. The average left ventricular global longitudinal strain is -15.9 %. The global longitudinal strain is normal. The left ventricular internal cavity size was normal in size. There is no left ventricular hypertrophy. Left ventricular diastolic parameters were normal. Normal left ventricular filling pressure. Right Ventricle: The right ventricular size is normal. No increase in right ventricular wall thickness. Right ventricular systolic function is normal. There is normal pulmonary artery systolic pressure.  The tricuspid regurgitant velocity is 1.78 m/s, and  with an assumed right atrial pressure of 3 mmHg, the estimated right ventricular systolic pressure is 97.9 mmHg. Left Atrium: Left atrial size was normal in size. Right Atrium: Right atrial size was normal in size. Pericardium: There is no evidence of pericardial effusion. Mitral Valve: The mitral valve is normal in structure. No evidence of mitral valve regurgitation. No evidence of mitral valve stenosis. Tricuspid Valve: The tricuspid valve is normal in structure. Tricuspid valve regurgitation is mild . No evidence of tricuspid stenosis. Aortic Valve: The aortic valve is normal in structure. Aortic valve regurgitation is not visualized. No aortic stenosis is present. Pulmonic Valve: The pulmonic valve was normal in structure. Pulmonic valve regurgitation is mild. No evidence of pulmonic stenosis. Aorta: The aortic root is normal in size and structure. Venous: The inferior vena cava is normal in size with greater than 50% respiratory variability, suggesting right atrial pressure of 3 mmHg. IAS/Shunts: No atrial level shunt detected by color flow Doppler.  LEFT VENTRICLE PLAX 2D LVIDd:         5.20 cm  Diastology LVIDs:         3.30 cm  LV e' medial:    10.20 cm/s LV PW:         1.00 cm  LV E/e' medial:  7.1 LV IVS:        0.80 cm  LV e' lateral:   14.50 cm/s LVOT diam:     2.40 cm  LV E/e' lateral: 5.0 LV SV:         87 LV SV Index:   38       2D  Longitudinal Strain LVOT Area:     4.52 cm 2D Strain GLS (A2C):   -16.4 %                         2D Strain GLS (A3C):   -15.8 %                         2D Strain GLS (A4C):   -15.6 %                         2D Strain GLS Avg:     -15.9 % RIGHT VENTRICLE RV Basal diam:  2.80 cm RV S prime:     10.90 cm/s TAPSE (M-mode): 2.3 cm LEFT ATRIUM             Index       RIGHT ATRIUM           Index LA diam:        3.80 cm 1.67 cm/m  RA Area:     13.60 cm LA Vol (A2C):   50.0 ml 22.02 ml/m RA Volume:   31.60 ml  13.92 ml/m  LA Vol (A4C):   57.7 ml 25.41 ml/m LA Biplane Vol: 56.0 ml 24.66 ml/m  AORTIC VALVE LVOT Vmax:   92.00 cm/s LVOT Vmean:  60.100 cm/s LVOT VTI:    0.192 m  AORTA Ao Root diam: 3.10 cm MITRAL VALVE               TRICUSPID VALVE MV Area (PHT): 7.99 cm    TR Peak grad:   12.7 mmHg MV Decel Time: 95 msec     TR Vmax:        178.00 cm/s MV E velocity: 72.80 cm/s MV A velocity: 52.30 cm/s  SHUNTS MV E/A ratio:  1.39        Systemic VTI:  0.19 m                            Systemic Diam: 2.40 cm Skeet Latch MD Electronically signed by Skeet Latch MD Signature Date/Time: 03/18/2020/10:57:23 AM    Final     Labs:  CBC: Recent Labs    02/13/20 0909 03/07/20 1342  WBC 5.6 8.0  HGB 12.3* 11.7*  HCT 37.7* 36.7*  PLT 316 304    COAGS: No results for input(s): INR, APTT in the last 8760 hours.  BMP: Recent Labs    02/13/20 0909 03/07/20 1342  NA 137 132*  K 4.2 3.9  CL 104 98  CO2 26 25  GLUCOSE 102* 95  BUN 14 14  CALCIUM 9.9 9.2  CREATININE 0.85 0.97  GFRNONAA >60 >60  GFRAA >60 >60    LIVER FUNCTION TESTS: Recent Labs    02/13/20 0909 03/07/20 1342  BILITOT 0.4 0.7  AST 17 30  ALT 26 45*  ALKPHOS 64 80  PROT 8.2* 8.1  ALBUMIN 3.3* 3.3*    TUMOR MARKERS: No results for input(s): AFPTM, CEA, CA199, CHROMGRNA in the last 8760 hours.  Assessment and Plan:  24 y.o. male with hx Chron's disease, anemia ,GERD and newly diagnosed Hodgkin's lymphoma who presents today for port a cath placement for chemotherapy. Risks and benefits of image guided port-a-catheter placement was discussed with the patient including, but not limited to bleeding, infection, pneumothorax, or fibrin sheath development and need for additional procedures.  All of the patient's questions were answered, patient is agreeable to proceed. Consent signed and in chart.    Thank you for this interesting consult.  I greatly enjoyed meeting Mark Decker and look forward to participating in  their care.  A copy of this report was sent to the requesting provider on this date.  Electronically Signed: D. Rowe Robert, PA-C 03/20/2020, 9:45 AM   I spent a total of 20 minutes  in face to face in clinical consultation, greater than 50% of which was counseling/coordinating care for port a cath placement

## 2020-03-20 NOTE — Sedation Documentation (Signed)
ETC02 40 

## 2020-03-20 NOTE — Discharge Instructions (Signed)
Implanted Port Insertion, Care After This sheet gives you information about how to care for yourself after your procedure. Your health care provider may also give you more specific instructions. If you have problems or questions, contact your health care provider. What can I expect after the procedure? After the procedure, it is common to have:  Discomfort at the port insertion site.  Bruising on the skin over the port. This should improve over 3-4 days. Follow these instructions at home: [facility] care  After your port is placed, you will get a manufacturer's information card. The card has information about your port. Keep this card with you at all times.  Take care of the port as told by your health care provider. Ask your health care provider if you or a family member can get training for taking care of the port at home. A home health care nurse may also take care of the port.  Make sure to remember what type of port you have. Incision care      Follow instructions from your health care provider about how to take care of your port insertion site. Make sure you: ? Wash your hands with soap and water before and after you change your bandage (dressing). If soap and water are not available, use hand sanitizer. ? Change your dressing as told by your health care provider. ? Leave stitches (sutures), skin glue, or adhesive strips in place. These skin closures may need to stay in place for 2 weeks or longer. If adhesive strip edges start to loosen and curl up, you may trim the loose edges. Do not remove adhesive strips completely unless your health care provider tells you to do that.  Check your port insertion site every day for signs of infection. Check for: ? Redness, swelling, or pain. ? Fluid or blood. ? Warmth. ? Pus or a bad smell. Activity  Return to your normal activities as told by your health care provider. Ask your health care provider what activities are safe for you.  Do not  lift anything that is heavier than 10 lb (4.5 kg), or the limit that you are told, until your health care provider says that it is safe. General instructions  Take over-the-counter and prescription medicines only as told by your health care provider.  Do not take baths, swim, or use a hot tub until your health care provider approves. Ask your health care provider if you may take showers. You may only be allowed to take sponge baths.  Do not drive for 24 hours if you were given a sedative during your procedure.  Wear a medical alert bracelet in case of an emergency. This will tell any health care providers that you have a port.  Keep all follow-up visits as told by your health care provider. This is important. Contact a health care provider if:  You cannot flush your port with saline as directed, or you cannot draw blood from the port.  You have a fever or chills.  You have redness, swelling, or pain around your port insertion site.  You have fluid or blood coming from your port insertion site.  Your port insertion site feels warm to the touch.  You have pus or a bad smell coming from the port insertion site. Get help right away if:  You have chest pain or shortness of breath.  You have bleeding from your port that you cannot control. Summary  Take care of the port as told by your health  care provider. Keep the manufacturer's information card with you at all times.  Change your dressing as told by your health care provider.  Contact a health care provider if you have a fever or chills or if you have redness, swelling, or pain around your port insertion site.  Keep all follow-up visits as told by your health care provider. This information is not intended to replace advice given to you by your health care provider. Make sure you discuss any questions you have with your health care provider. Document Revised: 01/10/2018 Document Reviewed: 01/10/2018 Elsevier Patient Education   [facility] An implanted port is a device that is placed under the skin. It is usually placed in the chest. The device can be used to give IV medicine, to take blood, or for dialysis. You may have an implanted port if:  You need IV medicine that would be irritating to the small veins in your hands or arms.  You need IV medicines, such as antibiotics, for a long period of time.  You need IV nutrition for a long period of time.  You need dialysis. Having a port means that your health care provider will not need to use the veins in your arms for these procedures. You may have fewer limitations when using a port than you would if you used other types of long-term IVs, and you will likely be able to return to normal activities after your incision heals. An implanted port has two main parts:  Reservoir. The reservoir is the part where a needle is inserted to give medicines or draw blood. The reservoir is round. After it is placed, it appears as a small, raised area under your skin.  Catheter. The catheter is a thin, flexible tube that connects the reservoir to a vein. Medicine that is inserted into the reservoir goes into the catheter and then into the vein. How is my port accessed? To access your port:  A numbing cream may be placed on the skin over the port site.  Your health care provider will put on a mask and sterile gloves.  The skin over your port will be cleaned carefully with a germ-killing soap and allowed to dry.  Your health care provider will gently pinch the port and insert a needle into it.  Your health care provider will check for a blood return to make sure the port is in the vein and is not clogged.  If your port needs to remain accessed to get medicine continuously (constant infusion), your health care provider will place a clear bandage (dressing) over the needle site. The dressing and needle will need to be changed every week, or as told  by your health care provider. What is flushing? Flushing helps keep the port from getting clogged. Follow instructions from your health care provider about how and when to flush the port. Ports are usually flushed with saline solution or a medicine called heparin. The need for flushing will depend on how the port is used:  If the port is only used from time to time to give medicines or draw blood, the port may need to be flushed: ? Before and after medicines have been given. ? Before and after blood has been drawn. ? As part of routine maintenance. Flushing may be recommended every 4-6 weeks.  If a constant infusion is running, the port may not need to be flushed.  Throw away any syringes in a disposal container that is meant  for sharp items (sharps container). You can buy a sharps container from a pharmacy, or you can make one by using an empty hard plastic bottle with a cover. How long will my port stay implanted? The port can stay in for as long as your health care provider thinks it is needed. When it is time for the port to come out, a surgery will be done to remove it. The surgery will be similar to the procedure that was done to put the port in. Follow these instructions at home:   Flush your port as told by your health care provider.  If you need an infusion over several days, follow instructions from your health care provider about how to take care of your port site. Make sure you: ? Wash your hands with soap and water before you change your dressing. If soap and water are not available, use alcohol-based hand sanitizer. ? Change your dressing as told by your health care provider. ? Place any used dressings or infusion bags into a plastic bag. Throw that bag in the trash. ? Keep the dressing that covers the needle clean and dry. Do not get it wet. ? Do not use scissors or sharp objects near the tube. ? Keep the tube clamped, unless it is being used.  Check your port site every day  for signs of infection. Check for: ? Redness, swelling, or pain. ? Fluid or blood. ? Pus or a bad smell.  Protect the skin around the port site. ? Avoid wearing bra straps that rub or irritate the site. ? Protect the skin around your port from seat belts. Place a soft pad over your chest if needed.  Bathe or shower as told by your health care provider. The site may get wet as long as you are not actively receiving an infusion.  Return to your normal activities as told by your health care provider. Ask your health care provider what activities are safe for you.  Carry a medical alert card or wear a medical alert bracelet at all times. This will let health care providers know that you have an implanted port in case of an emergency. Get help right away if:  You have redness, swelling, or pain at the port site.  You have fluid or blood coming from your port site.  You have pus or a bad smell coming from the port site.  You have a fever. Summary  Implanted ports are usually placed in the chest for long-term IV access.  Follow instructions from your health care provider about flushing the port and changing bandages (dressings).  Take care of the area around your port by avoiding clothing that puts pressure on the area, and by watching for signs of infection.  Protect the skin around your port from seat belts. Place a soft pad over your chest if needed.  Get help right away if you have a fever or you have redness, swelling, pain, drainage, or a bad smell at the port site. This information is not intended to replace advice given to you by your health care provider. Make sure you discuss any questions you have with your health care provider. Document Revised: 10/06/2018 Document Reviewed: 07/17/2016 Elsevier Patient Education  2020 [employer].

## 2020-03-20 NOTE — Sedation Documentation (Signed)
ETC02 = 41.

## 2020-03-20 NOTE — Sedation Documentation (Signed)
ETC02 monitor changed to portable 41

## 2020-03-21 ENCOUNTER — Ambulatory Visit (HOSPITAL_COMMUNITY)
Admission: RE | Admit: 2020-03-21 | Discharge: 2020-03-21 | Disposition: A | Discharge status: Home / Self Care | Payer: 59 | Source: Ambulatory Visit | Attending: Hematology and Oncology | Admitting: Hematology and Oncology

## 2020-03-21 DIAGNOSIS — C8111 Nodular sclerosis classical Hodgkin lymphoma, lymph nodes of head, face, and neck: Secondary | ICD-10-CM | POA: Diagnosis not present

## 2020-03-21 LAB — PULMONARY FUNCTION TEST
DL/VA % pred: 94 %
DL/VA: 4.63 ml/min/mmHg/L
DLCO cor % pred: 85 %
DLCO cor: 34.68 ml/min/mmHg
DLCO unc % pred: 78 %
DLCO unc: 31.96 ml/min/mmHg
FEF 25-75 Pre: 5.73 L/sec
FEF2575-%Pred-Pre: 106 %
FEV1-%Pred-Pre: 99 %
FEV1-Pre: 5.47 L
FEV1FVC-%Pred-Pre: 98 %
FEV6-%Pred-Pre: 100 %
FEV6-Pre: 6.73 L
FEV6FVC-%Pred-Pre: 101 %
FVC-%Pred-Pre: 99 %
FVC-Pre: 6.73 L
Pre FEV1/FVC ratio: 81 %
Pre FEV6/FVC Ratio: 100 %
RV % pred: 201 %
RV: 3.88 L
TLC % pred: 124 %
TLC: 10.29 L

## 2020-03-21 MED ORDER — ALBUTEROL SULFATE (2.5 MG/3ML) 0.083% IN NEBU: 2.5000 mg | INHALATION_SOLUTION | Freq: Once | RESPIRATORY_TRACT | Status: DC

## 2020-03-24 ENCOUNTER — Other Ambulatory Visit: Payer: Self-pay

## 2020-03-24 ENCOUNTER — Inpatient Hospital Stay: Payer: 59

## 2020-03-24 VITALS — BP 126/68 | HR 88 | Temp 99.8°F | Resp 16

## 2020-03-24 DIAGNOSIS — D509 Iron deficiency anemia, unspecified: Secondary | ICD-10-CM

## 2020-03-24 DIAGNOSIS — C8111 Nodular sclerosis classical Hodgkin lymphoma, lymph nodes of head, face, and neck: Secondary | ICD-10-CM

## 2020-03-24 DIAGNOSIS — Z5111 Encounter for antineoplastic chemotherapy: Secondary | ICD-10-CM | POA: Diagnosis not present

## 2020-03-24 DIAGNOSIS — K50919 Crohn's disease, unspecified, with unspecified complications: Secondary | ICD-10-CM

## 2020-03-24 LAB — CMP (CANCER CENTER ONLY)
ALT: 40 U/L (ref 0–44)
AST: 24 U/L (ref 15–41)
Albumin: 3.1 g/dL — ABNORMAL LOW (ref 3.5–5.0)
Alkaline Phosphatase: 76 U/L (ref 38–126)
Anion gap: 7 (ref 5–15)
BUN: 11 mg/dL (ref 6–20)
CO2: 26 mmol/L (ref 22–32)
Calcium: 9.4 mg/dL (ref 8.9–10.3)
Chloride: 102 mmol/L (ref 98–111)
Creatinine: 0.85 mg/dL (ref 0.61–1.24)
GFR, Est AFR Am: >60 mL/min (ref >60)
GFR, Estimated: >60 mL/min (ref >60)
Glucose, Bld: 89 mg/dL (ref 70–99)
Potassium: 4.1 mmol/L (ref 3.5–5.1)
Sodium: 135 mmol/L (ref 135–145)
Total Bilirubin: 0.5 mg/dL (ref 0.3–1.2)
Total Protein: 8.4 g/dL — ABNORMAL HIGH (ref 6.5–8.1)

## 2020-03-24 MED ORDER — SODIUM CHLORIDE 0.9% FLUSH
10.0000 mL | INTRAVENOUS | Status: DC | PRN
  Administered 2020-03-24: 10 mL
  Filled 2020-03-24: qty 10

## 2020-03-24 MED ORDER — SODIUM CHLORIDE 0.9 % IV SOLN
375.0000 mg/m2 | Freq: Once | INTRAVENOUS | Status: AC
  Administered 2020-03-24: 850 mg via INTRAVENOUS
  Filled 2020-03-24: qty 85

## 2020-03-24 MED ORDER — HEPARIN SOD (PORK) LOCK FLUSH 100 UNIT/ML IV SOLN
500.0000 [IU] | Freq: Once | INTRAVENOUS | Status: AC | PRN
  Administered 2020-03-24: 500 [IU]
  Filled 2020-03-24: qty 5

## 2020-03-24 MED ORDER — PALONOSETRON HCL INJECTION 0.25 MG/5ML
0.2500 mg | Freq: Once | INTRAVENOUS | Status: AC
  Administered 2020-03-24: 0.25 mg via INTRAVENOUS

## 2020-03-24 MED ORDER — VINBLASTINE SULFATE CHEMO INJECTION 1 MG/ML
6.2000 mg/m2 | Freq: Once | INTRAVENOUS | Status: AC
  Administered 2020-03-24: 14 mg via INTRAVENOUS
  Filled 2020-03-24: qty 14

## 2020-03-24 MED ORDER — SODIUM CHLORIDE 0.9% FLUSH
10.0000 mL | Freq: Once | INTRAVENOUS | Status: DC
  Filled 2020-03-24: qty 10

## 2020-03-24 MED ORDER — PALONOSETRON HCL INJECTION 0.25 MG/5ML
INTRAVENOUS | Status: AC
  Filled 2020-03-24: qty 5

## 2020-03-24 MED ORDER — SODIUM CHLORIDE 0.9 % IV SOLN
Freq: Once | INTRAVENOUS | Status: AC
  Filled 2020-03-24: qty 250

## 2020-03-24 MED ORDER — SODIUM CHLORIDE 0.9 % IV SOLN
150.0000 mg | Freq: Once | INTRAVENOUS | Status: AC
  Administered 2020-03-24: 150 mg via INTRAVENOUS
  Filled 2020-03-24: qty 150

## 2020-03-24 MED ORDER — SODIUM CHLORIDE 0.9 % IV SOLN
10.0000 mg | Freq: Once | INTRAVENOUS | Status: AC
  Administered 2020-03-24: 10 mg via INTRAVENOUS
  Filled 2020-03-24: qty 10

## 2020-03-24 MED ORDER — DOXORUBICIN HCL CHEMO IV INJECTION 2 MG/ML
25.0000 mg/m2 | Freq: Once | INTRAVENOUS | Status: AC
  Administered 2020-03-24: 56 mg via INTRAVENOUS
  Filled 2020-03-24: qty 28

## 2020-03-24 MED ORDER — SODIUM CHLORIDE 0.9 % IV SOLN
10.0000 [IU]/m2 | Freq: Once | INTRAVENOUS | Status: AC
  Administered 2020-03-24: 23 [IU] via INTRAVENOUS
  Filled 2020-03-24: qty 7.67

## 2020-03-24 MED ORDER — SODIUM CHLORIDE 0.9 % IV SOLN
510.0000 mg | Freq: Once | INTRAVENOUS | Status: AC
  Administered 2020-03-24: 510 mg via INTRAVENOUS
  Filled 2020-03-24: qty 510

## 2020-03-24 NOTE — Patient Instructions (Signed)
[facility] Discharge Instructions for Patients Receiving Chemotherapy  Today you received the following chemotherapy agents  Doxorubicin, Vinblastine, bleomycin, DTIC   To help prevent nausea and vomiting after your treatment, we encourage you to take your nausea medication If you develop nausea and vomiting that is not controlled by your nausea medication, call the clinic  BELOW ARE SYMPTOMS THAT SHOULD BE REPORTED IMMEDIATELY:  *FEVER GREATER THAN 100.5 F  *CHILLS WITH OR WITHOUT FEVER  NAUSEA AND VOMITING THAT IS NOT CONTROLLED WITH YOUR NAUSEA MEDICATION  *UNUSUAL SHORTNESS OF BREATH  *UNUSUAL BRUISING OR BLEEDING  TENDERNESS IN MOUTH AND THROAT WITH OR WITHOUT PRESENCE OF ULCERS  *URINARY PROBLEMS  *BOWEL PROBLEMS  UNUSUAL RASH Items with * indicate a potential emergency and should be followed up as soon as possible.  Feel free to call the clinic should you have any questions or concerns. The clinic phone number is [phone].  Please show the [place] at check-in to the Emergency Department and triage nurse. Ferumoxytol injection What is this medicine? FERUMOXYTOL is an iron complex. Iron is used to make healthy red blood cells, which carry oxygen and nutrients throughout the body. This medicine is used to treat iron deficiency anemia. This medicine may be used for other purposes; ask your health care provider or pharmacist if you have questions. COMMON BRAND NAME(S): Feraheme What should I tell my health care provider before I take this medicine? They need to know if you have any of these conditions:  anemia not caused by low iron levels  high levels of iron in the blood  magnetic resonance imaging (MRI) test scheduled  an unusual or allergic reaction to iron, other medicines, foods, dyes, or preservatives  pregnant or trying to get pregnant  breast-feeding How should I use this medicine? This medicine is for injection into a vein.  It is given by a health care professional in a hospital or clinic setting. Talk to your pediatrician regarding the use of this medicine in children. Special care may be needed. Overdosage: If you think you have taken too much of this medicine contact a poison control center or emergency room at once. NOTE: This medicine is only for you. Do not share this medicine with others. What if I miss a dose? It is important not to miss your dose. Call your doctor or health care professional if you are unable to keep an appointment. What may interact with this medicine? This medicine may interact with the following medications:  other iron products This list may not describe all possible interactions. Give your health care provider a list of all the medicines, herbs, non-prescription drugs, or dietary supplements you use. Also tell them if you smoke, drink alcohol, or use illegal drugs. Some items may interact with your medicine. What should I watch for while using this medicine? Visit your doctor or healthcare professional regularly. Tell your doctor or healthcare professional if your symptoms do not start to get better or if they get worse. You may need blood work done while you are taking this medicine. You may need to follow a special diet. Talk to your doctor. Foods that contain iron include: whole grains/cereals, dried fruits, beans, or peas, leafy green vegetables, and organ meats (liver, kidney). What side effects may I notice from receiving this medicine? Side effects that you should report to your doctor or health care professional as soon as possible:  allergic reactions like skin rash, itching or hives, swelling of the  face, lips, or tongue  breathing problems  changes in blood pressure  feeling faint or lightheaded, falls  fever or chills  flushing, sweating, or hot feelings  swelling of the ankles or feet Side effects that usually do not require medical attention (report to your  doctor or health care professional if they continue or are bothersome):  diarrhea  headache  nausea, vomiting  stomach pain This list may not describe all possible side effects. Call your doctor for medical advice about side effects. You may report side effects to FDA at 1-800-FDA-1088. Where should I keep my medicine? This drug is given in a hospital or clinic and will not be stored at home. NOTE: This sheet is a summary. It may not cover all possible information. If you have questions about this medicine, talk to your doctor, pharmacist, or health care provider.  2020 Elsevier/Gold Standard (2016-08-02 20:21:10)

## 2020-03-24 NOTE — Patient Instructions (Signed)
[employer], Adult A central line is a soft, flexible tube (catheter) that can be used to collect blood for testing or to give medicine through a vein. The tip of the central line ends in a large vein just above the heart (vena cava). A central line may be placed because:  You need to get medicines or fluids through an IV tube for a long period of time.  You need nutrition but cannot eat or absorb nutrients.  The veins in your hands or arms are hard to access.  You need a blood transfusion.  You need chemotherapy or dialysis. Types of central lines There are four main types of central lines:  Peripherally inserted central catheter (PICC) line. This type is used for intermediate access to long-term access of one week or more. It can be used to draw blood and give fluids or medicines. A PICC looks like an IV tube, but it goes up the arm to the heart. It is usually inserted in the upper arm and taped in place on the arm.  Tunneled central line. This type is used for long-term therapy and dialysis. It is placed in a large vein in the neck, chest, or groin. It is inserted through a small incision made over the vein, then it is advanced into the heart. It is tunneled under the skin and brought out through a second incision.  Non-tunneled central line. This type is used for short-term access, usually of a maximum of 7 days. It is often used in the emergency department. It is inserted in the neck, chest, or groin.  Implanted port. This type is used for long-term therapy. It can stay in place longer than other types of central lines. It is normally inserted in the upper chest, but it can also be placed in the upper arm or the abdomen. It is inserted and removed with surgery, and it is accessed using a needle. The type of central line that you receive depends on how long you will need it, your medical condition, and the condition of your veins. What are the risks? Using any type of central line has  risks to be aware of, including:  Infection.  A blood clot that blocks the central line or forms in the vein and travels to the heart.  Bleeding from the place where the central line was inserted.  Developing a hole or crack within the central line. If this happens, the central line will need to be replaced.  Central line failure. Follow these instructions at home: Flushing and cleaning the central line   Follow instructions from your health care provider about flushing and cleaning the central line and the area around it.  Only flush with sterile supplies. Use supplies from your health care provider, a pharmacy, or another source that is recommended by your health care provider.  Before you flush the central line or clean the central line or the area around it: ? Wash your hands with soap and water or alcohol-based hand sanitizer. ? Clean the central line hub with rubbing alcohol. Caring for the incision or central line site  Check your incision or central line site every day for signs of infection. Check for: ? Redness, swelling, or pain. ? Fluid or blood. ? Warmth. ? Pus or a bad smell. General instructions  Follow instructions from your health care provider for the type of device that you have.  Keep the tube clamped, unless it is being used.  Keep your supplies in  a clean, dry location.  If the central line accidentally gets pulled on, make sure: ? The bandage (dressing) is okay. ? There is no bleeding. ? The line has not been pulled out.  Do not use scissors or sharp objects near the tube.  Do not swim or let the central line soak in the tub.  Ask your health care provider what activities are safe for you. You may be restricted from lifting or making repetitive arm movements on the side of your central line.  Take over-the-counter and prescription medicines only as told by your health care provider.  Keep the insertion site of your central line clean and dry at  all times.  Keep your dressing dry. If it gets wet, have it changed as soon as possible.  Keep all follow-up visits as told by your health care provider. This is important. Disposal of supplies  Throw away any syringes in a disposal container that is meant for sharp items (sharps container). You can buy a sharps container from a pharmacy, or you can make one by using an empty hard plastic bottle with a cover.  Place any used dressings or infusion bags into a plastic bag. Throw that bag in the trash. Contact a health care provider if:  You have redness, swelling, or pain around your incision.  You have fluid or blood coming from your incision.  Your incision feels warm to the touch.  You have pus or a bad smell coming from your incision. Get help right away if:  You have: ? A fever or chills. ? Shortness of breath. ? Chest pain or a racing heart beat. ? Swelling in your neck, face, chest, or arm on the side of your central line.  You feel dizzy or you faint.  Your incision or central line site has red streaks spreading away from the area.  Your incision or central line site is bleeding and does not stop.  Your central line is difficult to flush or will not flush.  You do not get a blood return from the central line.  Your central line gets loose or damaged or comes out.  Your catheter leaks when flushed or when fluids are infused into it. Summary  A central line is a soft, flexible tube (catheter) that can be used to give medicine through a vein.  Follow specific instructions from your health care provider for the type of device that you have.  Keep the insertion site of your central line clean and dry at all times.  Keep the tube clamped, unless it is being used. This information is not intended to replace advice given to you by your health care provider. Make sure you discuss any questions you have with your health care provider. Document Revised: 10/04/2018  Document Reviewed: 07/16/2016 Elsevier Patient Education  [place].

## 2020-03-25 ENCOUNTER — Encounter: Payer: Self-pay | Admitting: Hematology and Oncology

## 2020-03-25 ENCOUNTER — Telehealth: Payer: Self-pay

## 2020-03-25 NOTE — Telephone Encounter (Signed)
Called regarding chemo treatment yesterday. He is doing well today. Eating and drinking with no problems. Yesterday with treatment he had a low grade temperature. He checked his temperature today and it is 96.3. When the nurse de- accessed his port yesterday. The glue got pulled off and he is concerned about the port site. Instructed to send a picture of port on mychart. He verbalized understanding.

## 2020-03-25 NOTE — Telephone Encounter (Signed)
I replied to his mychart msg

## 2020-03-25 NOTE — Telephone Encounter (Signed)
-----   Message from Tildon Husky, RN sent at 03/24/2020  2:31 PM EDT ----- Regarding: First time chemo follow up patient of Dr Alvy Bimler First time ABVD patient of Dr Alvy Bimler. Patient tolerated treatment well

## 2020-03-29 ENCOUNTER — Encounter: Payer: Self-pay | Admitting: Hematology and Oncology

## 2020-04-02 ENCOUNTER — Telehealth: Payer: Self-pay

## 2020-04-02 NOTE — Telephone Encounter (Signed)
He called and left a message. He has been reading online about his chemo treatment. He read that it can decrease Testerone levels. He is asking if you can order Testerone level with next lab draw? That way he has a baseline.

## 2020-04-02 NOTE — Telephone Encounter (Signed)
It will be low Insurance may not pay for it If his insurance will not pay, is he willing to pay out of pocket?

## 2020-04-02 NOTE — Telephone Encounter (Signed)
Called and given below message. He verbalized understanding. He will call the office back with answer.

## 2020-04-07 ENCOUNTER — Inpatient Hospital Stay: Payer: 59 | Attending: Hematology and Oncology

## 2020-04-07 ENCOUNTER — Inpatient Hospital Stay (HOSPITAL_BASED_OUTPATIENT_CLINIC_OR_DEPARTMENT_OTHER): Payer: 59 | Admitting: Hematology and Oncology

## 2020-04-07 ENCOUNTER — Inpatient Hospital Stay: Payer: 59

## 2020-04-07 ENCOUNTER — Encounter: Payer: Self-pay | Admitting: Hematology and Oncology

## 2020-04-07 ENCOUNTER — Other Ambulatory Visit: Payer: Self-pay

## 2020-04-07 VITALS — BP 125/95 | HR 82 | Temp 98.3°F | Resp 17

## 2020-04-07 DIAGNOSIS — R748 Abnormal levels of other serum enzymes: Secondary | ICD-10-CM | POA: Diagnosis not present

## 2020-04-07 DIAGNOSIS — D509 Iron deficiency anemia, unspecified: Secondary | ICD-10-CM

## 2020-04-07 DIAGNOSIS — R110 Nausea: Secondary | ICD-10-CM

## 2020-04-07 DIAGNOSIS — C8190 Hodgkin lymphoma, unspecified, unspecified site: Secondary | ICD-10-CM | POA: Insufficient documentation

## 2020-04-07 DIAGNOSIS — K50919 Crohn's disease, unspecified, with unspecified complications: Secondary | ICD-10-CM

## 2020-04-07 DIAGNOSIS — Z5111 Encounter for antineoplastic chemotherapy: Secondary | ICD-10-CM | POA: Insufficient documentation

## 2020-04-07 DIAGNOSIS — C8111 Nodular sclerosis classical Hodgkin lymphoma, lymph nodes of head, face, and neck: Secondary | ICD-10-CM

## 2020-04-07 DIAGNOSIS — R590 Localized enlarged lymph nodes: Secondary | ICD-10-CM

## 2020-04-07 DIAGNOSIS — T451X5A Adverse effect of antineoplastic and immunosuppressive drugs, initial encounter: Secondary | ICD-10-CM

## 2020-04-07 DIAGNOSIS — D701 Agranulocytosis secondary to cancer chemotherapy: Secondary | ICD-10-CM

## 2020-04-07 LAB — CBC WITH DIFFERENTIAL/PLATELET
Abs Immature Granulocytes: 0.01 10*3/uL (ref 0.00–0.07)
Basophils Absolute: 0.1 10*3/uL (ref 0.0–0.1)
Basophils Relative: 1 %
Eosinophils Absolute: 0.2 10*3/uL (ref 0.0–0.5)
Eosinophils Relative: 4 %
HCT: 39.3 % (ref 39.0–52.0)
Hemoglobin: 12.3 g/dL — ABNORMAL LOW (ref 13.0–17.0)
Immature Granulocytes: 0 %
Lymphocytes Relative: 43 %
Lymphs Abs: 1.9 10*3/uL (ref 0.7–4.0)
MCH: 25.6 pg — ABNORMAL LOW (ref 26.0–34.0)
MCHC: 31.3 g/dL (ref 30.0–36.0)
MCV: 81.7 fL (ref 80.0–100.0)
Monocytes Absolute: 1 10*3/uL (ref 0.1–1.0)
Monocytes Relative: 23 %
Neutro Abs: 1.3 10*3/uL — ABNORMAL LOW (ref 1.7–7.7)
Neutrophils Relative %: 29 %
Platelets: 291 10*3/uL (ref 150–400)
RBC: 4.81 MIL/uL (ref 4.22–5.81)
RDW: 19 % — ABNORMAL HIGH (ref 11.5–15.5)
WBC: 4.5 10*3/uL (ref 4.0–10.5)
nRBC: 0 % (ref 0.0–0.2)

## 2020-04-07 LAB — COMPREHENSIVE METABOLIC PANEL
ALT: 144 U/L — ABNORMAL HIGH (ref 0–44)
AST: 55 U/L — ABNORMAL HIGH (ref 15–41)
Albumin: 3.7 g/dL (ref 3.5–5.0)
Alkaline Phosphatase: 66 U/L (ref 38–126)
Anion gap: 6 (ref 5–15)
BUN: 20 mg/dL (ref 6–20)
CO2: 28 mmol/L (ref 22–32)
Calcium: 9.7 mg/dL (ref 8.9–10.3)
Chloride: 104 mmol/L (ref 98–111)
Creatinine, Ser: 0.88 mg/dL (ref 0.61–1.24)
GFR, Estimated: >60 mL/min (ref >60)
Glucose, Bld: 84 mg/dL (ref 70–99)
Potassium: 4.2 mmol/L (ref 3.5–5.1)
Sodium: 138 mmol/L (ref 135–145)
Total Bilirubin: 0.5 mg/dL (ref 0.3–1.2)
Total Protein: 8.1 g/dL (ref 6.5–8.1)

## 2020-04-07 MED ORDER — DOXORUBICIN HCL CHEMO IV INJECTION 2 MG/ML
20.0000 mg/m2 | Freq: Once | INTRAVENOUS | Status: AC
  Administered 2020-04-07: 46 mg via INTRAVENOUS
  Filled 2020-04-07: qty 23

## 2020-04-07 MED ORDER — SODIUM CHLORIDE 0.9 % IV SOLN
300.0000 mg/m2 | Freq: Once | INTRAVENOUS | Status: AC
  Administered 2020-04-07: 680 mg via INTRAVENOUS
  Filled 2020-04-07: qty 68

## 2020-04-07 MED ORDER — SODIUM CHLORIDE 0.9 % IV SOLN
150.0000 mg | Freq: Once | INTRAVENOUS | Status: AC
  Administered 2020-04-07: 150 mg via INTRAVENOUS
  Filled 2020-04-07: qty 150

## 2020-04-07 MED ORDER — VINBLASTINE SULFATE CHEMO INJECTION 1 MG/ML
4.8500 mg/m2 | Freq: Once | INTRAVENOUS | Status: AC
  Administered 2020-04-07: 11 mg via INTRAVENOUS
  Filled 2020-04-07: qty 11

## 2020-04-07 MED ORDER — SODIUM CHLORIDE 0.9 % IV SOLN
8.0000 [IU]/m2 | Freq: Once | INTRAVENOUS | Status: AC
  Administered 2020-04-07: 18 [IU] via INTRAVENOUS
  Filled 2020-04-07: qty 6

## 2020-04-07 MED ORDER — PALONOSETRON HCL INJECTION 0.25 MG/5ML
0.2500 mg | Freq: Once | INTRAVENOUS | Status: AC
  Administered 2020-04-07: 0.25 mg via INTRAVENOUS

## 2020-04-07 MED ORDER — SODIUM CHLORIDE 0.9% FLUSH
10.0000 mL | Freq: Once | INTRAVENOUS | Status: AC
  Administered 2020-04-07: 10 mL
  Filled 2020-04-07: qty 10

## 2020-04-07 MED ORDER — PALONOSETRON HCL INJECTION 0.25 MG/5ML
INTRAVENOUS | Status: AC
  Filled 2020-04-07: qty 5

## 2020-04-07 MED ORDER — SODIUM CHLORIDE 0.9 % IV SOLN
10.0000 mg | Freq: Once | INTRAVENOUS | Status: AC
  Administered 2020-04-07: 10 mg via INTRAVENOUS
  Filled 2020-04-07: qty 10

## 2020-04-07 MED ORDER — SODIUM CHLORIDE 0.9 % IV SOLN
510.0000 mg | Freq: Once | INTRAVENOUS | Status: AC
  Administered 2020-04-07: 510 mg via INTRAVENOUS
  Filled 2020-04-07: qty 510

## 2020-04-07 MED ORDER — SODIUM CHLORIDE 0.9 % IV SOLN
Freq: Once | INTRAVENOUS | Status: AC
  Filled 2020-04-07: qty 250

## 2020-04-07 MED ORDER — HEPARIN SOD (PORK) LOCK FLUSH 100 UNIT/ML IV SOLN
500.0000 [IU] | Freq: Once | INTRAVENOUS | Status: AC | PRN
  Administered 2020-04-07: 500 [IU]
  Filled 2020-04-07: qty 5

## 2020-04-07 MED ORDER — SODIUM CHLORIDE 0.9% FLUSH
10.0000 mL | INTRAVENOUS | Status: DC | PRN
  Administered 2020-04-07: 10 mL
  Filled 2020-04-07: qty 10

## 2020-04-07 NOTE — Progress Notes (Signed)
[facility] OFFICE PROGRESS NOTE  Patient Care Team: [name], MD as PCP - General (Family Medicine) [name], MD as Attending Physician (Family Medicine) [name], MD (Inactive) (Gastroenterology)  ASSESSMENT & PLAN:  Hodgkin lymphoma ([place]) Overall, he has excellent response to therapy The lymphadenopathy has resolved However, his treatment is complicated slightly by neutropenia, elevated liver enzymes, nausea, constipation as well as mild weight gain  I plan to reduce the dose of chemotherapy by 20% We will proceed without delay  Leukopenia due to antineoplastic chemotherapy [facility]) This is due to recent treatment As above, we will proceed with dose adjustment  Elevated liver enzymes This is due to treatment As above, plan reduced dose chemo  Crohn disease ([place]) He had minor flare of Crohn's disease I informed the patient of my discussion with gastroenterologist The plan would be start him on prednisone if his Crohn's disease flares up  Nausea without vomiting He has antiemetics to take as needed   No orders of the defined types were placed in this encounter.   All questions were answered. The patient knows to call the clinic with any problems, questions or concerns. The total time spent in the appointment was 30 minutes encounter with patients including review of chart and various tests results, discussions about plan of care and coordination of care plan   [name], MD 04/07/2020 1:39 PM  INTERVAL HISTORY: Please see below for problem oriented charting. He is seen prior to cycle 1, day 15 of treatment Lymphadenopathy and night sweats has resolved He has some mild nausea Has mild constipation and rectal bleeding recently, self-limiting Denies peripheral neuropathy No recent infection, fever or chills SUMMARY OF ONCOLOGIC HISTORY: Oncology History  Hodgkin lymphoma ([place])  03/10/2020 Pathology Results   A. LYMPH NODE, RIGHT  SUBMANDIBULAR, BIOPSY:  -  Classic Hodgkin lymphoma  -  See comment   COMMENT:   The submitted lymph nodes are effaced by a vaguely nodular lymphoid proliferation separated by bands of fibrosis with admixed inflammatory cells including neutrophils and eosinophils. The lymphoid proliferation  is composed predominantly of small mature lymphocytes with scattered large atypical cells. The atypical cells have mono and multi-lobated nuclei and prominent nucleoli characteristic of Reed-Sternberg cells.  Lacunar cells are also present.   The neoplastic cells are CD30, CD15 (subset), Pax-5 (dim) and CD20 positive by immunohistochemistry. EBV by in-situ hybridization is positive in the Reed-Sternberg cells. CD3 highlights the background  T-cells.  EBV in-situ hybridization is negative.   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    03/14/2020 Initial Diagnosis   Hodgkin lymphoma ([place])   03/14/2020 Cancer Staging   Staging form: Hodgkin and Non-Hodgkin Lymphoma, AJCC 8th Edition - Clinical stage from 03/14/2020: Stage I (Hodgkin lymphoma, B - Symptoms) - Signed by [name], MD on 03/14/2020   03/18/2020 Echocardiogram   1. Left ventricular ejection fraction, by estimation, is 55 to 60%. The left ventricle has normal function. The left ventricle has no regional wall motion abnormalities. Left ventricular diastolic parameters were normal. The average left ventricular global longitudinal strain is -15.9 %. The global longitudinal strain is normal.  2. Right ventricular systolic function is normal. The right ventricular size is normal. There is normal pulmonary artery systolic pressure.  3. The mitral valve is normal in structure. No evidence of mitral valve regurgitation. No evidence of mitral stenosis.  4. The aortic valve is normal in structure. Aortic  valve regurgitation is not visualized. No aortic  stenosis is present.  5. The inferior vena cava is normal in size with greater than 50% respiratory variability, suggesting right atrial pressure of 3 mmHg.   03/20/2020 Procedure   Placement of a subcutaneous port device. Catheter tip at the superior cavoatrial junction.     REVIEW OF SYSTEMS:   Constitutional: Denies fevers, chills or abnormal weight loss Eyes: Denies blurriness of vision Ears, nose, mouth, throat, and face: Denies mucositis or sore throat Respiratory: Denies cough, dyspnea or wheezes Cardiovascular: Denies palpitation, chest discomfort or lower extremity swelling Skin: Denies abnormal skin rashes Lymphatics: Denies new lymphadenopathy or easy bruising Neurological:Denies numbness, tingling or new weaknesses Behavioral/Psych: Mood is stable, no new changes  All other systems were reviewed with the patient and are negative.  I have reviewed the past medical history, past surgical history, social history and family history with the patient and they are unchanged from previous note.  ALLERGIES:  is allergic to amoxicillin.  MEDICATIONS:  Current Outpatient Medications  Medication Sig Dispense Refill  . acetaminophen (TYLENOL) 500 MG tablet Take 1,000 mg by mouth every 6 (six) hours as needed for moderate pain or headache.    . Carboxymethylcellul-Glycerin (LUBRICATING EYE DROPS OP) Place 1 drop into both eyes daily as needed (dry eyes).    . Cholecalciferol (VITAMIN D) 50 MCG (2000 UT) tablet Take 6,000 Units by mouth daily.    Marland Kitchen EPINEPHrine 0.3 mg/0.3 mL IJ SOAJ injection Inject 0.3 mg into the muscle as needed for anaphylaxis.    . ferrous sulfate 325 (65 FE) MG tablet Take 325 mg by mouth daily.    Marland Kitchen lidocaine-prilocaine (EMLA) cream Apply to affected area once (Patient taking differently: Apply 1 application topically daily as needed (port access). ) 30 g 3  . Magnesium 250 MG TABS Take 250 mg by mouth daily.    . Multiple Vitamins-Minerals (ZINC PO) Take 2  tablets by mouth daily. With quercetin    . ondansetron (ZOFRAN) 8 MG tablet Take 1 tablet (8 mg total) by mouth every 8 (eight) hours as needed. Start on the third day after chemotherapy. (Patient taking differently: Take 8 mg by mouth every 8 (eight) hours as needed for nausea or vomiting. Start on the third day after chemotherapy.) 30 tablet 1  . OVER THE COUNTER MEDICATION Take 2 capsules by mouth at bedtime as needed (sleep). Stress Relief otc supplement    . oxymetazoline (AFRIN) 0.05 % nasal spray Place 1 spray into both nostrils 2 (two) times daily as needed for congestion.    . polyethylene glycol (MIRALAX / GLYCOLAX) 17 g packet Take 17 g by mouth daily.    . prochlorperazine (COMPAZINE) 10 MG tablet Take 1 tablet (10 mg total) by mouth every 6 (six) hours as needed (Nausea or vomiting). 30 tablet 1  . SUPER B COMPLEX/C PO Take 1 tablet by mouth daily.    . TURMERIC PO Take 2 capsules by mouth daily.     No current facility-administered medications for this visit.   Facility-Administered Medications Ordered in Other Visits  Medication Dose Route Frequency Provider Last Rate Last Admin  . bleomycin (BLEOCIN) 18 Units in sodium chloride 0.9 % 50 mL chemo infusion  8 Units/m2 (Treatment Plan Recorded) Intravenous Once Krystopher Kuenzel, MD      . dacarbazine (DTIC) 680 mg in sodium chloride 0.9 % 250 mL chemo infusion  300 mg/m2 (Treatment Plan Recorded) Intravenous Once [name], MD      .  DOXOrubicin (ADRIAMYCIN) chemo injection 46 mg  20 mg/m2 (Treatment Plan Recorded) Intravenous Once Alvy Bimler, Rodriquez Thorner, MD      . heparin lock flush 100 unit/mL  500 Units Intracatheter Once PRN Alvy Bimler, Zackari Ruane, MD      . sodium chloride flush (NS) 0.9 % injection 10 mL  10 mL Intracatheter PRN Alvy Bimler, Rossanna Spitzley, MD      . vinBLAStine (VELBAN) 11 mg in sodium chloride 0.9 % 50 mL chemo infusion  4.85 mg/m2 (Treatment Plan Recorded) Intravenous Once [name], MD        PHYSICAL EXAMINATION: ECOG PERFORMANCE STATUS: 1 -  Symptomatic but completely ambulatory  Vitals:   04/07/20 1133  BP: 127/82  Pulse: 84  Resp: 18  Temp: (!) 97.2 F (36.2 C)  SpO2: 99%   Filed Weights   04/07/20 1133  Weight: 212 lb 1.6 oz (96.2 kg)    GENERAL:alert, no distress and comfortable SKIN: skin color, texture, turgor are normal, no rashes or significant lesions EYES: normal, Conjunctiva are pink and non-injected, sclera clear OROPHARYNX:no exudate, no erythema and lips, buccal mucosa, and tongue normal  NECK: supple, thyroid normal size, non-tender, without nodularity LYMPH:  no palpable lymphadenopathy in the cervical, axillary or inguinal LUNGS: clear to auscultation and percussion with normal breathing effort HEART: regular rate & rhythm and no murmurs and no lower extremity edema ABDOMEN:abdomen soft, non-tender and normal bowel sounds Musculoskeletal:no cyanosis of digits and no clubbing  NEURO: alert & oriented x 3 with fluent speech, no focal motor/sensory deficits  LABORATORY DATA:  I have reviewed the data as listed    Component Value Date/Time   NA 138 04/07/2020 1109   NA 141 04/27/2013 1217   K 4.2 04/07/2020 1109   K 4.0 04/27/2013 1217   CL 104 04/07/2020 1109   CL 106 04/26/2012 1550   CO2 28 04/07/2020 1109   CO2 28 04/27/2013 1217   GLUCOSE 84 04/07/2020 1109   GLUCOSE 92 04/27/2013 1217   GLUCOSE 108 (H) 04/26/2012 1550   BUN 20 04/07/2020 1109   BUN 10.2 04/27/2013 1217   CREATININE 0.88 04/07/2020 1109   CREATININE 0.85 03/24/2020 0853   CREATININE 0.8 04/27/2013 1217   CALCIUM 9.7 04/07/2020 1109   CALCIUM 9.7 04/27/2013 1217   PROT 8.1 04/07/2020 1109   PROT 7.5 04/27/2013 1217   ALBUMIN 3.7 04/07/2020 1109   ALBUMIN 3.6 04/27/2013 1217   AST 55 (H) 04/07/2020 1109   AST 24 03/24/2020 0853   AST 14 04/27/2013 1217   ALT 144 (H) 04/07/2020 1109   ALT 40 03/24/2020 0853   ALT 23 04/27/2013 1217   ALKPHOS 66 04/07/2020 1109   ALKPHOS 64 04/27/2013 1217   BILITOT 0.5  04/07/2020 1109   BILITOT 0.5 03/24/2020 0853   BILITOT 0.86 04/27/2013 1217   GFRNONAA >60 04/07/2020 1109   GFRNONAA >60 03/24/2020 0853   GFRAA >60 03/24/2020 0853    No results found for: SPEP, UPEP  Lab Results  Component Value Date   WBC 4.5 04/07/2020   NEUTROABS 1.3 (L) 04/07/2020   HGB 12.3 (L) 04/07/2020   HCT 39.3 04/07/2020   MCV 81.7 04/07/2020   PLT 291 04/07/2020      Chemistry      Component Value Date/Time   NA 138 04/07/2020 1109   NA 141 04/27/2013 1217   K 4.2 04/07/2020 1109   K 4.0 04/27/2013 1217   CL 104 04/07/2020 1109   CL 106 04/26/2012 1550   CO2 28  04/07/2020 1109   CO2 28 04/27/2013 1217   BUN 20 04/07/2020 1109   BUN 10.2 04/27/2013 1217   CREATININE 0.88 04/07/2020 1109   CREATININE 0.85 03/24/2020 0853   CREATININE 0.8 04/27/2013 1217      Component Value Date/Time   CALCIUM 9.7 04/07/2020 1109   CALCIUM 9.7 04/27/2013 1217   ALKPHOS 66 04/07/2020 1109   ALKPHOS 64 04/27/2013 1217   AST 55 (H) 04/07/2020 1109   AST 24 03/24/2020 0853   AST 14 04/27/2013 1217   ALT 144 (H) 04/07/2020 1109   ALT 40 03/24/2020 0853   ALT 23 04/27/2013 1217   BILITOT 0.5 04/07/2020 1109   BILITOT 0.5 03/24/2020 0853   BILITOT 0.86 04/27/2013 1217

## 2020-04-07 NOTE — Progress Notes (Signed)
Per [name], okay to continue with treatment with labs from today.

## 2020-04-07 NOTE — Assessment & Plan Note (Signed)
This is due to recent treatment As above, we will proceed with dose adjustment

## 2020-04-07 NOTE — Assessment & Plan Note (Signed)
Overall, he has excellent response to therapy The lymphadenopathy has resolved However, his treatment is complicated slightly by neutropenia, elevated liver enzymes, nausea, constipation as well as mild weight gain  I plan to reduce the dose of chemotherapy by 20% We will proceed without delay

## 2020-04-07 NOTE — Patient Instructions (Addendum)
[facility] Discharge Instructions for Patients Receiving Chemotherapy  Today you received the following chemotherapy agents: Adriamycin/Vinblastine/Bleomycin/Dacarbazine.  To help prevent nausea and vomiting after your treatment, we encourage you to take your nausea medication as directed.   If you develop nausea and vomiting that is not controlled by your nausea medication, call the clinic.   BELOW ARE SYMPTOMS THAT SHOULD BE REPORTED IMMEDIATELY:  *FEVER GREATER THAN 100.5 F  *CHILLS WITH OR WITHOUT FEVER  NAUSEA AND VOMITING THAT IS NOT CONTROLLED WITH YOUR NAUSEA MEDICATION  *UNUSUAL SHORTNESS OF BREATH  *UNUSUAL BRUISING OR BLEEDING  TENDERNESS IN MOUTH AND THROAT WITH OR WITHOUT PRESENCE OF ULCERS  *URINARY PROBLEMS  *BOWEL PROBLEMS  UNUSUAL RASH Items with * indicate a potential emergency and should be followed up as soon as possible.  Feel free to call the clinic should you have any questions or concerns. The clinic phone number is [phone].  Please show the [place] at check-in to the Emergency Department and triage nurse.  Ferumoxytol injection What is this medicine? FERUMOXYTOL is an iron complex. Iron is used to make healthy red blood cells, which carry oxygen and nutrients throughout the body. This medicine is used to treat iron deficiency anemia. This medicine may be used for other purposes; ask your health care provider or pharmacist if you have questions. COMMON BRAND NAME(S): Feraheme What should I tell my health care provider before I take this medicine? They need to know if you have any of these conditions:  anemia not caused by low iron levels  high levels of iron in the blood  magnetic resonance imaging (MRI) test scheduled  an unusual or allergic reaction to iron, other medicines, foods, dyes, or preservatives  pregnant or trying to get pregnant  breast-feeding How should I use this medicine? This medicine is for  injection into a vein. It is given by a health care professional in a hospital or clinic setting. Talk to your pediatrician regarding the use of this medicine in children. Special care may be needed. Overdosage: If you think you have taken too much of this medicine contact a poison control center or emergency room at once. NOTE: This medicine is only for you. Do not share this medicine with others. What if I miss a dose? It is important not to miss your dose. Call your doctor or health care professional if you are unable to keep an appointment. What may interact with this medicine? This medicine may interact with the following medications:  other iron products This list may not describe all possible interactions. Give your health care provider a list of all the medicines, herbs, non-prescription drugs, or dietary supplements you use. Also tell them if you smoke, drink alcohol, or use illegal drugs. Some items may interact with your medicine. What should I watch for while using this medicine? Visit your doctor or healthcare professional regularly. Tell your doctor or healthcare professional if your symptoms do not start to get better or if they get worse. You may need blood work done while you are taking this medicine. You may need to follow a special diet. Talk to your doctor. Foods that contain iron include: whole grains/cereals, dried fruits, beans, or peas, leafy green vegetables, and organ meats (liver, kidney). What side effects may I notice from receiving this medicine? Side effects that you should report to your doctor or health care professional as soon as possible:  allergic reactions like skin rash, itching or hives, swelling of  the face, lips, or tongue  breathing problems  changes in blood pressure  feeling faint or lightheaded, falls  fever or chills  flushing, sweating, or hot feelings  swelling of the ankles or feet Side effects that usually do not require medical  attention (report to your doctor or health care professional if they continue or are bothersome):  diarrhea  headache  nausea, vomiting  stomach pain This list may not describe all possible side effects. Call your doctor for medical advice about side effects. You may report side effects to FDA at 1-800-FDA-1088. Where should I keep my medicine? This drug is given in a hospital or clinic and will not be stored at home. NOTE: This sheet is a summary. It may not cover all possible information. If you have questions about this medicine, talk to your doctor, pharmacist, or health care provider.  2020 Elsevier/Gold Standard (2016-08-02 20:21:10)

## 2020-04-07 NOTE — Assessment & Plan Note (Signed)
He has antiemetics to take as needed

## 2020-04-07 NOTE — Assessment & Plan Note (Signed)
He had minor flare of Crohn's disease I informed the patient of my discussion with gastroenterologist The plan would be start him on prednisone if his Crohn's disease flares up

## 2020-04-07 NOTE — Patient Instructions (Signed)

## 2020-04-07 NOTE — Assessment & Plan Note (Signed)
This is due to treatment As above, plan reduced dose chemo

## 2020-04-18 MED FILL — Dexamethasone Sodium Phosphate Inj 100 MG/10ML: INTRAMUSCULAR | Qty: 1 | Status: AC

## 2020-04-21 ENCOUNTER — Inpatient Hospital Stay (HOSPITAL_BASED_OUTPATIENT_CLINIC_OR_DEPARTMENT_OTHER): Payer: 59 | Admitting: Hematology and Oncology

## 2020-04-21 ENCOUNTER — Inpatient Hospital Stay: Payer: 59

## 2020-04-21 ENCOUNTER — Encounter: Payer: Self-pay | Admitting: Hematology and Oncology

## 2020-04-21 ENCOUNTER — Other Ambulatory Visit: Payer: Self-pay

## 2020-04-21 ENCOUNTER — Other Ambulatory Visit: Payer: Self-pay | Admitting: Hematology and Oncology

## 2020-04-21 DIAGNOSIS — C8111 Nodular sclerosis classical Hodgkin lymphoma, lymph nodes of head, face, and neck: Secondary | ICD-10-CM

## 2020-04-21 DIAGNOSIS — Z5111 Encounter for antineoplastic chemotherapy: Secondary | ICD-10-CM | POA: Diagnosis not present

## 2020-04-21 DIAGNOSIS — K50919 Crohn's disease, unspecified, with unspecified complications: Secondary | ICD-10-CM | POA: Diagnosis not present

## 2020-04-21 DIAGNOSIS — D509 Iron deficiency anemia, unspecified: Secondary | ICD-10-CM

## 2020-04-21 DIAGNOSIS — R748 Abnormal levels of other serum enzymes: Secondary | ICD-10-CM

## 2020-04-21 DIAGNOSIS — R110 Nausea: Secondary | ICD-10-CM | POA: Diagnosis not present

## 2020-04-21 LAB — CMP (CANCER CENTER ONLY)
ALT: 122 U/L — ABNORMAL HIGH (ref 0–44)
AST: 48 U/L — ABNORMAL HIGH (ref 15–41)
Albumin: 3.7 g/dL (ref 3.5–5.0)
Alkaline Phosphatase: 49 U/L (ref 38–126)
Anion gap: 4 — ABNORMAL LOW (ref 5–15)
BUN: 16 mg/dL (ref 6–20)
CO2: 27 mmol/L (ref 22–32)
Calcium: 9.4 mg/dL (ref 8.9–10.3)
Chloride: 106 mmol/L (ref 98–111)
Creatinine: 0.78 mg/dL (ref 0.61–1.24)
GFR, Estimated: >60 mL/min (ref >60)
Glucose, Bld: 89 mg/dL (ref 70–99)
Potassium: 4 mmol/L (ref 3.5–5.1)
Sodium: 137 mmol/L (ref 135–145)
Total Bilirubin: 0.4 mg/dL (ref 0.3–1.2)
Total Protein: 6.9 g/dL (ref 6.5–8.1)

## 2020-04-21 LAB — CBC WITH DIFFERENTIAL (CANCER CENTER ONLY)
Abs Immature Granulocytes: 0.03 10*3/uL (ref 0.00–0.07)
Basophils Absolute: 0.1 10*3/uL (ref 0.0–0.1)
Basophils Relative: 1 %
Eosinophils Absolute: 0.2 10*3/uL (ref 0.0–0.5)
Eosinophils Relative: 3 %
HCT: 36.7 % — ABNORMAL LOW (ref 39.0–52.0)
Hemoglobin: 11.9 g/dL — ABNORMAL LOW (ref 13.0–17.0)
Immature Granulocytes: 1 %
Lymphocytes Relative: 24 %
Lymphs Abs: 1.6 10*3/uL (ref 0.7–4.0)
MCH: 27 pg (ref 26.0–34.0)
MCHC: 32.4 g/dL (ref 30.0–36.0)
MCV: 83.4 fL (ref 80.0–100.0)
Monocytes Absolute: 1.4 10*3/uL — ABNORMAL HIGH (ref 0.1–1.0)
Monocytes Relative: 21 %
Neutro Abs: 3.3 10*3/uL (ref 1.7–7.7)
Neutrophils Relative %: 50 %
Platelet Count: 274 10*3/uL (ref 150–400)
RBC: 4.4 MIL/uL (ref 4.22–5.81)
RDW: 21.2 % — ABNORMAL HIGH (ref 11.5–15.5)
WBC Count: 6.6 10*3/uL (ref 4.0–10.5)
nRBC: 0 % (ref 0.0–0.2)

## 2020-04-21 MED ORDER — SODIUM CHLORIDE 0.9 % IV SOLN: 5.0000 mg | Freq: Once | INTRAVENOUS | Status: DC

## 2020-04-21 MED ORDER — SODIUM CHLORIDE 0.9 % IV SOLN
300.0000 mg/m2 | Freq: Once | INTRAVENOUS | Status: AC
  Administered 2020-04-21: 690 mg via INTRAVENOUS
  Filled 2020-04-21: qty 69

## 2020-04-21 MED ORDER — DOXORUBICIN HCL CHEMO IV INJECTION 2 MG/ML
20.0000 mg/m2 | Freq: Once | INTRAVENOUS | Status: AC
  Administered 2020-04-21: 46 mg via INTRAVENOUS
  Filled 2020-04-21: qty 23

## 2020-04-21 MED ORDER — HEPARIN SOD (PORK) LOCK FLUSH 100 UNIT/ML IV SOLN
500.0000 [IU] | Freq: Once | INTRAVENOUS | Status: AC | PRN
  Administered 2020-04-21: 500 [IU]
  Filled 2020-04-21: qty 5

## 2020-04-21 MED ORDER — SODIUM CHLORIDE 0.9 % IV SOLN
8.0000 [IU]/m2 | Freq: Once | INTRAVENOUS | Status: AC
  Administered 2020-04-21: 18 [IU] via INTRAVENOUS
  Filled 2020-04-21: qty 6

## 2020-04-21 MED ORDER — VINBLASTINE SULFATE CHEMO INJECTION 1 MG/ML
4.8000 mg/m2 | Freq: Once | INTRAVENOUS | Status: AC
  Administered 2020-04-21: 11 mg via INTRAVENOUS
  Filled 2020-04-21: qty 11

## 2020-04-21 MED ORDER — DEXAMETHASONE SODIUM PHOSPHATE 10 MG/ML IJ SOLN
5.0000 mg | Freq: Once | INTRAMUSCULAR | Status: AC
  Administered 2020-04-21: 5 mg via INTRAVENOUS

## 2020-04-21 MED ORDER — SODIUM CHLORIDE 0.9% FLUSH
10.0000 mL | INTRAVENOUS | Status: DC | PRN
  Administered 2020-04-21: 10 mL
  Filled 2020-04-21: qty 10

## 2020-04-21 MED ORDER — DEXAMETHASONE SODIUM PHOSPHATE 10 MG/ML IJ SOLN
INTRAMUSCULAR | Status: AC
  Filled 2020-04-21: qty 1

## 2020-04-21 MED ORDER — PALONOSETRON HCL INJECTION 0.25 MG/5ML
INTRAVENOUS | Status: AC
  Filled 2020-04-21: qty 5

## 2020-04-21 MED ORDER — SODIUM CHLORIDE 0.9 % IV SOLN
Freq: Once | INTRAVENOUS | Status: AC
  Filled 2020-04-21: qty 250

## 2020-04-21 MED ORDER — SODIUM CHLORIDE 0.9 % IV SOLN
150.0000 mg | Freq: Once | INTRAVENOUS | Status: AC
  Administered 2020-04-21: 150 mg via INTRAVENOUS
  Filled 2020-04-21: qty 150

## 2020-04-21 MED ORDER — PALONOSETRON HCL INJECTION 0.25 MG/5ML
0.2500 mg | Freq: Once | INTRAVENOUS | Status: AC
  Administered 2020-04-21: 0.25 mg via INTRAVENOUS

## 2020-04-21 MED ORDER — SODIUM CHLORIDE 0.9% FLUSH
10.0000 mL | Freq: Once | INTRAVENOUS | Status: AC
  Administered 2020-04-21: 10 mL
  Filled 2020-04-21: qty 10

## 2020-04-21 NOTE — Progress Notes (Signed)
[place] OFFICE PROGRESS NOTE  Patient Care Team: [name], MD as PCP - General (Family Medicine) [name], MD as Attending Physician (Family Medicine) [name], MD (Inactive) (Gastroenterology)  ASSESSMENT & PLAN:  Hodgkin lymphoma [facility]) His examination today is benign He has achieved complete response to therapy He has gained a lot of weight due to craving for sweets I plan to reduce the dose of steroids a little bit I plan to order a PET CT scan after cycle 2, day 15 for objective assessment of response to therapy  Elevated liver enzymes This is due to treatment It is slightly improved compared to prior visit We will continue treatment as scheduled  Nausea without vomiting He has antiemetics to take as needed  Crohn disease ([place]) He has no recent flare of Crohn's disease The plan would be start him on prednisone if his Crohn's disease flares up   No orders of the defined types were placed in this encounter.   All questions were answered. The patient knows to call the clinic with any problems, questions or concerns. The total time spent in the appointment was 20 minutes encounter with patients including review of chart and various tests results, discussions about plan of care and coordination of care plan   [name], MD 04/21/2020 11:53 AM  INTERVAL HISTORY: Please see below for problem oriented charting. He is seen for further follow-up He tolerated recent chemo well except for some mild nausea but no vomiting He has gained a lot of weight due to sugar craving from treatment No recent flares of Crohn's disease He has occasional numbness and tingling of his hands and feet but they are temporary and not permanent  SUMMARY OF ONCOLOGIC HISTORY: Oncology History  Hodgkin lymphoma ([place])  03/10/2020 Pathology Results   A. LYMPH NODE, RIGHT SUBMANDIBULAR, BIOPSY:  -  Classic Hodgkin lymphoma  -  See comment   COMMENT:   The  submitted lymph nodes are effaced by a vaguely nodular lymphoid proliferation separated by bands of fibrosis with admixed inflammatory cells including neutrophils and eosinophils. The lymphoid proliferation  is composed predominantly of small mature lymphocytes with scattered large atypical cells. The atypical cells have mono and multi-lobated nuclei and prominent nucleoli characteristic of Reed-Sternberg cells.  Lacunar cells are also present.   The neoplastic cells are CD30, CD15 (subset), Pax-5 (dim) and CD20 positive by immunohistochemistry. EBV by in-situ hybridization is positive in the Reed-Sternberg cells. CD3 highlights the background  T-cells.  EBV in-situ hybridization is negative.   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    03/14/2020 Initial Diagnosis   Hodgkin lymphoma ([place])   03/14/2020 Cancer Staging   Staging form: Hodgkin and Non-Hodgkin Lymphoma, AJCC 8th Edition - Clinical stage from 03/14/2020: Stage I (Hodgkin lymphoma, B - Symptoms) - Signed by [name], MD on 03/14/2020   03/18/2020 Echocardiogram   1. Left ventricular ejection fraction, by estimation, is 55 to 60%. The left ventricle has normal function. The left ventricle has no regional wall motion abnormalities. Left ventricular diastolic parameters were normal. The average left ventricular global longitudinal strain is -15.9 %. The global longitudinal strain is normal.  2. Right ventricular systolic function is normal. The right ventricular size is normal. There is normal pulmonary artery systolic pressure.  3. The mitral valve is normal in structure. No evidence of mitral valve regurgitation. No evidence of mitral stenosis.  4. The aortic  valve is normal in structure. Aortic valve regurgitation is not visualized. No aortic stenosis is present.  5. The inferior vena cava is normal in size with greater than 50%  respiratory variability, suggesting right atrial pressure of 3 mmHg.   03/20/2020 Procedure   Placement of a subcutaneous port device. Catheter tip at the superior cavoatrial junction.     REVIEW OF SYSTEMS:   Constitutional: Denies fevers, chills or abnormal weight loss Eyes: Denies blurriness of vision Ears, nose, mouth, throat, and face: Denies mucositis or sore throat Respiratory: Denies cough, dyspnea or wheezes Cardiovascular: Denies palpitation, chest discomfort or lower extremity swelling Skin: Denies abnormal skin rashes Lymphatics: Denies new lymphadenopathy or easy bruising Behavioral/Psych: Mood is stable, no new changes  All other systems were reviewed with the patient and are negative.  I have reviewed the past medical history, past surgical history, social history and family history with the patient and they are unchanged from previous note.  ALLERGIES:  is allergic to amoxicillin.  MEDICATIONS:  Current Outpatient Medications  Medication Sig Dispense Refill  . acetaminophen (TYLENOL) 500 MG tablet Take 1,000 mg by mouth every 6 (six) hours as needed for moderate pain or headache.    . Carboxymethylcellul-Glycerin (LUBRICATING EYE DROPS OP) Place 1 drop into both eyes daily as needed (dry eyes).    . Cholecalciferol (VITAMIN D) 50 MCG (2000 UT) tablet Take 6,000 Units by mouth daily.    Marland Kitchen EPINEPHrine 0.3 mg/0.3 mL IJ SOAJ injection Inject 0.3 mg into the muscle as needed for anaphylaxis.    . ferrous sulfate 325 (65 FE) MG tablet Take 325 mg by mouth daily.    Marland Kitchen lidocaine-prilocaine (EMLA) cream Apply to affected area once (Patient taking differently: Apply 1 application topically daily as needed (port access). ) 30 g 3  . Magnesium 250 MG TABS Take 250 mg by mouth daily.    . Multiple Vitamins-Minerals (ZINC PO) Take 2 tablets by mouth daily. With quercetin    . ondansetron (ZOFRAN) 8 MG tablet Take 1 tablet (8 mg total) by mouth every 8 (eight) hours as needed. Start  on the third day after chemotherapy. (Patient taking differently: Take 8 mg by mouth every 8 (eight) hours as needed for nausea or vomiting. Start on the third day after chemotherapy.) 30 tablet 1  . OVER THE COUNTER MEDICATION Take 2 capsules by mouth at bedtime as needed (sleep). Stress Relief otc supplement    . oxymetazoline (AFRIN) 0.05 % nasal spray Place 1 spray into both nostrils 2 (two) times daily as needed for congestion.    . polyethylene glycol (MIRALAX / GLYCOLAX) 17 g packet Take 17 g by mouth daily.    . prochlorperazine (COMPAZINE) 10 MG tablet Take 1 tablet (10 mg total) by mouth every 6 (six) hours as needed (Nausea or vomiting). 30 tablet 1  . SUPER B COMPLEX/C PO Take 1 tablet by mouth daily.    . TURMERIC PO Take 2 capsules by mouth daily.     No current facility-administered medications for this visit.   Facility-Administered Medications Ordered in Other Visits  Medication Dose Route Frequency Provider Last Rate Last Admin  . bleomycin (BLEOCIN) 18 Units in sodium chloride 0.9 % 50 mL chemo infusion  8 Units/m2 (Treatment Plan Recorded) Intravenous Once Danis Pembleton, MD      . dacarbazine (DTIC) 690 mg in sodium chloride 0.9 % 250 mL chemo infusion  300 mg/m2 (Treatment Plan Recorded) Intravenous Once [name], MD      .  dexamethasone (DECADRON) injection 5 mg  5 mg Intravenous Once Alvy Bimler, Lochlann Mastrangelo, MD      . DOXOrubicin (ADRIAMYCIN) chemo injection 46 mg  20 mg/m2 (Treatment Plan Recorded) Intravenous Once Alvy Bimler, Nyelli Samara, MD      . fosaprepitant (EMEND) 150 mg in sodium chloride 0.9 % 145 mL IVPB  150 mg Intravenous Once Alvy Bimler, Ryhanna Dunsmore, MD      . heparin lock flush 100 unit/mL  500 Units Intracatheter Once PRN Alvy Bimler, Katyana Trolinger, MD      . palonosetron (ALOXI) injection 0.25 mg  0.25 mg Intravenous Once Mallorie Norrod, MD      . sodium chloride flush (NS) 0.9 % injection 10 mL  10 mL Intracatheter PRN Obbie Lewallen, MD      . vinBLAStine (VELBAN) 11 mg in sodium chloride 0.9 % 50 mL chemo  infusion  4.8 mg/m2 (Treatment Plan Recorded) Intravenous Once Alvy Bimler, Nasser Ku, MD        PHYSICAL EXAMINATION: ECOG PERFORMANCE STATUS: 1 - Symptomatic but completely ambulatory  Vitals:   04/21/20 1057  BP: 132/79  Pulse: 79  Resp: 20  Temp: 98.3 F (36.8 C)  SpO2: 100%   Filed Weights   04/21/20 1057  Weight: 230 lb 3.2 oz (104.4 kg)    GENERAL:alert, no distress and comfortable SKIN: skin color, texture, turgor are normal, no rashes or significant lesions EYES: normal, Conjunctiva are pink and non-injected, sclera clear OROPHARYNX:no exudate, no erythema and lips, buccal mucosa, and tongue normal  NECK: supple, thyroid normal size, non-tender, without nodularity LYMPH:  no palpable lymphadenopathy in the cervical, axillary or inguinal LUNGS: clear to auscultation and percussion with normal breathing effort HEART: regular rate & rhythm and no murmurs and no lower extremity edema ABDOMEN:abdomen soft, non-tender and normal bowel sounds Musculoskeletal:no cyanosis of digits and no clubbing  NEURO: alert & oriented x 3 with fluent speech, no focal motor/sensory deficits  LABORATORY DATA:  I have reviewed the data as listed    Component Value Date/Time   NA 137 04/21/2020 1041   NA 141 04/27/2013 1217   K 4.0 04/21/2020 1041   K 4.0 04/27/2013 1217   CL 106 04/21/2020 1041   CL 106 04/26/2012 1550   CO2 27 04/21/2020 1041   CO2 28 04/27/2013 1217   GLUCOSE 89 04/21/2020 1041   GLUCOSE 92 04/27/2013 1217   GLUCOSE 108 (H) 04/26/2012 1550   BUN 16 04/21/2020 1041   BUN 10.2 04/27/2013 1217   CREATININE 0.78 04/21/2020 1041   CREATININE 0.8 04/27/2013 1217   CALCIUM 9.4 04/21/2020 1041   CALCIUM 9.7 04/27/2013 1217   PROT 6.9 04/21/2020 1041   PROT 7.5 04/27/2013 1217   ALBUMIN 3.7 04/21/2020 1041   ALBUMIN 3.6 04/27/2013 1217   AST 48 (H) 04/21/2020 1041   AST 14 04/27/2013 1217   ALT 122 (H) 04/21/2020 1041   ALT 23 04/27/2013 1217   ALKPHOS 49 04/21/2020 1041    ALKPHOS 64 04/27/2013 1217   BILITOT 0.4 04/21/2020 1041   BILITOT 0.86 04/27/2013 1217   GFRNONAA >60 04/21/2020 1041   GFRAA >60 03/24/2020 0853    No results found for: SPEP, UPEP  Lab Results  Component Value Date   WBC 6.6 04/21/2020   NEUTROABS 3.3 04/21/2020   HGB 11.9 (L) 04/21/2020   HCT 36.7 (L) 04/21/2020   MCV 83.4 04/21/2020   PLT 274 04/21/2020      Chemistry      Component Value Date/Time   NA 137 04/21/2020 1041  NA 141 04/27/2013 1217   K 4.0 04/21/2020 1041   K 4.0 04/27/2013 1217   CL 106 04/21/2020 1041   CL 106 04/26/2012 1550   CO2 27 04/21/2020 1041   CO2 28 04/27/2013 1217   BUN 16 04/21/2020 1041   BUN 10.2 04/27/2013 1217   CREATININE 0.78 04/21/2020 1041   CREATININE 0.8 04/27/2013 1217      Component Value Date/Time   CALCIUM 9.4 04/21/2020 1041   CALCIUM 9.7 04/27/2013 1217   ALKPHOS 49 04/21/2020 1041   ALKPHOS 64 04/27/2013 1217   AST 48 (H) 04/21/2020 1041   AST 14 04/27/2013 1217   ALT 122 (H) 04/21/2020 1041   ALT 23 04/27/2013 1217   BILITOT 0.4 04/21/2020 1041   BILITOT 0.86 04/27/2013 1217

## 2020-04-21 NOTE — Assessment & Plan Note (Signed)
His examination today is benign He has achieved complete response to therapy He has gained a lot of weight due to craving for sweets I plan to reduce the dose of steroids a little bit I plan to order a PET CT scan after cycle 2, day 15 for objective assessment of response to therapy

## 2020-04-21 NOTE — Patient Instructions (Addendum)
[facility] Discharge Instructions for Patients Receiving Chemotherapy  Today you received the following chemotherapy agents: Adriamycin/Vinblastine/Bleomycin/Dacarbazine.  To help prevent nausea and vomiting after your treatment, we encourage you to take your nausea medication as directed.   If you develop nausea and vomiting that is not controlled by your nausea medication, call the clinic.   BELOW ARE SYMPTOMS THAT SHOULD BE REPORTED IMMEDIATELY:  *FEVER GREATER THAN 100.5 F  *CHILLS WITH OR WITHOUT FEVER  NAUSEA AND VOMITING THAT IS NOT CONTROLLED WITH YOUR NAUSEA MEDICATION  *UNUSUAL SHORTNESS OF BREATH  *UNUSUAL BRUISING OR BLEEDING  TENDERNESS IN MOUTH AND THROAT WITH OR WITHOUT PRESENCE OF ULCERS  *URINARY PROBLEMS  *BOWEL PROBLEMS  UNUSUAL RASH Items with * indicate a potential emergency and should be followed up as soon as possible.  Feel free to call the clinic should you have any questions or concerns. The clinic phone number is [phone].  Please show the [place] at check-in to the Emergency Department and triage nurse.

## 2020-04-21 NOTE — Assessment & Plan Note (Signed)
This is due to treatment It is slightly improved compared to prior visit We will continue treatment as scheduled

## 2020-04-21 NOTE — Progress Notes (Signed)
Per [name], ok to treat with ALT 122.

## 2020-04-21 NOTE — Assessment & Plan Note (Signed)
He has antiemetics to take as needed

## 2020-04-21 NOTE — Assessment & Plan Note (Signed)
He has no recent flare of Crohn's disease The plan would be start him on prednisone if his Crohn's disease flares up

## 2020-04-23 ENCOUNTER — Telehealth: Payer: Self-pay

## 2020-04-23 NOTE — Telephone Encounter (Signed)
Called back and given below message. He verbalized understanding. He has tried melatonin and it has not helped with sleep. He is going to take the Seroquel Rx that he has at home.

## 2020-04-23 NOTE — Telephone Encounter (Signed)
Seroquel can be sedation but not my first choice for sleep If he wants to take it, ok from my perspective Otherwise I would try OTC bendary 50 to 100 mg and melatonin and if not working I can prescribe something else

## 2020-04-23 NOTE — Telephone Encounter (Signed)
He called and left a message. He forgot to mentioned at last visit. He has been having trouble sleeping for the last 2 weeks. He even had trouble prior to starting treatment. He has a left over Seroquel 25 mg Rx from his crohn's treatment. He is asking if it is okay to take the Seroquel on the night he has trouble sleeping?

## 2020-05-05 ENCOUNTER — Inpatient Hospital Stay: Payer: 59 | Attending: Hematology and Oncology

## 2020-05-05 ENCOUNTER — Other Ambulatory Visit: Payer: Self-pay

## 2020-05-05 ENCOUNTER — Encounter: Payer: Self-pay | Admitting: Hematology and Oncology

## 2020-05-05 ENCOUNTER — Inpatient Hospital Stay: Payer: 59

## 2020-05-05 ENCOUNTER — Inpatient Hospital Stay (HOSPITAL_BASED_OUTPATIENT_CLINIC_OR_DEPARTMENT_OTHER): Payer: 59 | Admitting: Hematology and Oncology

## 2020-05-05 DIAGNOSIS — C8190 Hodgkin lymphoma, unspecified, unspecified site: Secondary | ICD-10-CM | POA: Insufficient documentation

## 2020-05-05 DIAGNOSIS — C8111 Nodular sclerosis classical Hodgkin lymphoma, lymph nodes of head, face, and neck: Secondary | ICD-10-CM

## 2020-05-05 DIAGNOSIS — Z5111 Encounter for antineoplastic chemotherapy: Secondary | ICD-10-CM | POA: Insufficient documentation

## 2020-05-05 DIAGNOSIS — R5381 Other malaise: Secondary | ICD-10-CM

## 2020-05-05 DIAGNOSIS — G620 Drug-induced polyneuropathy: Secondary | ICD-10-CM | POA: Diagnosis not present

## 2020-05-05 DIAGNOSIS — D509 Iron deficiency anemia, unspecified: Secondary | ICD-10-CM

## 2020-05-05 DIAGNOSIS — K50919 Crohn's disease, unspecified, with unspecified complications: Secondary | ICD-10-CM

## 2020-05-05 DIAGNOSIS — T451X5A Adverse effect of antineoplastic and immunosuppressive drugs, initial encounter: Secondary | ICD-10-CM

## 2020-05-05 DIAGNOSIS — L708 Other acne: Secondary | ICD-10-CM

## 2020-05-05 DIAGNOSIS — R748 Abnormal levels of other serum enzymes: Secondary | ICD-10-CM

## 2020-05-05 LAB — CMP (CANCER CENTER ONLY)
ALT: 71 U/L — ABNORMAL HIGH (ref 0–44)
AST: 38 U/L (ref 15–41)
Albumin: 3.9 g/dL (ref 3.5–5.0)
Alkaline Phosphatase: 47 U/L (ref 38–126)
Anion gap: 7 (ref 5–15)
BUN: 25 mg/dL — ABNORMAL HIGH (ref 6–20)
CO2: 24 mmol/L (ref 22–32)
Calcium: 9.4 mg/dL (ref 8.9–10.3)
Chloride: 106 mmol/L (ref 98–111)
Creatinine: 0.8 mg/dL (ref 0.61–1.24)
GFR, Estimated: >60 mL/min (ref >60)
Glucose, Bld: 100 mg/dL — ABNORMAL HIGH (ref 70–99)
Potassium: 4.3 mmol/L (ref 3.5–5.1)
Sodium: 137 mmol/L (ref 135–145)
Total Bilirubin: 0.4 mg/dL (ref 0.3–1.2)
Total Protein: 7.4 g/dL (ref 6.5–8.1)

## 2020-05-05 LAB — CBC WITH DIFFERENTIAL (CANCER CENTER ONLY)
Abs Immature Granulocytes: 0.03 10*3/uL (ref 0.00–0.07)
Basophils Absolute: 0.1 10*3/uL (ref 0.0–0.1)
Basophils Relative: 1 %
Eosinophils Absolute: 0.3 10*3/uL (ref 0.0–0.5)
Eosinophils Relative: 4 %
HCT: 38.8 % — ABNORMAL LOW (ref 39.0–52.0)
Hemoglobin: 13 g/dL (ref 13.0–17.0)
Immature Granulocytes: 0 %
Lymphocytes Relative: 22 %
Lymphs Abs: 1.9 10*3/uL (ref 0.7–4.0)
MCH: 27.8 pg (ref 26.0–34.0)
MCHC: 33.5 g/dL (ref 30.0–36.0)
MCV: 83.1 fL (ref 80.0–100.0)
Monocytes Absolute: 1.7 10*3/uL — ABNORMAL HIGH (ref 0.1–1.0)
Monocytes Relative: 20 %
Neutro Abs: 4.5 10*3/uL (ref 1.7–7.7)
Neutrophils Relative %: 53 %
Platelet Count: 319 10*3/uL (ref 150–400)
RBC: 4.67 MIL/uL (ref 4.22–5.81)
RDW: 21.4 % — ABNORMAL HIGH (ref 11.5–15.5)
WBC Count: 8.6 10*3/uL (ref 4.0–10.5)
nRBC: 0 % (ref 0.0–0.2)

## 2020-05-05 MED ORDER — PALONOSETRON HCL INJECTION 0.25 MG/5ML
INTRAVENOUS | Status: AC
  Filled 2020-05-05: qty 5

## 2020-05-05 MED ORDER — HEPARIN SOD (PORK) LOCK FLUSH 100 UNIT/ML IV SOLN
500.0000 [IU] | Freq: Once | INTRAVENOUS | Status: AC | PRN
  Administered 2020-05-05: 500 [IU]
  Filled 2020-05-05: qty 5

## 2020-05-05 MED ORDER — PALONOSETRON HCL INJECTION 0.25 MG/5ML
0.2500 mg | Freq: Once | INTRAVENOUS | Status: AC
  Administered 2020-05-05: 0.25 mg via INTRAVENOUS

## 2020-05-05 MED ORDER — DEXAMETHASONE SODIUM PHOSPHATE 10 MG/ML IJ SOLN
5.0000 mg | Freq: Once | INTRAMUSCULAR | Status: AC
  Administered 2020-05-05: 5 mg via INTRAVENOUS

## 2020-05-05 MED ORDER — DEXAMETHASONE SODIUM PHOSPHATE 10 MG/ML IJ SOLN
INTRAMUSCULAR | Status: AC
  Filled 2020-05-05: qty 1

## 2020-05-05 MED ORDER — SODIUM CHLORIDE 0.9 % IV SOLN
8.0000 [IU]/m2 | Freq: Once | INTRAVENOUS | Status: AC
  Administered 2020-05-05: 18 [IU] via INTRAVENOUS
  Filled 2020-05-05: qty 6

## 2020-05-05 MED ORDER — DOXORUBICIN HCL CHEMO IV INJECTION 2 MG/ML
20.0000 mg/m2 | Freq: Once | INTRAVENOUS | Status: AC
  Administered 2020-05-05: 46 mg via INTRAVENOUS
  Filled 2020-05-05: qty 23

## 2020-05-05 MED ORDER — SODIUM CHLORIDE 0.9% FLUSH
10.0000 mL | INTRAVENOUS | Status: DC | PRN
  Administered 2020-05-05: 10 mL
  Filled 2020-05-05: qty 10

## 2020-05-05 MED ORDER — SODIUM CHLORIDE 0.9 % IV SOLN: 5.0000 mg | Freq: Once | INTRAVENOUS | Status: DC

## 2020-05-05 MED ORDER — SODIUM CHLORIDE 0.9 % IV SOLN
Freq: Once | INTRAVENOUS | Status: AC
  Filled 2020-05-05: qty 250

## 2020-05-05 MED ORDER — SODIUM CHLORIDE 0.9% FLUSH
10.0000 mL | Freq: Once | INTRAVENOUS | Status: AC
  Administered 2020-05-05: 10 mL
  Filled 2020-05-05: qty 10

## 2020-05-05 MED ORDER — SODIUM CHLORIDE 0.9 % IV SOLN
300.0000 mg/m2 | Freq: Once | INTRAVENOUS | Status: AC
  Administered 2020-05-05: 690 mg via INTRAVENOUS
  Filled 2020-05-05: qty 69

## 2020-05-05 MED ORDER — VINBLASTINE SULFATE CHEMO INJECTION 1 MG/ML
3.0000 mg/m2 | Freq: Once | INTRAVENOUS | Status: AC
  Administered 2020-05-05: 6.9 mg via INTRAVENOUS
  Filled 2020-05-05: qty 6.9

## 2020-05-05 MED ORDER — SODIUM CHLORIDE 0.9 % IV SOLN
150.0000 mg | Freq: Once | INTRAVENOUS | Status: AC
  Administered 2020-05-05: 150 mg via INTRAVENOUS
  Filled 2020-05-05: qty 150

## 2020-05-05 NOTE — Assessment & Plan Note (Signed)
He requires occasional assistance from his spouse for activities of daily living I spent some time completing the paperwork for FMLA today

## 2020-05-05 NOTE — Assessment & Plan Note (Signed)
he has mild peripheral neuropathy, likely related to side effects of treatment. I plan to reduce the dose of treatment as outlined above.  I explained to the patient the rationale of this strategy and reassured the patient it would not compromise the efficacy of treatment

## 2020-05-05 NOTE — Progress Notes (Signed)
[facility] OFFICE PROGRESS NOTE  Patient Care Team: [name], MD as PCP - General (Family Medicine) [name], MD as Attending Physician (Family Medicine) [name], MD (Inactive) (Gastroenterology)  ASSESSMENT & PLAN:  Hodgkin lymphoma [facility]) He has achieved complete response to therapy on recent exam I plan to continue on mildly reduced dose of steroids due to insomnia I plan to reduce the dose of vinblastine due to slight worsening neuropathy He is scheduled for PET CT scan before I see him back for objective assessment of response to therapy  Peripheral neuropathy due to chemotherapy [facility]) he has mild peripheral neuropathy, likely related to side effects of treatment. I plan to reduce the dose of treatment as outlined above.  I explained to the patient the rationale of this strategy and reassured the patient it would not compromise the efficacy of treatment   Elevated liver enzymes This is much improved since our last dose adjustment Continued close observation  Crohn disease ([place]) He has slight worse abdominal bloating and intermittent abdominal pain I recommend discontinuation of oral iron supplement  Acne-like skin bumps Likely due to recent treatment Observe for now  Physical debility He requires occasional assistance from his spouse for activities of daily living I spent some time completing the paperwork for FMLA today   No orders of the defined types were placed in this encounter.   All questions were answered. The patient knows to call the clinic with any problems, questions or concerns. The total time spent in the appointment was 30 minutes encounter with patients including review of chart and various tests results, discussions about plan of care and coordination of care plan   Mark Lark, MD 05/05/2020 12:01 PM  INTERVAL HISTORY: Please see below for problem oriented charting. He is seen prior to cycle 2-day 15 of  treatment He noticed slight worsening peripheral neuropathy affecting his tips of fingers and toes He has occasional bloating sensation and intermittent abdominal pain with occasional rare passage of blood in his stool but not significant No nausea He complained of insomnia He noticed some more skin breakout on his face  SUMMARY OF ONCOLOGIC HISTORY: Oncology History  Hodgkin lymphoma ([place])  03/10/2020 Pathology Results   A. LYMPH NODE, RIGHT SUBMANDIBULAR, BIOPSY:  -  Classic Hodgkin lymphoma  -  See comment   COMMENT:   The submitted lymph nodes are effaced by a vaguely nodular lymphoid proliferation separated by bands of fibrosis with admixed inflammatory cells including neutrophils and eosinophils. The lymphoid proliferation  is composed predominantly of small mature lymphocytes with scattered large atypical cells. The atypical cells have mono and multi-lobated nuclei and prominent nucleoli characteristic of Reed-Sternberg cells.  Lacunar cells are also present.   The neoplastic cells are CD30, CD15 (subset), Pax-5 (dim) and CD20 positive by immunohistochemistry. EBV by in-situ hybridization is positive in the Reed-Sternberg cells. CD3 highlights the background  T-cells.  EBV in-situ hybridization is negative.   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    03/14/2020 Initial Diagnosis   Hodgkin lymphoma ([name])   03/14/2020 Cancer Staging   Staging form: Hodgkin and Non-Hodgkin Lymphoma, AJCC 8th Edition - Clinical stage from 03/14/2020: Stage I (Hodgkin lymphoma, B - Symptoms) - Signed by Mark Lark, MD on 03/14/2020   03/18/2020 Echocardiogram   1. Left ventricular ejection fraction, by estimation, is 55 to 60%. The left ventricle has normal function. The left ventricle has no  regional wall motion abnormalities. Left ventricular diastolic parameters were normal. The average left  ventricular global longitudinal strain is -15.9 %. The global longitudinal strain is normal.  2. Right ventricular systolic function is normal. The right ventricular size is normal. There is normal pulmonary artery systolic pressure.  3. The mitral valve is normal in structure. No evidence of mitral valve regurgitation. No evidence of mitral stenosis.  4. The aortic valve is normal in structure. Aortic valve regurgitation is not visualized. No aortic stenosis is present.  5. The inferior vena cava is normal in size with greater than 50% respiratory variability, suggesting right atrial pressure of 3 mmHg.   03/20/2020 Procedure   Placement of a subcutaneous port device. Catheter tip at the superior cavoatrial junction.     REVIEW OF SYSTEMS:   Constitutional: Denies fevers, chills or abnormal weight loss Eyes: Denies blurriness of vision Ears, nose, mouth, throat, and face: Denies mucositis or sore throat Respiratory: Denies cough, dyspnea or wheezes Cardiovascular: Denies palpitation, chest discomfort or lower extremity swelling Lymphatics: Denies new lymphadenopathy or easy bruising Behavioral/Psych: Mood is stable, no new changes  All other systems were reviewed with the patient and are negative.  I have reviewed the past medical history, past surgical history, social history and family history with the patient and they are unchanged from previous note.  ALLERGIES:  is allergic to amoxicillin.  MEDICATIONS:  Current Outpatient Medications  Medication Sig Dispense Refill  . acetaminophen (TYLENOL) 500 MG tablet Take 1,000 mg by mouth every 6 (six) hours as needed for moderate pain or headache.    . Carboxymethylcellul-Glycerin (LUBRICATING EYE DROPS OP) Place 1 drop into both eyes daily as needed (dry eyes).    . Cholecalciferol (VITAMIN D) 50 MCG (2000 UT) tablet Take 6,000 Units by mouth daily.    Marland Kitchen EPINEPHrine 0.3 mg/0.3 mL IJ SOAJ injection Inject 0.3 mg into the muscle as needed  for anaphylaxis.    Marland Kitchen lidocaine-prilocaine (EMLA) cream Apply to affected area once (Patient taking differently: Apply 1 application topically daily as needed (port access). ) 30 g 3  . Magnesium 250 MG TABS Take 250 mg by mouth daily.    . Multiple Vitamins-Minerals (ZINC PO) Take 2 tablets by mouth daily. With quercetin    . ondansetron (ZOFRAN) 8 MG tablet Take 1 tablet (8 mg total) by mouth every 8 (eight) hours as needed. Start on the third day after chemotherapy. (Patient taking differently: Take 8 mg by mouth every 8 (eight) hours as needed for nausea or vomiting. Start on the third day after chemotherapy.) 30 tablet 1  . OVER THE COUNTER MEDICATION Take 2 capsules by mouth at bedtime as needed (sleep). Stress Relief otc supplement    . oxymetazoline (AFRIN) 0.05 % nasal spray Place 1 spray into both nostrils 2 (two) times daily as needed for congestion.    . polyethylene glycol (MIRALAX / GLYCOLAX) 17 g packet Take 17 g by mouth daily.    . prochlorperazine (COMPAZINE) 10 MG tablet Take 1 tablet (10 mg total) by mouth every 6 (six) hours as needed (Nausea or vomiting). 30 tablet 1  . SUPER B COMPLEX/C PO Take 1 tablet by mouth daily.    . TURMERIC PO Take 2 capsules by mouth daily.     No current facility-administered medications for this visit.   Facility-Administered Medications Ordered in Other Visits  Medication Dose Route Frequency Provider Last Rate Last Admin  . bleomycin (BLEOCIN) 18 Units in sodium chloride 0.9 %  50 mL chemo infusion  8 Units/m2 (Treatment Plan Recorded) Intravenous Once Anderia Lorenzo, MD      . dacarbazine (DTIC) 690 mg in sodium chloride 0.9 % 250 mL chemo infusion  300 mg/m2 (Treatment Plan Recorded) Intravenous Once Alvy Bimler, Vega Withrow, MD      . DOXOrubicin (ADRIAMYCIN) chemo injection 46 mg  20 mg/m2 (Treatment Plan Recorded) Intravenous Once Alvy Bimler, Keyshawna Prouse, MD      . heparin lock flush 100 unit/mL  500 Units Intracatheter Once PRN Alvy Bimler, Ginnifer Creelman, MD      . sodium chloride  flush (NS) 0.9 % injection 10 mL  10 mL Intracatheter PRN Alvy Bimler, Joniel Graumann, MD      . vinBLAStine (VELBAN) 6.9 mg in sodium chloride 0.9 % 50 mL chemo infusion  3 mg/m2 (Treatment Plan Recorded) Intravenous Once Alvy Bimler, Adylene Dlugosz, MD        PHYSICAL EXAMINATION: ECOG PERFORMANCE STATUS: 1 - Symptomatic but completely ambulatory  Vitals:   05/05/20 1053  BP: (!) 124/59  Pulse: 84  Resp: 18  Temp: 97.9 F (36.6 C)  SpO2: 100%   Filed Weights   05/05/20 1053  Weight: 230 lb (104.3 kg)    GENERAL:alert, no distress and comfortable SKIN: Noted mild acne on his face NEURO: alert & oriented x 3 with fluent speech, no focal motor/sensory deficits  LABORATORY DATA:  I have reviewed the data as listed    Component Value Date/Time   NA 137 05/05/2020 1040   NA 141 04/27/2013 1217   K 4.3 05/05/2020 1040   K 4.0 04/27/2013 1217   CL 106 05/05/2020 1040   CL 106 04/26/2012 1550   CO2 24 05/05/2020 1040   CO2 28 04/27/2013 1217   GLUCOSE 100 (H) 05/05/2020 1040   GLUCOSE 92 04/27/2013 1217   GLUCOSE 108 (H) 04/26/2012 1550   BUN 25 (H) 05/05/2020 1040   BUN 10.2 04/27/2013 1217   CREATININE 0.80 05/05/2020 1040   CREATININE 0.8 04/27/2013 1217   CALCIUM 9.4 05/05/2020 1040   CALCIUM 9.7 04/27/2013 1217   PROT 7.4 05/05/2020 1040   PROT 7.5 04/27/2013 1217   ALBUMIN 3.9 05/05/2020 1040   ALBUMIN 3.6 04/27/2013 1217   AST 38 05/05/2020 1040   AST 14 04/27/2013 1217   ALT 71 (H) 05/05/2020 1040   ALT 23 04/27/2013 1217   ALKPHOS 47 05/05/2020 1040   ALKPHOS 64 04/27/2013 1217   BILITOT 0.4 05/05/2020 1040   BILITOT 0.86 04/27/2013 1217   GFRNONAA >60 05/05/2020 1040   GFRAA >60 03/24/2020 0853    No results found for: SPEP, UPEP  Lab Results  Component Value Date   WBC 8.6 05/05/2020   NEUTROABS 4.5 05/05/2020   HGB 13.0 05/05/2020   HCT 38.8 (L) 05/05/2020   MCV 83.1 05/05/2020   PLT 319 05/05/2020      Chemistry      Component Value Date/Time   NA 137 05/05/2020  1040   NA 141 04/27/2013 1217   K 4.3 05/05/2020 1040   K 4.0 04/27/2013 1217   CL 106 05/05/2020 1040   CL 106 04/26/2012 1550   CO2 24 05/05/2020 1040   CO2 28 04/27/2013 1217   BUN 25 (H) 05/05/2020 1040   BUN 10.2 04/27/2013 1217   CREATININE 0.80 05/05/2020 1040   CREATININE 0.8 04/27/2013 1217      Component Value Date/Time   CALCIUM 9.4 05/05/2020 1040   CALCIUM 9.7 04/27/2013 1217   ALKPHOS 47 05/05/2020 1040   ALKPHOS 64 04/27/2013  1217   AST 38 05/05/2020 1040   AST 14 04/27/2013 1217   ALT 71 (H) 05/05/2020 1040   ALT 23 04/27/2013 1217   BILITOT 0.4 05/05/2020 1040   BILITOT 0.86 04/27/2013 1217

## 2020-05-05 NOTE — Assessment & Plan Note (Signed)
He has slight worse abdominal bloating and intermittent abdominal pain I recommend discontinuation of oral iron supplement

## 2020-05-05 NOTE — Patient Instructions (Signed)
[facility] Discharge Instructions for Patients Receiving Chemotherapy  Today you received the following chemotherapy agents: Adriamycin/Vinblastine/Bleomycin/Dacarbazine.  To help prevent nausea and vomiting after your treatment, we encourage you to take your nausea medication as directed.   If you develop nausea and vomiting that is not controlled by your nausea medication, call the clinic.   BELOW ARE SYMPTOMS THAT SHOULD BE REPORTED IMMEDIATELY:  *FEVER GREATER THAN 100.5 F  *CHILLS WITH OR WITHOUT FEVER  NAUSEA AND VOMITING THAT IS NOT CONTROLLED WITH YOUR NAUSEA MEDICATION  *UNUSUAL SHORTNESS OF BREATH  *UNUSUAL BRUISING OR BLEEDING  TENDERNESS IN MOUTH AND THROAT WITH OR WITHOUT PRESENCE OF ULCERS  *URINARY PROBLEMS  *BOWEL PROBLEMS  UNUSUAL RASH Items with * indicate a potential emergency and should be followed up as soon as possible.  Feel free to call the clinic should you have any questions or concerns. The clinic phone number is [phone].  Please show the [place] at check-in to the Emergency Department and triage nurse.

## 2020-05-05 NOTE — Assessment & Plan Note (Signed)
This is much improved since our last dose adjustment Continued close observation

## 2020-05-05 NOTE — Assessment & Plan Note (Signed)
He has achieved complete response to therapy on recent exam I plan to continue on mildly reduced dose of steroids due to insomnia I plan to reduce the dose of vinblastine due to slight worsening neuropathy He is scheduled for PET CT scan before I see him back for objective assessment of response to therapy

## 2020-05-05 NOTE — Patient Instructions (Signed)

## 2020-05-05 NOTE — Assessment & Plan Note (Signed)
Likely due to recent treatment Observe for now

## 2020-05-06 ENCOUNTER — Telehealth: Payer: Self-pay

## 2020-05-06 ENCOUNTER — Encounter: Payer: Self-pay | Admitting: Hematology and Oncology

## 2020-05-06 NOTE — Telephone Encounter (Signed)
Called regarding mychart message. Form is ready for pick up. He verbalized understanding. Left out front in registration for pick per his request.

## 2020-05-09 ENCOUNTER — Telehealth: Payer: Self-pay

## 2020-05-09 NOTE — Telephone Encounter (Signed)
Pt called reporting severe coughing X 2 days.  Coughing leading to vomiting and nausea. Pt reports anti emetics are helping. Denies any fever.  Patient is s/p D15C2 of Bleomycin,Dacarbazine, Doxorubicin, and Vinblastine.    Pt able to tolerate small meals and fluids orally at home.  Pt stated, "I feel like the vomiting came from the coughing spell."    Will forward for MD to review/recommendations.

## 2020-05-09 NOTE — Telephone Encounter (Signed)
I have not room to add him on today I recommend he proceed to urgent care for evaluation

## 2020-05-09 NOTE — Telephone Encounter (Signed)
RN notified patient of MD recommendations.  Pt verbalized understanding and agreement to be evaluated by urgent care.

## 2020-05-12 ENCOUNTER — Telehealth: Payer: Self-pay

## 2020-05-12 NOTE — Telephone Encounter (Signed)
Called and left a message asking him to call the office back. Calling to see how he is doing.

## 2020-05-12 NOTE — Telephone Encounter (Signed)
Called back. He is feeling better and did not go to urgent care. Eating and drinking with no problems. Still has a lingering cough at times.  He does not feel that he needs appt to be evaluated.  Instructed to call the office back if needed. He verbalized understanding.

## 2020-05-16 ENCOUNTER — Other Ambulatory Visit: Payer: Self-pay

## 2020-05-16 ENCOUNTER — Ambulatory Visit (HOSPITAL_COMMUNITY)
Admission: RE | Admit: 2020-05-16 | Discharge: 2020-05-16 | Disposition: A | Discharge status: Home / Self Care | Payer: 59 | Source: Ambulatory Visit | Attending: Hematology and Oncology | Admitting: Hematology and Oncology

## 2020-05-16 DIAGNOSIS — C8111 Nodular sclerosis classical Hodgkin lymphoma, lymph nodes of head, face, and neck: Secondary | ICD-10-CM | POA: Insufficient documentation

## 2020-05-16 LAB — GLUCOSE, CAPILLARY: Glucose-Capillary: 98 mg/dL (ref 70–99)

## 2020-05-16 MED ORDER — FLUDEOXYGLUCOSE F - 18 (FDG) INJECTION
11.4200 | Freq: Once | INTRAVENOUS | Status: AC
  Administered 2020-05-16: 11.42 via INTRAVENOUS

## 2020-05-19 ENCOUNTER — Telehealth: Payer: Self-pay

## 2020-05-19 ENCOUNTER — Inpatient Hospital Stay: Payer: 59

## 2020-05-19 ENCOUNTER — Inpatient Hospital Stay (HOSPITAL_BASED_OUTPATIENT_CLINIC_OR_DEPARTMENT_OTHER): Payer: 59 | Admitting: Hematology and Oncology

## 2020-05-19 ENCOUNTER — Telehealth: Payer: Self-pay | Admitting: Hematology and Oncology

## 2020-05-19 ENCOUNTER — Other Ambulatory Visit: Payer: Self-pay

## 2020-05-19 ENCOUNTER — Encounter: Payer: Self-pay | Admitting: Hematology and Oncology

## 2020-05-19 VITALS — BP 127/58 | HR 86 | Temp 97.9°F | Resp 18 | Ht 77.0 in | Wt 226.0 lb

## 2020-05-19 DIAGNOSIS — G620 Drug-induced polyneuropathy: Secondary | ICD-10-CM | POA: Diagnosis not present

## 2020-05-19 DIAGNOSIS — T451X5A Adverse effect of antineoplastic and immunosuppressive drugs, initial encounter: Secondary | ICD-10-CM

## 2020-05-19 DIAGNOSIS — Z5111 Encounter for antineoplastic chemotherapy: Secondary | ICD-10-CM | POA: Diagnosis not present

## 2020-05-19 DIAGNOSIS — C8111 Nodular sclerosis classical Hodgkin lymphoma, lymph nodes of head, face, and neck: Secondary | ICD-10-CM

## 2020-05-19 DIAGNOSIS — K50919 Crohn's disease, unspecified, with unspecified complications: Secondary | ICD-10-CM

## 2020-05-19 DIAGNOSIS — D509 Iron deficiency anemia, unspecified: Secondary | ICD-10-CM

## 2020-05-19 DIAGNOSIS — R110 Nausea: Secondary | ICD-10-CM | POA: Diagnosis not present

## 2020-05-19 LAB — CMP (CANCER CENTER ONLY)
ALT: 22 U/L (ref 0–44)
AST: 19 U/L (ref 15–41)
Albumin: 3.8 g/dL (ref 3.5–5.0)
Alkaline Phosphatase: 57 U/L (ref 38–126)
Anion gap: 6 (ref 5–15)
BUN: 23 mg/dL — ABNORMAL HIGH (ref 6–20)
CO2: 25 mmol/L (ref 22–32)
Calcium: 9.3 mg/dL (ref 8.9–10.3)
Chloride: 106 mmol/L (ref 98–111)
Creatinine: 0.85 mg/dL (ref 0.61–1.24)
GFR, Estimated: >60 mL/min (ref >60)
Glucose, Bld: 85 mg/dL (ref 70–99)
Potassium: 4.3 mmol/L (ref 3.5–5.1)
Sodium: 137 mmol/L (ref 135–145)
Total Bilirubin: 0.4 mg/dL (ref 0.3–1.2)
Total Protein: 7.6 g/dL (ref 6.5–8.1)

## 2020-05-19 LAB — CBC WITH DIFFERENTIAL (CANCER CENTER ONLY)
Abs Immature Granulocytes: 0.02 10*3/uL (ref 0.00–0.07)
Basophils Absolute: 0.1 10*3/uL (ref 0.0–0.1)
Basophils Relative: 1 %
Eosinophils Absolute: 0.6 10*3/uL — ABNORMAL HIGH (ref 0.0–0.5)
Eosinophils Relative: 7 %
HCT: 40.4 % (ref 39.0–52.0)
Hemoglobin: 13.4 g/dL (ref 13.0–17.0)
Immature Granulocytes: 0 %
Lymphocytes Relative: 19 %
Lymphs Abs: 1.8 10*3/uL (ref 0.7–4.0)
MCH: 28.4 pg (ref 26.0–34.0)
MCHC: 33.2 g/dL (ref 30.0–36.0)
MCV: 85.6 fL (ref 80.0–100.0)
Monocytes Absolute: 1.6 10*3/uL — ABNORMAL HIGH (ref 0.1–1.0)
Monocytes Relative: 17 %
Neutro Abs: 5.6 10*3/uL (ref 1.7–7.7)
Neutrophils Relative %: 56 %
Platelet Count: 336 10*3/uL (ref 150–400)
RBC: 4.72 MIL/uL (ref 4.22–5.81)
RDW: 20.9 % — ABNORMAL HIGH (ref 11.5–15.5)
WBC Count: 9.8 10*3/uL (ref 4.0–10.5)
nRBC: 0 % (ref 0.0–0.2)

## 2020-05-19 MED ORDER — SODIUM CHLORIDE 0.9% FLUSH
10.0000 mL | INTRAVENOUS | Status: DC | PRN
  Administered 2020-05-19: 10 mL
  Filled 2020-05-19: qty 10

## 2020-05-19 MED ORDER — SODIUM CHLORIDE 0.9% FLUSH
10.0000 mL | Freq: Once | INTRAVENOUS | Status: DC
  Filled 2020-05-19: qty 10

## 2020-05-19 MED ORDER — HEPARIN SOD (PORK) LOCK FLUSH 100 UNIT/ML IV SOLN
500.0000 [IU] | Freq: Once | INTRAVENOUS | Status: AC | PRN
  Administered 2020-05-19: 500 [IU]
  Filled 2020-05-19: qty 5

## 2020-05-19 MED ORDER — DOXORUBICIN HCL CHEMO IV INJECTION 2 MG/ML
20.0000 mg/m2 | Freq: Once | INTRAVENOUS | Status: AC
  Administered 2020-05-19: 46 mg via INTRAVENOUS
  Filled 2020-05-19: qty 23

## 2020-05-19 MED ORDER — SODIUM CHLORIDE 0.9 % IV SOLN
150.0000 mg | Freq: Once | INTRAVENOUS | Status: AC
  Administered 2020-05-19: 150 mg via INTRAVENOUS
  Filled 2020-05-19: qty 150

## 2020-05-19 MED ORDER — PALONOSETRON HCL INJECTION 0.25 MG/5ML
0.2500 mg | Freq: Once | INTRAVENOUS | Status: AC
  Administered 2020-05-19: 0.25 mg via INTRAVENOUS

## 2020-05-19 MED ORDER — SODIUM CHLORIDE 0.9 % IV SOLN
Freq: Once | INTRAVENOUS | Status: AC
  Filled 2020-05-19: qty 250

## 2020-05-19 MED ORDER — DEXAMETHASONE SODIUM PHOSPHATE 10 MG/ML IJ SOLN
5.0000 mg | Freq: Once | INTRAMUSCULAR | Status: AC
  Administered 2020-05-19: 5 mg via INTRAVENOUS

## 2020-05-19 MED ORDER — DEXAMETHASONE SODIUM PHOSPHATE 10 MG/ML IJ SOLN
INTRAMUSCULAR | Status: AC
  Filled 2020-05-19: qty 1

## 2020-05-19 MED ORDER — SODIUM CHLORIDE 0.9 % IV SOLN
300.0000 mg/m2 | Freq: Once | INTRAVENOUS | Status: AC
  Administered 2020-05-19: 690 mg via INTRAVENOUS
  Filled 2020-05-19: qty 69

## 2020-05-19 MED ORDER — SODIUM CHLORIDE 0.9% FLUSH
10.0000 mL | Freq: Once | INTRAVENOUS | Status: AC
  Administered 2020-05-19: 10 mL
  Filled 2020-05-19: qty 10

## 2020-05-19 MED ORDER — PALONOSETRON HCL INJECTION 0.25 MG/5ML
INTRAVENOUS | Status: AC
  Filled 2020-05-19: qty 5

## 2020-05-19 MED ORDER — VINBLASTINE SULFATE CHEMO INJECTION 1 MG/ML
3.0000 mg/m2 | Freq: Once | INTRAVENOUS | Status: AC
  Administered 2020-05-19: 6.9 mg via INTRAVENOUS
  Filled 2020-05-19: qty 6.9

## 2020-05-19 NOTE — Assessment & Plan Note (Signed)
Neuropathy is stable since recent dose adjustment We will continue with reduced dose treatment

## 2020-05-19 NOTE — Telephone Encounter (Signed)
Scheduled appts per 11/22 los. Gave pt a print out of AVS.

## 2020-05-19 NOTE — Assessment & Plan Note (Signed)
I reviewed the PET CT scan with the patient and his girlfriend He has complete response to therapy With a small lymph node at level 2 and nonspecific We will observe only I reviewed the current guidelines We discussed approach of either completing chemo with 4 cycles of therapy versus 2 cycles plus involved field radiation treatment He is in favor of 4 more cycles of chemotherapy I plan to drop bleomycin I plan to order echocardiogram before his next visit We will continue minor dose adjustment for vinblastine due to persistent neuropathy

## 2020-05-19 NOTE — Assessment & Plan Note (Signed)
He will continue antiemetics

## 2020-05-19 NOTE — Patient Instructions (Signed)
[facility] Discharge Instructions for Patients Receiving Chemotherapy  Today you received the following chemotherapy agents: Adriamycin/Vinblastine/Bleomycin/Dacarbazine.  To help prevent nausea and vomiting after your treatment, we encourage you to take your nausea medication as directed.   If you develop nausea and vomiting that is not controlled by your nausea medication, call the clinic.   BELOW ARE SYMPTOMS THAT SHOULD BE REPORTED IMMEDIATELY:  *FEVER GREATER THAN 100.5 F  *CHILLS WITH OR WITHOUT FEVER  NAUSEA AND VOMITING THAT IS NOT CONTROLLED WITH YOUR NAUSEA MEDICATION  *UNUSUAL SHORTNESS OF BREATH  *UNUSUAL BRUISING OR BLEEDING  TENDERNESS IN MOUTH AND THROAT WITH OR WITHOUT PRESENCE OF ULCERS  *URINARY PROBLEMS  *BOWEL PROBLEMS  UNUSUAL RASH Items with * indicate a potential emergency and should be followed up as soon as possible.  Feel free to call the clinic should you have any questions or concerns. The clinic phone number is [phone].  Please show the [facility] at check-in to the Emergency Department and triage nurse.

## 2020-05-19 NOTE — Telephone Encounter (Signed)
-----   Message from [name], MD sent at 05/19/2020 12:06 PM EST ----- Regarding: echo on the week of 12/13 Pls help schedule repeat ECHO

## 2020-05-19 NOTE — Telephone Encounter (Signed)
Called and given appt for echo on 12/14 at 9 am, arrive at [facility]. He verbalized understanding.

## 2020-05-19 NOTE — Progress Notes (Signed)
[facility] PROGRESS NOTE  Patient Care Team: [name], MD as PCP - General (Family Medicine) [name], MD as Attending Physician (Family Medicine) [name], MD (Inactive) (Gastroenterology)  ASSESSMENT & PLAN:  Hodgkin lymphoma [facility]) I reviewed the PET CT scan with the patient and his girlfriend He has complete response to therapy With a small lymph node at level 2 and nonspecific We will observe only I reviewed the current guidelines We discussed approach of either completing chemo with 4 cycles of therapy versus 2 cycles plus involved field radiation treatment He is in favor of 4 more cycles of chemotherapy I plan to drop bleomycin I plan to order echocardiogram before his next visit We will continue minor dose adjustment for vinblastine due to persistent neuropathy  Peripheral neuropathy due to chemotherapy ([place]) Neuropathy is stable since recent dose adjustment We will continue with reduced dose treatment  Nausea without vomiting He will continue antiemetics  Crohn disease ([place]) He denies recent flare of Crohn's disease Observe closely   Orders Placed This Encounter  Procedures  . ECHOCARDIOGRAM COMPLETE    Standing Status:   Future    Standing Expiration Date:   05/19/2021    Order Specific Question:   Where should this test be performed    Answer:   [facility]    Order Specific Question:   Perflutren DEFINITY (image enhancing agent) should be administered unless hypersensitivity or allergy exist    Answer:   Administer Perflutren    Order Specific Question:   Reason for exam-Echo    Answer:   Chemo  V67.2 / Z09    All questions were answered. The patient knows to call the clinic with any problems, questions or concerns. The total time spent in the appointment was 30 minutes encounter with patients including review of chart and various tests results, discussions about plan of care and coordination of care plan   [name],  MD 05/19/2020 12:05 PM  INTERVAL HISTORY: Please see below for problem oriented charting. He returns to review test results He tolerated last cycle of treatment well With reduced dose chemo, his neuropathy has not progressed He has some nausea, alleviated by antiemetics Denies recent cough, chest pain or shortness of breath No recent bleeding from Crohn's He continues to exercise regularly in the gym  SUMMARY OF ONCOLOGIC HISTORY: Oncology History  Hodgkin lymphoma ([place])  03/10/2020 Pathology Results   A. LYMPH NODE, RIGHT SUBMANDIBULAR, BIOPSY:  -  Classic Hodgkin lymphoma  -  See comment   COMMENT:   The submitted lymph nodes are effaced by a vaguely nodular lymphoid proliferation separated by bands of fibrosis with admixed inflammatory cells including neutrophils and eosinophils. The lymphoid proliferation  is composed predominantly of small mature lymphocytes with scattered large atypical cells. The atypical cells have mono and multi-lobated nuclei and prominent nucleoli characteristic of Reed-Sternberg cells.  Lacunar cells are also present.   The neoplastic cells are CD30, CD15 (subset), Pax-5 (dim) and CD20 positive by immunohistochemistry. EBV by in-situ hybridization is positive in the Reed-Sternberg cells. CD3 highlights the background  T-cells.  EBV in-situ hybridization is negative.   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    03/14/2020 Initial Diagnosis   Hodgkin lymphoma ([place])   03/14/2020 Cancer Staging   Staging form: Hodgkin and Non-Hodgkin Lymphoma, AJCC 8th Edition - Clinical stage from 03/14/2020: Stage I (Hodgkin lymphoma, B - Symptoms) - Signed  by [name], MD on 03/14/2020   03/18/2020 Echocardiogram   1. Left ventricular ejection fraction, by estimation, is 55 to 60%. The left ventricle has normal function. The left ventricle has no regional wall  motion abnormalities. Left ventricular diastolic parameters were normal. The average left ventricular global longitudinal strain is -15.9 %. The global longitudinal strain is normal.  2. Right ventricular systolic function is normal. The right ventricular size is normal. There is normal pulmonary artery systolic pressure.  3. The mitral valve is normal in structure. No evidence of mitral valve regurgitation. No evidence of mitral stenosis.  4. The aortic valve is normal in structure. Aortic valve regurgitation is not visualized. No aortic stenosis is present.  5. The inferior vena cava is normal in size with greater than 50% respiratory variability, suggesting right atrial pressure of 3 mmHg.   03/20/2020 Procedure   Placement of a subcutaneous port device. Catheter tip at the superior cavoatrial junction.   03/24/2020 -  Chemotherapy   The patient had ABVD for chemotherapy treatment.     05/16/2020 PET scan   1. Small hypermetabolic RIGHT level II lymph node is indeterminate. Activity is similar to liver activity. No comparison available. 2. Reduction in size and no significant metabolic activity of RIGHT supraclavicular lymph node ( [place] 1). 3. No evidence lymphoma chest, abdomen pelvis.  Normal spleen.  4. Intense metabolic activity localizing to LEFT paraspinal musculature in the cervical neck. Favor benign physiologic muscle musculature in the LEFT neck. Recommend clinical correlation for muscle spasm or trauma. If concern for unlikely muscular lymphoma, consider contrast CT or MRI of the neck.     REVIEW OF SYSTEMS:   Constitutional: Denies fevers, chills or abnormal weight loss Eyes: Denies blurriness of vision Ears, nose, mouth, throat, and face: Denies mucositis or sore throat Respiratory: Denies cough, dyspnea or wheezes Cardiovascular: Denies palpitation, chest discomfort or lower extremity swelling Skin: Denies abnormal skin rashes Lymphatics: Denies new lymphadenopathy or  easy bruising Behavioral/Psych: Mood is stable, no new changes  All other systems were reviewed with the patient and are negative.  I have reviewed the past medical history, past surgical history, social history and family history with the patient and they are unchanged from previous note.  ALLERGIES:  is allergic to amoxicillin.  MEDICATIONS:  Current Outpatient Medications  Medication Sig Dispense Refill  . acetaminophen (TYLENOL) 500 MG tablet Take 1,000 mg by mouth every 6 (six) hours as needed for moderate pain or headache.    . Carboxymethylcellul-Glycerin (LUBRICATING EYE DROPS OP) Place 1 drop into both eyes daily as needed (dry eyes).    . Cholecalciferol (VITAMIN D) 50 MCG (2000 UT) tablet Take 6,000 Units by mouth daily.    Marland Kitchen EPINEPHrine 0.3 mg/0.3 mL IJ SOAJ injection Inject 0.3 mg into the muscle as needed for anaphylaxis.    Marland Kitchen lidocaine-prilocaine (EMLA) cream Apply to affected area once (Patient taking differently: Apply 1 application topically daily as needed (port access). ) 30 g 3  . Magnesium 250 MG TABS Take 250 mg by mouth daily.    . Multiple Vitamins-Minerals (ZINC PO) Take 2 tablets by mouth daily. With quercetin    . ondansetron (ZOFRAN) 8 MG tablet Take 1 tablet (8 mg total) by mouth every 8 (eight) hours as needed. Start on the third day after chemotherapy. (Patient taking differently: Take 8 mg by mouth every 8 (eight) hours as needed for nausea or vomiting. Start on the third day after chemotherapy.) 30 tablet 1  .  OVER THE COUNTER MEDICATION Take 2 capsules by mouth at bedtime as needed (sleep). Stress Relief otc supplement    . oxymetazoline (AFRIN) 0.05 % nasal spray Place 1 spray into both nostrils 2 (two) times daily as needed for congestion.    . polyethylene glycol (MIRALAX / GLYCOLAX) 17 g packet Take 17 g by mouth daily.    . prochlorperazine (COMPAZINE) 10 MG tablet Take 1 tablet (10 mg total) by mouth every 6 (six) hours as needed (Nausea or vomiting). 30  tablet 1  . SUPER B COMPLEX/C PO Take 1 tablet by mouth daily.    . TURMERIC PO Take 2 capsules by mouth daily.     No current facility-administered medications for this visit.   Facility-Administered Medications Ordered in Other Visits  Medication Dose Route Frequency Provider Last Rate Last Admin  . dacarbazine (DTIC) 690 mg in sodium chloride 0.9 % 250 mL chemo infusion  300 mg/m2 (Treatment Plan Recorded) Intravenous Once Alvy Bimler, Nezzie Manera, MD      . DOXOrubicin (ADRIAMYCIN) chemo injection 46 mg  20 mg/m2 (Treatment Plan Recorded) Intravenous Once Alvy Bimler, Moe Brier, MD      . fosaprepitant (EMEND) 150 mg in sodium chloride 0.9 % 145 mL IVPB  150 mg Intravenous Once Alvy Bimler, Izic Stfort, MD      . heparin lock flush 100 unit/mL  500 Units Intracatheter Once PRN Alvy Bimler, Ethel Meisenheimer, MD      . sodium chloride flush (NS) 0.9 % injection 10 mL  10 mL Intracatheter PRN Zia Kanner, MD      . vinBLAStine (VELBAN) 6.9 mg in sodium chloride 0.9 % 50 mL chemo infusion  3 mg/m2 (Treatment Plan Recorded) Intravenous Once Alvy Bimler, Jessica Seidman, MD        PHYSICAL EXAMINATION: ECOG PERFORMANCE STATUS: 1 - Symptomatic but completely ambulatory  Vitals:   05/19/20 1041  BP: (!) 127/58  Pulse: 86  Resp: 18  Temp: 97.9 F (36.6 C)  SpO2: 100%   Filed Weights   05/19/20 1041  Weight: 226 lb (102.5 kg)    GENERAL:alert, no distress and comfortable NEURO: alert & oriented x 3 with fluent speech, no focal motor/sensory deficits  LABORATORY DATA:  I have reviewed the data as listed    Component Value Date/Time   NA 137 05/19/2020 1033   NA 141 04/27/2013 1217   K 4.3 05/19/2020 1033   K 4.0 04/27/2013 1217   CL 106 05/19/2020 1033   CL 106 04/26/2012 1550   CO2 25 05/19/2020 1033   CO2 28 04/27/2013 1217   GLUCOSE 85 05/19/2020 1033   GLUCOSE 92 04/27/2013 1217   GLUCOSE 108 (H) 04/26/2012 1550   BUN 23 (H) 05/19/2020 1033   BUN 10.2 04/27/2013 1217   CREATININE 0.85 05/19/2020 1033   CREATININE 0.8 04/27/2013 1217    CALCIUM 9.3 05/19/2020 1033   CALCIUM 9.7 04/27/2013 1217   PROT 7.6 05/19/2020 1033   PROT 7.5 04/27/2013 1217   ALBUMIN 3.8 05/19/2020 1033   ALBUMIN 3.6 04/27/2013 1217   AST 19 05/19/2020 1033   AST 14 04/27/2013 1217   ALT 22 05/19/2020 1033   ALT 23 04/27/2013 1217   ALKPHOS 57 05/19/2020 1033   ALKPHOS 64 04/27/2013 1217   BILITOT 0.4 05/19/2020 1033   BILITOT 0.86 04/27/2013 1217   GFRNONAA >60 05/19/2020 1033   GFRAA >60 03/24/2020 0853    No results found for: SPEP, UPEP  Lab Results  Component Value Date   WBC 9.8 05/19/2020   NEUTROABS 5.6  05/19/2020   HGB 13.4 05/19/2020   HCT 40.4 05/19/2020   MCV 85.6 05/19/2020   PLT 336 05/19/2020      Chemistry      Component Value Date/Time   NA 137 05/19/2020 1033   NA 141 04/27/2013 1217   K 4.3 05/19/2020 1033   K 4.0 04/27/2013 1217   CL 106 05/19/2020 1033   CL 106 04/26/2012 1550   CO2 25 05/19/2020 1033   CO2 28 04/27/2013 1217   BUN 23 (H) 05/19/2020 1033   BUN 10.2 04/27/2013 1217   CREATININE 0.85 05/19/2020 1033   CREATININE 0.8 04/27/2013 1217      Component Value Date/Time   CALCIUM 9.3 05/19/2020 1033   CALCIUM 9.7 04/27/2013 1217   ALKPHOS 57 05/19/2020 1033   ALKPHOS 64 04/27/2013 1217   AST 19 05/19/2020 1033   AST 14 04/27/2013 1217   ALT 22 05/19/2020 1033   ALT 23 04/27/2013 1217   BILITOT 0.4 05/19/2020 1033   BILITOT 0.86 04/27/2013 1217       RADIOGRAPHIC STUDIES: I reviewed multiple imaging studies with the patient and his girlfriend I have personally reviewed the radiological images as listed and agreed with the findings in the report. NM PET Image Initial (PI) Skull Base To Thigh  Result Date: 05/17/2020 CLINICAL DATA:  Subsequent treatment strategy for Hodgkin's lymphoma. EXAM: NUCLEAR MEDICINE PET SKULL BASE TO THIGH TECHNIQUE: 11.4 mCi F-18 FDG was injected intravenously. Full-ring PET imaging was performed from the skull base to thigh after the radiotracer. CT data was  obtained and used for attenuation correction and anatomic localization. Fasting blood glucose: 98 mg/dl COMPARISON:  CT chest abdomen pelvis 02/13/2020 FINDINGS: Mediastinal blood pool activity: SUV max 2.5 Liver activity: SUV max 2.87 NECK: Small hypermetabolic LEFT level 2 lymph node measures 8 mm on image 24 with SUV max equal 3.4. No comparison available. 10 mm lymph node in the RIGHT supraclavicular nodal region adjacent to the thyroid gland is decreased from 14 mm on comparison CT. No significant metabolic activity (SUV max equal 1.9 which is less than blood pool activity). In the posterior aspect of the LEFT cervical paraspinal musculature there is intense metabolic activity with SUV max equal 6.9. There is no lesion on the noncontrast CT or asymmetry of the musculature. (Image 27) activity is relatively linear on coronal imaging. Incidental CT findings: Port in the anterior chest wall with tip in distal SVC. CHEST: Chest is cluster small nodules in the RIGHT upper lobe measuring 5 mm each (image 68 through 73 of series 4). No associated metabolic activity. Incidental CT findings: none ABDOMEN/PELVIS: No abnormal hypermetabolic activity within the liver, pancreas, adrenal glands, or spleen. No hypermetabolic lymph nodes in the abdomen or pelvis. From Incidental CT findings: No lymphadenopathy SKELETON: No focal hypermetabolic activity to suggest skeletal metastasis. Incidental CT findings: none IMPRESSION: 1. Small hypermetabolic RIGHT level II lymph node is indeterminate. Activity is similar to liver activity. No comparison available. 2. Reduction in size and no significant metabolic activity of RIGHT supraclavicular lymph node ( [place] 1). 3. No evidence lymphoma chest, abdomen pelvis.  Normal spleen. 4. Intense metabolic activity localizing to LEFT paraspinal musculature in the cervical neck. Favor benign physiologic muscle musculature in the LEFT neck. Recommend clinical correlation for muscle spasm or  trauma. If concern for unlikely muscular lymphoma, consider contrast CT or MRI of the neck. Electronically Signed   By: Suzy Bouchard M.D.   On: 05/17/2020 14:45

## 2020-05-19 NOTE — Assessment & Plan Note (Signed)
He denies recent flare of Crohn's disease Observe closely

## 2020-06-02 ENCOUNTER — Other Ambulatory Visit: Payer: Self-pay

## 2020-06-02 ENCOUNTER — Inpatient Hospital Stay: Payer: 59

## 2020-06-02 ENCOUNTER — Inpatient Hospital Stay: Payer: 59 | Attending: Hematology and Oncology

## 2020-06-02 VITALS — BP 126/63 | HR 75 | Temp 98.3°F | Resp 16 | Ht 77.0 in | Wt 225.5 lb

## 2020-06-02 DIAGNOSIS — R590 Localized enlarged lymph nodes: Secondary | ICD-10-CM

## 2020-06-02 DIAGNOSIS — Z5111 Encounter for antineoplastic chemotherapy: Secondary | ICD-10-CM | POA: Diagnosis not present

## 2020-06-02 DIAGNOSIS — C8111 Nodular sclerosis classical Hodgkin lymphoma, lymph nodes of head, face, and neck: Secondary | ICD-10-CM | POA: Diagnosis not present

## 2020-06-02 LAB — COMPREHENSIVE METABOLIC PANEL
ALT: 29 U/L (ref 0–44)
AST: 22 U/L (ref 15–41)
Albumin: 3.9 g/dL (ref 3.5–5.0)
Alkaline Phosphatase: 51 U/L (ref 38–126)
Anion gap: 7 (ref 5–15)
BUN: 17 mg/dL (ref 6–20)
CO2: 26 mmol/L (ref 22–32)
Calcium: 9.5 mg/dL (ref 8.9–10.3)
Chloride: 107 mmol/L (ref 98–111)
Creatinine, Ser: 0.83 mg/dL (ref 0.61–1.24)
GFR, Estimated: >60 mL/min (ref >60)
Glucose, Bld: 92 mg/dL (ref 70–99)
Potassium: 4.2 mmol/L (ref 3.5–5.1)
Sodium: 140 mmol/L (ref 135–145)
Total Bilirubin: 0.3 mg/dL (ref 0.3–1.2)
Total Protein: 7.4 g/dL (ref 6.5–8.1)

## 2020-06-02 LAB — CBC WITH DIFFERENTIAL (CANCER CENTER ONLY)
Abs Immature Granulocytes: 0.01 10*3/uL (ref 0.00–0.07)
Basophils Absolute: 0.1 10*3/uL (ref 0.0–0.1)
Basophils Relative: 1 %
Eosinophils Absolute: 0.6 10*3/uL — ABNORMAL HIGH (ref 0.0–0.5)
Eosinophils Relative: 7 %
HCT: 40.5 % (ref 39.0–52.0)
Hemoglobin: 13.5 g/dL (ref 13.0–17.0)
Immature Granulocytes: 0 %
Lymphocytes Relative: 21 %
Lymphs Abs: 1.7 10*3/uL (ref 0.7–4.0)
MCH: 28.5 pg (ref 26.0–34.0)
MCHC: 33.3 g/dL (ref 30.0–36.0)
MCV: 85.6 fL (ref 80.0–100.0)
Monocytes Absolute: 1.5 10*3/uL — ABNORMAL HIGH (ref 0.1–1.0)
Monocytes Relative: 18 %
Neutro Abs: 4.5 10*3/uL (ref 1.7–7.7)
Neutrophils Relative %: 53 %
Platelet Count: 289 10*3/uL (ref 150–400)
RBC: 4.73 MIL/uL (ref 4.22–5.81)
RDW: 20.2 % — ABNORMAL HIGH (ref 11.5–15.5)
WBC Count: 8.3 10*3/uL (ref 4.0–10.5)
nRBC: 0 % (ref 0.0–0.2)

## 2020-06-02 MED ORDER — VINBLASTINE SULFATE CHEMO INJECTION 1 MG/ML
3.0000 mg/m2 | Freq: Once | INTRAVENOUS | Status: AC
  Administered 2020-06-02: 6.9 mg via INTRAVENOUS
  Filled 2020-06-02: qty 6.9

## 2020-06-02 MED ORDER — SODIUM CHLORIDE 0.9 % IV SOLN
Freq: Once | INTRAVENOUS | Status: AC
  Filled 2020-06-02: qty 250

## 2020-06-02 MED ORDER — DEXAMETHASONE SODIUM PHOSPHATE 10 MG/ML IJ SOLN
5.0000 mg | Freq: Once | INTRAMUSCULAR | Status: AC
  Administered 2020-06-02: 5 mg via INTRAVENOUS

## 2020-06-02 MED ORDER — SODIUM CHLORIDE 0.9 % IV SOLN
300.0000 mg/m2 | Freq: Once | INTRAVENOUS | Status: AC
  Administered 2020-06-02: 690 mg via INTRAVENOUS
  Filled 2020-06-02: qty 69

## 2020-06-02 MED ORDER — SODIUM CHLORIDE 0.9 % IV SOLN
150.0000 mg | Freq: Once | INTRAVENOUS | Status: AC
  Administered 2020-06-02: 150 mg via INTRAVENOUS
  Filled 2020-06-02: qty 150

## 2020-06-02 MED ORDER — SODIUM CHLORIDE 0.9% FLUSH
10.0000 mL | INTRAVENOUS | Status: DC | PRN
  Administered 2020-06-02: 10 mL
  Filled 2020-06-02: qty 10

## 2020-06-02 MED ORDER — PALONOSETRON HCL INJECTION 0.25 MG/5ML
INTRAVENOUS | Status: AC
  Filled 2020-06-02: qty 5

## 2020-06-02 MED ORDER — HEPARIN SOD (PORK) LOCK FLUSH 100 UNIT/ML IV SOLN
500.0000 [IU] | Freq: Once | INTRAVENOUS | Status: AC | PRN
  Administered 2020-06-02: 500 [IU]
  Filled 2020-06-02: qty 5

## 2020-06-02 MED ORDER — DOXORUBICIN HCL CHEMO IV INJECTION 2 MG/ML
20.0000 mg/m2 | Freq: Once | INTRAVENOUS | Status: AC
  Administered 2020-06-02: 46 mg via INTRAVENOUS
  Filled 2020-06-02: qty 23

## 2020-06-02 MED ORDER — DEXAMETHASONE SODIUM PHOSPHATE 10 MG/ML IJ SOLN
INTRAMUSCULAR | Status: AC
  Filled 2020-06-02: qty 1

## 2020-06-02 MED ORDER — PALONOSETRON HCL INJECTION 0.25 MG/5ML
0.2500 mg | Freq: Once | INTRAVENOUS | Status: AC
  Administered 2020-06-02: 0.25 mg via INTRAVENOUS

## 2020-06-02 NOTE — Patient Instructions (Signed)
[facility] Discharge Instructions for Patients Receiving Chemotherapy  Today you received the following chemotherapy agents: Doxorubicin, Vinblastine, and Dacarbazine  To help prevent nausea and vomiting after your treatment, we encourage you to take your nausea medication  as prescribed.    If you develop nausea and vomiting that is not controlled by your nausea medication, call the clinic.   BELOW ARE SYMPTOMS THAT SHOULD BE REPORTED IMMEDIATELY:  *FEVER GREATER THAN 100.5 F  *CHILLS WITH OR WITHOUT FEVER  NAUSEA AND VOMITING THAT IS NOT CONTROLLED WITH YOUR NAUSEA MEDICATION  *UNUSUAL SHORTNESS OF BREATH  *UNUSUAL BRUISING OR BLEEDING  TENDERNESS IN MOUTH AND THROAT WITH OR WITHOUT PRESENCE OF ULCERS  *URINARY PROBLEMS  *BOWEL PROBLEMS  UNUSUAL RASH Items with * indicate a potential emergency and should be followed up as soon as possible.  Feel free to call the clinic should you have any questions or concerns. The clinic phone number is [phone].  Please show the [facility] at check-in to the Emergency Department and triage nurse.

## 2020-06-02 NOTE — Progress Notes (Signed)
Patient discharged from the [facility] in stable condition and with no signs of acute distress.

## 2020-06-10 ENCOUNTER — Ambulatory Visit (HOSPITAL_COMMUNITY)
Admission: RE | Admit: 2020-06-10 | Discharge: 2020-06-10 | Disposition: A | Discharge status: Home / Self Care | Payer: 59 | Source: Ambulatory Visit | Attending: Hematology and Oncology | Admitting: Hematology and Oncology

## 2020-06-10 ENCOUNTER — Other Ambulatory Visit: Payer: Self-pay

## 2020-06-10 DIAGNOSIS — Z5111 Encounter for antineoplastic chemotherapy: Secondary | ICD-10-CM | POA: Diagnosis not present

## 2020-06-10 DIAGNOSIS — C8111 Nodular sclerosis classical Hodgkin lymphoma, lymph nodes of head, face, and neck: Secondary | ICD-10-CM | POA: Diagnosis not present

## 2020-06-10 DIAGNOSIS — Z0189 Encounter for other specified special examinations: Secondary | ICD-10-CM

## 2020-06-10 DIAGNOSIS — Z5181 Encounter for therapeutic drug level monitoring: Secondary | ICD-10-CM | POA: Insufficient documentation

## 2020-06-10 DIAGNOSIS — R509 Fever, unspecified: Secondary | ICD-10-CM | POA: Insufficient documentation

## 2020-06-10 LAB — ECHOCARDIOGRAM COMPLETE
Area-P 1/2: 4.89 cm2
Calc EF: 58.7 %
S' Lateral: 3.6 cm
Single Plane A2C EF: 58.3 %
Single Plane A4C EF: 61.4 %

## 2020-06-10 NOTE — Progress Notes (Signed)
  Echocardiogram 2D Echocardiogram has been performed.  Mark Decker 06/10/2020, 9:59 AM

## 2020-06-16 ENCOUNTER — Encounter: Payer: Self-pay | Admitting: Hematology and Oncology

## 2020-06-16 ENCOUNTER — Inpatient Hospital Stay: Payer: 59

## 2020-06-16 ENCOUNTER — Other Ambulatory Visit: Payer: Self-pay

## 2020-06-16 ENCOUNTER — Inpatient Hospital Stay (HOSPITAL_BASED_OUTPATIENT_CLINIC_OR_DEPARTMENT_OTHER): Payer: 59 | Admitting: Hematology and Oncology

## 2020-06-16 DIAGNOSIS — R5383 Other fatigue: Secondary | ICD-10-CM | POA: Diagnosis not present

## 2020-06-16 DIAGNOSIS — K50919 Crohn's disease, unspecified, with unspecified complications: Secondary | ICD-10-CM

## 2020-06-16 DIAGNOSIS — G620 Drug-induced polyneuropathy: Secondary | ICD-10-CM | POA: Diagnosis not present

## 2020-06-16 DIAGNOSIS — C8111 Nodular sclerosis classical Hodgkin lymphoma, lymph nodes of head, face, and neck: Secondary | ICD-10-CM

## 2020-06-16 DIAGNOSIS — Z5111 Encounter for antineoplastic chemotherapy: Secondary | ICD-10-CM | POA: Diagnosis not present

## 2020-06-16 DIAGNOSIS — T451X5A Adverse effect of antineoplastic and immunosuppressive drugs, initial encounter: Secondary | ICD-10-CM

## 2020-06-16 DIAGNOSIS — D509 Iron deficiency anemia, unspecified: Secondary | ICD-10-CM

## 2020-06-16 LAB — CBC WITH DIFFERENTIAL (CANCER CENTER ONLY)
Abs Immature Granulocytes: 0.02 10*3/uL (ref 0.00–0.07)
Basophils Absolute: 0.1 10*3/uL (ref 0.0–0.1)
Basophils Relative: 1 %
Eosinophils Absolute: 0.4 10*3/uL (ref 0.0–0.5)
Eosinophils Relative: 5 %
HCT: 40.3 % (ref 39.0–52.0)
Hemoglobin: 13.7 g/dL (ref 13.0–17.0)
Immature Granulocytes: 0 %
Lymphocytes Relative: 21 %
Lymphs Abs: 1.6 10*3/uL (ref 0.7–4.0)
MCH: 29.3 pg (ref 26.0–34.0)
MCHC: 34 g/dL (ref 30.0–36.0)
MCV: 86.1 fL (ref 80.0–100.0)
Monocytes Absolute: 1.2 10*3/uL — ABNORMAL HIGH (ref 0.1–1.0)
Monocytes Relative: 15 %
Neutro Abs: 4.6 10*3/uL (ref 1.7–7.7)
Neutrophils Relative %: 58 %
Platelet Count: 247 10*3/uL (ref 150–400)
RBC: 4.68 MIL/uL (ref 4.22–5.81)
RDW: 18.7 % — ABNORMAL HIGH (ref 11.5–15.5)
WBC Count: 8 10*3/uL (ref 4.0–10.5)
nRBC: 0 % (ref 0.0–0.2)

## 2020-06-16 LAB — CMP (CANCER CENTER ONLY)
ALT: 25 U/L (ref 0–44)
AST: 17 U/L (ref 15–41)
Albumin: 3.9 g/dL (ref 3.5–5.0)
Alkaline Phosphatase: 52 U/L (ref 38–126)
Anion gap: 5 (ref 5–15)
BUN: 21 mg/dL — ABNORMAL HIGH (ref 6–20)
CO2: 26 mmol/L (ref 22–32)
Calcium: 9.2 mg/dL (ref 8.9–10.3)
Chloride: 108 mmol/L (ref 98–111)
Creatinine: 0.8 mg/dL (ref 0.61–1.24)
GFR, Estimated: >60 mL/min (ref >60)
Glucose, Bld: 99 mg/dL (ref 70–99)
Potassium: 4.5 mmol/L (ref 3.5–5.1)
Sodium: 139 mmol/L (ref 135–145)
Total Bilirubin: 0.3 mg/dL (ref 0.3–1.2)
Total Protein: 7.4 g/dL (ref 6.5–8.1)

## 2020-06-16 MED ORDER — DEXAMETHASONE SODIUM PHOSPHATE 10 MG/ML IJ SOLN
INTRAMUSCULAR | Status: AC
  Filled 2020-06-16: qty 1

## 2020-06-16 MED ORDER — HEPARIN SOD (PORK) LOCK FLUSH 100 UNIT/ML IV SOLN
500.0000 [IU] | Freq: Once | INTRAVENOUS | Status: DC
  Filled 2020-06-16: qty 5

## 2020-06-16 MED ORDER — PALONOSETRON HCL INJECTION 0.25 MG/5ML
0.2500 mg | Freq: Once | INTRAVENOUS | Status: AC
  Administered 2020-06-16: 0.25 mg via INTRAVENOUS

## 2020-06-16 MED ORDER — PROCHLORPERAZINE EDISYLATE 10 MG/2ML IJ SOLN
10.0000 mg | Freq: Once | INTRAMUSCULAR | Status: AC
  Administered 2020-06-16: 10 mg via INTRAVENOUS

## 2020-06-16 MED ORDER — HEPARIN SOD (PORK) LOCK FLUSH 100 UNIT/ML IV SOLN
500.0000 [IU] | Freq: Once | INTRAVENOUS | Status: AC | PRN
  Administered 2020-06-16: 500 [IU]
  Filled 2020-06-16: qty 5

## 2020-06-16 MED ORDER — SODIUM CHLORIDE 0.9 % IV SOLN
150.0000 mg | Freq: Once | INTRAVENOUS | Status: AC
  Administered 2020-06-16: 150 mg via INTRAVENOUS
  Filled 2020-06-16: qty 150

## 2020-06-16 MED ORDER — SODIUM CHLORIDE 0.9% FLUSH
10.0000 mL | Freq: Once | INTRAVENOUS | Status: AC
  Administered 2020-06-16: 10 mL
  Filled 2020-06-16: qty 10

## 2020-06-16 MED ORDER — SODIUM CHLORIDE 0.9 % IV SOLN
300.0000 mg/m2 | Freq: Once | INTRAVENOUS | Status: AC
  Administered 2020-06-16: 690 mg via INTRAVENOUS
  Filled 2020-06-16: qty 69

## 2020-06-16 MED ORDER — SODIUM CHLORIDE 0.9 % IV SOLN
Freq: Once | INTRAVENOUS | Status: AC
  Filled 2020-06-16: qty 250

## 2020-06-16 MED ORDER — PROCHLORPERAZINE EDISYLATE 10 MG/2ML IJ SOLN
INTRAMUSCULAR | Status: AC
  Filled 2020-06-16: qty 2

## 2020-06-16 MED ORDER — DEXAMETHASONE SODIUM PHOSPHATE 10 MG/ML IJ SOLN
5.0000 mg | Freq: Once | INTRAMUSCULAR | Status: AC
  Administered 2020-06-16: 5 mg via INTRAVENOUS

## 2020-06-16 MED ORDER — PALONOSETRON HCL INJECTION 0.25 MG/5ML
INTRAVENOUS | Status: AC
  Filled 2020-06-16: qty 5

## 2020-06-16 MED ORDER — SODIUM CHLORIDE 0.9% FLUSH
10.0000 mL | INTRAVENOUS | Status: DC | PRN
  Administered 2020-06-16: 10 mL
  Filled 2020-06-16: qty 10

## 2020-06-16 MED ORDER — VINBLASTINE SULFATE CHEMO INJECTION 1 MG/ML
3.0000 mg/m2 | Freq: Once | INTRAVENOUS | Status: AC
  Administered 2020-06-16: 6.9 mg via INTRAVENOUS
  Filled 2020-06-16: qty 6.9

## 2020-06-16 MED ORDER — DOXORUBICIN HCL CHEMO IV INJECTION 2 MG/ML
20.0000 mg/m2 | Freq: Once | INTRAVENOUS | Status: AC
  Administered 2020-06-16: 46 mg via INTRAVENOUS
  Filled 2020-06-16: qty 23

## 2020-06-16 NOTE — Assessment & Plan Note (Signed)
His recent PET CT scan showed complete response to therapy With a small lymph node at level 2 and nonspecific We will observe only The plan would be to continue treatment without bleomycin His recent echocardiogram was within normal limits We will continue minor dose adjustment for vinblastine due to persistent neuropathy

## 2020-06-16 NOTE — Patient Instructions (Signed)
[facility] Discharge Instructions for Patients Receiving Chemotherapy  Today you received the following chemotherapy agents: Doxorubicin, Vinblastine, and Dacarbazine  To help prevent nausea and vomiting after your treatment, we encourage you to take your nausea medication  as prescribed.    If you develop nausea and vomiting that is not controlled by your nausea medication, call the clinic.   BELOW ARE SYMPTOMS THAT SHOULD BE REPORTED IMMEDIATELY:  *FEVER GREATER THAN 100.5 F  *CHILLS WITH OR WITHOUT FEVER  NAUSEA AND VOMITING THAT IS NOT CONTROLLED WITH YOUR NAUSEA MEDICATION  *UNUSUAL SHORTNESS OF BREATH  *UNUSUAL BRUISING OR BLEEDING  TENDERNESS IN MOUTH AND THROAT WITH OR WITHOUT PRESENCE OF ULCERS  *URINARY PROBLEMS  *BOWEL PROBLEMS  UNUSUAL RASH Items with * indicate a potential emergency and should be followed up as soon as possible.  Feel free to call the clinic should you have any questions or concerns. The clinic phone number is [phone].  Please show the CHEMO ALERT CARD at check-in to the Emergency Department and triage nurse.   

## 2020-06-16 NOTE — Progress Notes (Signed)
[facility] OFFICE PROGRESS NOTE  Patient Care Team: Mark Jordan, MD as PCP - General (Family Medicine) Mark Jordan, MD as Attending Physician (Family Medicine) [name], MD (Inactive) (Gastroenterology)  ASSESSMENT & PLAN:  Hodgkin lymphoma [facility]) His recent PET CT scan showed complete response to therapy With a small lymph node at level 2 and nonspecific We will observe only The plan would be to continue treatment without bleomycin His recent echocardiogram was within normal limits We will continue minor dose adjustment for vinblastine due to persistent neuropathy  Peripheral neuropathy due to chemotherapy (HCC) Neuropathy is stable since recent dose adjustment We will continue with reduced dose treatment  Crohn disease ([place]) He denies recent flare of Crohn's disease Observe closely  Other fatigue His recent fatigue and headaches are likely due to side effects of treatment Observe for now   No orders of the defined types were placed in this encounter.   All questions were answered. The patient knows to call the clinic with any problems, questions or concerns. The total time spent in the appointment was 20 minutes encounter with patients including review of chart and various tests results, discussions about plan of care and coordination of care plan   Mark Lark, MD 06/16/2020 2:54 PM  INTERVAL HISTORY: Please see below for problem oriented charting. He returns to be seen prior to cycle 4 of chemotherapy He tolerated last cycle well except for headaches He sleeps poorly, usually averaging only about 6 hours of sleep at night He also have intermittent difficulties with memories No nausea or constipation No recent flare of Crohn's disease No worsening peripheral neuropathy  SUMMARY OF ONCOLOGIC HISTORY: Oncology History  Hodgkin lymphoma ([place])  03/10/2020 Pathology Results   A. LYMPH NODE, RIGHT SUBMANDIBULAR, BIOPSY:  -  Classic Hodgkin  lymphoma  -  See comment   COMMENT:   The submitted lymph nodes are effaced by a vaguely nodular lymphoid proliferation separated by bands of fibrosis with admixed inflammatory cells including neutrophils and eosinophils. The lymphoid proliferation  is composed predominantly of small mature lymphocytes with scattered large atypical cells. The atypical cells have mono and multi-lobated nuclei and prominent nucleoli characteristic of Reed-Sternberg cells.  Lacunar cells are also present.   The neoplastic cells are CD30, CD15 (subset), Pax-5 (dim) and CD20 positive by immunohistochemistry. EBV by in-situ hybridization is positive in the Reed-Sternberg cells. CD3 highlights the background  T-cells.  EBV in-situ hybridization is negative.   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    03/14/2020 Initial Diagnosis   Hodgkin lymphoma ([place])   03/14/2020 Cancer Staging   Staging form: Hodgkin and Non-Hodgkin Lymphoma, AJCC 8th Edition - Clinical stage from 03/14/2020: Stage I (Hodgkin lymphoma, B - Symptoms) - Signed by Mark Lark, MD on 03/14/2020   03/18/2020 Echocardiogram   1. Left ventricular ejection fraction, by estimation, is 55 to 60%. The left ventricle has normal function. The left ventricle has no regional wall motion abnormalities. Left ventricular diastolic parameters were normal. The average left ventricular global longitudinal strain is -15.9 %. The global longitudinal strain is normal.  2. Right ventricular systolic function is normal. The right ventricular size is normal. There is normal pulmonary artery systolic pressure.  3. The mitral valve is normal in structure. No evidence of mitral valve regurgitation. No evidence of mitral stenosis.  4. The aortic valve is normal in structure. Aortic valve regurgitation is not visualized. No aortic  stenosis is present.  5. The inferior vena  cava is normal in size with greater than 50% respiratory variability, suggesting right atrial pressure of 3 mmHg.   03/20/2020 Procedure   Placement of a subcutaneous port device. Catheter tip at the superior cavoatrial junction.   03/24/2020 -  Chemotherapy   The patient had ABVD for chemotherapy treatment.     05/16/2020 PET scan   1. Small hypermetabolic RIGHT level II lymph node is indeterminate. Activity is similar to liver activity. No comparison available. 2. Reduction in size and no significant metabolic activity of RIGHT supraclavicular lymph node ( [place] 1). 3. No evidence lymphoma chest, abdomen pelvis.  Normal spleen.  4. Intense metabolic activity localizing to LEFT paraspinal musculature in the cervical neck. Favor benign physiologic muscle musculature in the LEFT neck. Recommend clinical correlation for muscle spasm or trauma. If concern for unlikely muscular lymphoma, consider contrast CT or MRI of the neck.   06/10/2020 Echocardiogram    1. Left ventricular ejection fraction, by estimation, is 55 to 60%. The left ventricle has normal function. The left ventricle has no regional wall motion abnormalities. Left ventricular diastolic parameters were normal. The average left ventricular global longitudinal strain is -21.6 %. The global longitudinal strain is normal.  2. Right ventricular systolic function is normal. The right ventricular size is normal. Tricuspid regurgitation signal is inadequate for assessing PA pressure.  3. The mitral valve is normal in structure. Trivial mitral valve regurgitation. No evidence of mitral stenosis.  4. The aortic valve is tricuspid. Aortic valve regurgitation is not visualized. No aortic stenosis is present.       REVIEW OF SYSTEMS:   Constitutional: Denies fevers, chills or abnormal weight loss Eyes: Denies blurriness of vision Ears, nose, mouth, throat, and face: Denies mucositis or sore throat Respiratory: Denies cough, dyspnea or  wheezes Cardiovascular: Denies palpitation, chest discomfort or lower extremity swelling Gastrointestinal:  Denies nausea, heartburn or change in bowel habits Skin: Denies abnormal skin rashes Lymphatics: Denies new lymphadenopathy or easy bruising Behavioral/Psych: Mood is stable, no new changes  All other systems were reviewed with the patient and are negative.  I have reviewed the past medical history, past surgical history, social history and family history with the patient and they are unchanged from previous note.  ALLERGIES:  is allergic to amoxicillin.  MEDICATIONS:  Current Outpatient Medications  Medication Sig Dispense Refill  . acetaminophen (TYLENOL) 500 MG tablet Take 1,000 mg by mouth every 6 (six) hours as needed for moderate pain or headache.    . Carboxymethylcellul-Glycerin (LUBRICATING EYE DROPS OP) Place 1 drop into both eyes daily as needed (dry eyes).    . Cholecalciferol (VITAMIN D) 50 MCG (2000 UT) tablet Take 6,000 Units by mouth daily.    Marland Kitchen EPINEPHrine 0.3 mg/0.3 mL IJ SOAJ injection Inject 0.3 mg into the muscle as needed for anaphylaxis.    Marland Kitchen lidocaine-prilocaine (EMLA) cream Apply to affected area once (Patient taking differently: Apply 1 application topically daily as needed (port access). ) 30 g 3  . Magnesium 250 MG TABS Take 250 mg by mouth daily.    . Multiple Vitamins-Minerals (ZINC PO) Take 2 tablets by mouth daily. With quercetin    . ondansetron (ZOFRAN) 8 MG tablet Take 1 tablet (8 mg total) by mouth every 8 (eight) hours as needed. Start on the third day after chemotherapy. (Patient taking differently: Take 8 mg by mouth every 8 (eight) hours as needed for nausea or vomiting. Start on the  third day after chemotherapy.) 30 tablet 1  . OVER THE COUNTER MEDICATION Take 2 capsules by mouth at bedtime as needed (sleep). Stress Relief otc supplement    . oxymetazoline (AFRIN) 0.05 % nasal spray Place 1 spray into both nostrils 2 (two) times daily as needed  for congestion.    . polyethylene glycol (MIRALAX / GLYCOLAX) 17 g packet Take 17 g by mouth daily.    . prochlorperazine (COMPAZINE) 10 MG tablet Take 1 tablet (10 mg total) by mouth every 6 (six) hours as needed (Nausea or vomiting). 30 tablet 1  . SUPER B COMPLEX/C PO Take 1 tablet by mouth daily.    . TURMERIC PO Take 2 capsules by mouth daily.     No current facility-administered medications for this visit.   Facility-Administered Medications Ordered in Other Visits  Medication Dose Route Frequency Provider Last Rate Last Admin  . dacarbazine (DTIC) 690 mg in sodium chloride 0.9 % 250 mL chemo infusion  300 mg/m2 (Treatment Plan Recorded) Intravenous Once Mark Lark, MD 319 mL/hr at 06/16/20 1447 690 mg at 06/16/20 1447  . heparin lock flush 100 unit/mL  500 Units Intracatheter Once PRN Alvy Bimler, Ni, MD      . sodium chloride flush (NS) 0.9 % injection 10 mL  10 mL Intracatheter PRN Alvy Bimler, Ni, MD        PHYSICAL EXAMINATION: ECOG PERFORMANCE STATUS: 1 - Symptomatic but completely ambulatory  Vitals:   06/16/20 1123  BP: (!) 121/55  Pulse: 71  Resp: 18  Temp: (!) 97.4 F (36.3 C)  SpO2: 100%   Filed Weights   06/16/20 1123  Weight: 228 lb (103.4 kg)    GENERAL:alert, no distress and comfortable NEURO: alert & oriented x 3 with fluent speech, no focal motor/sensory deficits  LABORATORY DATA:  I have reviewed the data as listed    Component Value Date/Time   NA 139 06/16/2020 1117   NA 141 04/27/2013 1217   K 4.5 06/16/2020 1117   K 4.0 04/27/2013 1217   CL 108 06/16/2020 1117   CL 106 04/26/2012 1550   CO2 26 06/16/2020 1117   CO2 28 04/27/2013 1217   GLUCOSE 99 06/16/2020 1117   GLUCOSE 92 04/27/2013 1217   GLUCOSE 108 (H) 04/26/2012 1550   BUN 21 (H) 06/16/2020 1117   BUN 10.2 04/27/2013 1217   CREATININE 0.80 06/16/2020 1117   CREATININE 0.8 04/27/2013 1217   CALCIUM 9.2 06/16/2020 1117   CALCIUM 9.7 04/27/2013 1217   PROT 7.4 06/16/2020 1117   PROT  7.5 04/27/2013 1217   ALBUMIN 3.9 06/16/2020 1117   ALBUMIN 3.6 04/27/2013 1217   AST 17 06/16/2020 1117   AST 14 04/27/2013 1217   ALT 25 06/16/2020 1117   ALT 23 04/27/2013 1217   ALKPHOS 52 06/16/2020 1117   ALKPHOS 64 04/27/2013 1217   BILITOT 0.3 06/16/2020 1117   BILITOT 0.86 04/27/2013 1217   GFRNONAA >60 06/16/2020 1117   GFRAA >60 03/24/2020 0853    No results found for: SPEP, UPEP  Lab Results  Component Value Date   WBC 8.0 06/16/2020   NEUTROABS 4.6 06/16/2020   HGB 13.7 06/16/2020   HCT 40.3 06/16/2020   MCV 86.1 06/16/2020   PLT 247 06/16/2020      Chemistry      Component Value Date/Time   NA 139 06/16/2020 1117   NA 141 04/27/2013 1217   K 4.5 06/16/2020 1117   K 4.0 04/27/2013 1217   CL 108  06/16/2020 1117   CL 106 04/26/2012 1550   CO2 26 06/16/2020 1117   CO2 28 04/27/2013 1217   BUN 21 (H) 06/16/2020 1117   BUN 10.2 04/27/2013 1217   CREATININE 0.80 06/16/2020 1117   CREATININE 0.8 04/27/2013 1217      Component Value Date/Time   CALCIUM 9.2 06/16/2020 1117   CALCIUM 9.7 04/27/2013 1217   ALKPHOS 52 06/16/2020 1117   ALKPHOS 64 04/27/2013 1217   AST 17 06/16/2020 1117   AST 14 04/27/2013 1217   ALT 25 06/16/2020 1117   ALT 23 04/27/2013 1217   BILITOT 0.3 06/16/2020 1117   BILITOT 0.86 04/27/2013 1217       RADIOGRAPHIC STUDIES: I have personally reviewed the radiological images as listed and agreed with the findings in the report. ECHOCARDIOGRAM COMPLETE  Result Date: 06/10/2020    ECHOCARDIOGRAM REPORT   Patient Name:   [facility] Date of Exam: 06/10/2020 Medical Rec #:  [id number]                 Height:       77.0 in Accession #:    [id number]                Weight:       225.5 lb Date of Birth:  [birth year]                 BSA:          2.354 m Patient Age:    24 years                  BP:           119/69 mmHg Patient Gender: M                         HR:           71 bpm. Exam Location:  Outpatient Procedure: 2D  Echo, 3D Echo, Cardiac Doppler, Color Doppler and Strain Analysis Indications:    Z51.11 Encounter for antineoplastic chemotheraphy  History:        Patient has prior history of Echocardiogram examinations, most                 recent 03/18/2020. Signs/Symptoms:Fever. Leukemia.  Sonographer:    Roseanna Rainbow Referring Phys: [id number] NI O'Kean  Sonographer Comments: Global longitudinal strain was attempted. IMPRESSIONS  1. Left ventricular ejection fraction, by estimation, is 55 to 60%. The left ventricle has normal function. The left ventricle has no regional wall motion abnormalities. Left ventricular diastolic parameters were normal. The average left ventricular global longitudinal strain is -21.6 %. The global longitudinal strain is normal.  2. Right ventricular systolic function is normal. The right ventricular size is normal. Tricuspid regurgitation signal is inadequate for assessing PA pressure.  3. The mitral valve is normal in structure. Trivial mitral valve regurgitation. No evidence of mitral stenosis.  4. The aortic valve is tricuspid. Aortic valve regurgitation is not visualized. No aortic stenosis is present. Comparison(s): No prior Echocardiogram. Conclusion(s)/Recommendation(s): Normal biventricular function without evidence of hemodynamically significant valvular heart disease. FINDINGS  Left Ventricle: Left ventricular ejection fraction, by estimation, is 55 to 60%. The left ventricle has normal function. The left ventricle has no regional wall motion abnormalities. The average left ventricular global longitudinal strain is -21.6 %. The global longitudinal strain is normal. The left ventricular internal cavity size was normal in size. There is no left ventricular hypertrophy.  Left ventricular diastolic parameters were normal. Right Ventricle: The right ventricular size is normal. No increase in right ventricular wall thickness. Right ventricular systolic function is normal. Tricuspid regurgitation signal  is inadequate for assessing PA pressure. Left Atrium: Left atrial size was normal in size. Right Atrium: Right atrial size was normal in size. Pericardium: There is no evidence of pericardial effusion. Mitral Valve: The mitral valve is normal in structure. Trivial mitral valve regurgitation. No evidence of mitral valve stenosis. Tricuspid Valve: The tricuspid valve is normal in structure. Tricuspid valve regurgitation is trivial. Aortic Valve: The aortic valve is tricuspid. Aortic valve regurgitation is not visualized. No aortic stenosis is present. Pulmonic Valve: The pulmonic valve was not well visualized. Pulmonic valve regurgitation is not visualized. Aorta: The aortic root and ascending aorta are structurally normal, with no evidence of dilitation. IAS/Shunts: The atrial septum is grossly normal.  LEFT VENTRICLE PLAX 2D LVIDd:         5.10 cm      Diastology LVIDs:         3.60 cm      LV e' medial:    11.30 cm/s LV PW:         1.30 cm      LV E/e' medial:  8.3 LV IVS:        0.80 cm      LV e' lateral:   23.40 cm/s LVOT diam:     2.30 cm      LV E/e' lateral: 4.0 LV SV:         101 LV SV Index:   43           2D Longitudinal Strain LVOT Area:     4.15 cm     2D Strain GLS (A2C):   -18.4 %                             2D Strain GLS (A3C):   -22.1 %                             2D Strain GLS (A4C):   -24.1 % LV Volumes (MOD)            2D Strain GLS Avg:     -21.6 % LV vol d, MOD A2C: 151.0 ml LV vol d, MOD A4C: 143.0 ml LV vol s, MOD A2C: 62.9 ml LV vol s, MOD A4C: 55.2 ml LV SV MOD A2C:     88.1 ml LV SV MOD A4C:     143.0 ml LV SV MOD BP:      90.8 ml RIGHT VENTRICLE             IVC RV S prime:     12.20 cm/s  IVC diam: 1.80 cm TAPSE (M-mode): 1.9 cm LEFT ATRIUM             Index       RIGHT ATRIUM           Index LA diam:        3.60 cm 1.53 cm/m  RA Area:     19.10 cm LA Vol (A2C):   49.8 ml 21.16 ml/m RA Volume:   54.50 ml  23.15 ml/m LA Vol (A4C):   31.0 ml 13.17 ml/m LA Biplane Vol: 39.8 ml 16.91  ml/m  AORTIC VALVE LVOT Vmax:   117.00 cm/s LVOT Vmean:  72.700 cm/s LVOT VTI:    0.242 m  AORTA Ao Root diam: 2.80 cm Ao Asc diam:  3.20 cm MITRAL VALVE MV Area (PHT): 4.89 cm    SHUNTS MV Decel Time: 155 msec    Systemic VTI:  0.24 m MV E velocity: 93.60 cm/s  Systemic Diam: 2.30 cm MV A velocity: 50.40 cm/s MV E/A ratio:  1.86 Buford Dresser MD Electronically signed by Buford Dresser MD Signature Date/Time: 06/10/2020/11:39:09 AM    Final

## 2020-06-16 NOTE — Progress Notes (Signed)
Clarified dose of prochlorperazine to be 10 mg.  T.O. Sandi Mealy, PA/Kihanna Kamiya Ronnald Ramp, PharmD

## 2020-06-16 NOTE — Patient Instructions (Signed)

## 2020-06-16 NOTE — Assessment & Plan Note (Signed)
He denies recent flare of Crohn's disease Observe closely

## 2020-06-16 NOTE — Assessment & Plan Note (Signed)
Neuropathy is stable since recent dose adjustment We will continue with reduced dose treatment

## 2020-06-16 NOTE — Assessment & Plan Note (Signed)
His recent fatigue and headaches are likely due to side effects of treatment Observe for now

## 2020-06-30 ENCOUNTER — Inpatient Hospital Stay: Payer: 59

## 2020-06-30 ENCOUNTER — Inpatient Hospital Stay: Payer: 59 | Attending: Hematology and Oncology

## 2020-06-30 ENCOUNTER — Other Ambulatory Visit: Payer: Self-pay

## 2020-06-30 VITALS — BP 112/55 | HR 68 | Temp 98.4°F | Resp 18 | Wt 233.2 lb

## 2020-06-30 DIAGNOSIS — D509 Iron deficiency anemia, unspecified: Secondary | ICD-10-CM

## 2020-06-30 DIAGNOSIS — C8110 Nodular sclerosis classical Hodgkin lymphoma, unspecified site: Secondary | ICD-10-CM | POA: Diagnosis not present

## 2020-06-30 DIAGNOSIS — K50919 Crohn's disease, unspecified, with unspecified complications: Secondary | ICD-10-CM

## 2020-06-30 DIAGNOSIS — Z5111 Encounter for antineoplastic chemotherapy: Secondary | ICD-10-CM | POA: Insufficient documentation

## 2020-06-30 DIAGNOSIS — R590 Localized enlarged lymph nodes: Secondary | ICD-10-CM

## 2020-06-30 DIAGNOSIS — C8111 Nodular sclerosis classical Hodgkin lymphoma, lymph nodes of head, face, and neck: Secondary | ICD-10-CM

## 2020-06-30 LAB — CBC WITH DIFFERENTIAL/PLATELET
Abs Immature Granulocytes: 0.02 10*3/uL (ref 0.00–0.07)
Basophils Absolute: 0.1 10*3/uL (ref 0.0–0.1)
Basophils Relative: 1 %
Eosinophils Absolute: 0.5 10*3/uL (ref 0.0–0.5)
Eosinophils Relative: 5 %
HCT: 39.8 % (ref 39.0–52.0)
Hemoglobin: 13.5 g/dL (ref 13.0–17.0)
Immature Granulocytes: 0 %
Lymphocytes Relative: 19 %
Lymphs Abs: 1.6 10*3/uL (ref 0.7–4.0)
MCH: 29.2 pg (ref 26.0–34.0)
MCHC: 33.9 g/dL (ref 30.0–36.0)
MCV: 86 fL (ref 80.0–100.0)
Monocytes Absolute: 1.4 10*3/uL — ABNORMAL HIGH (ref 0.1–1.0)
Monocytes Relative: 17 %
Neutro Abs: 4.7 10*3/uL (ref 1.7–7.7)
Neutrophils Relative %: 58 %
Platelets: 272 10*3/uL (ref 150–400)
RBC: 4.63 MIL/uL (ref 4.22–5.81)
RDW: 18 % — ABNORMAL HIGH (ref 11.5–15.5)
WBC: 8.3 10*3/uL (ref 4.0–10.5)
nRBC: 0 % (ref 0.0–0.2)

## 2020-06-30 LAB — COMPREHENSIVE METABOLIC PANEL
ALT: 27 U/L (ref 0–44)
AST: 23 U/L (ref 15–41)
Albumin: 3.8 g/dL (ref 3.5–5.0)
Alkaline Phosphatase: 52 U/L (ref 38–126)
Anion gap: 6 (ref 5–15)
BUN: 17 mg/dL (ref 6–20)
CO2: 27 mmol/L (ref 22–32)
Calcium: 9.4 mg/dL (ref 8.9–10.3)
Chloride: 107 mmol/L (ref 98–111)
Creatinine, Ser: 0.82 mg/dL (ref 0.61–1.24)
GFR, Estimated: >60 mL/min (ref >60)
Glucose, Bld: 97 mg/dL (ref 70–99)
Potassium: 3.9 mmol/L (ref 3.5–5.1)
Sodium: 140 mmol/L (ref 135–145)
Total Bilirubin: 0.4 mg/dL (ref 0.3–1.2)
Total Protein: 7.3 g/dL (ref 6.5–8.1)

## 2020-06-30 MED ORDER — HEPARIN SOD (PORK) LOCK FLUSH 100 UNIT/ML IV SOLN
500.0000 [IU] | Freq: Once | INTRAVENOUS | Status: AC | PRN
  Administered 2020-06-30: 500 [IU]
  Filled 2020-06-30: qty 5

## 2020-06-30 MED ORDER — SODIUM CHLORIDE 0.9% FLUSH
10.0000 mL | INTRAVENOUS | Status: DC | PRN
  Administered 2020-06-30: 10 mL
  Filled 2020-06-30: qty 10

## 2020-06-30 MED ORDER — PALONOSETRON HCL INJECTION 0.25 MG/5ML
0.2500 mg | Freq: Once | INTRAVENOUS | Status: AC
  Administered 2020-06-30: 0.25 mg via INTRAVENOUS

## 2020-06-30 MED ORDER — VINBLASTINE SULFATE CHEMO INJECTION 1 MG/ML
3.0000 mg/m2 | Freq: Once | INTRAVENOUS | Status: AC
  Administered 2020-06-30: 6.9 mg via INTRAVENOUS
  Filled 2020-06-30: qty 6.9

## 2020-06-30 MED ORDER — SODIUM CHLORIDE 0.9% FLUSH
10.0000 mL | Freq: Once | INTRAVENOUS | Status: AC
  Administered 2020-06-30: 10 mL
  Filled 2020-06-30: qty 10

## 2020-06-30 MED ORDER — DEXAMETHASONE SODIUM PHOSPHATE 10 MG/ML IJ SOLN
5.0000 mg | Freq: Once | INTRAMUSCULAR | Status: AC
  Administered 2020-06-30: 5 mg via INTRAVENOUS

## 2020-06-30 MED ORDER — PALONOSETRON HCL INJECTION 0.25 MG/5ML
INTRAVENOUS | Status: AC
  Filled 2020-06-30: qty 5

## 2020-06-30 MED ORDER — DOXORUBICIN HCL CHEMO IV INJECTION 2 MG/ML
20.0000 mg/m2 | Freq: Once | INTRAVENOUS | Status: AC
  Administered 2020-06-30: 46 mg via INTRAVENOUS
  Filled 2020-06-30: qty 23

## 2020-06-30 MED ORDER — SODIUM CHLORIDE 0.9 % IV SOLN
150.0000 mg | Freq: Once | INTRAVENOUS | Status: AC
  Administered 2020-06-30: 150 mg via INTRAVENOUS
  Filled 2020-06-30: qty 150

## 2020-06-30 MED ORDER — SODIUM CHLORIDE 0.9 % IV SOLN
300.0000 mg/m2 | Freq: Once | INTRAVENOUS | Status: AC
  Administered 2020-06-30: 690 mg via INTRAVENOUS
  Filled 2020-06-30: qty 69

## 2020-06-30 MED ORDER — SODIUM CHLORIDE 0.9 % IV SOLN
Freq: Once | INTRAVENOUS | Status: AC
  Filled 2020-06-30: qty 250

## 2020-06-30 MED ORDER — DEXAMETHASONE SODIUM PHOSPHATE 10 MG/ML IJ SOLN
INTRAMUSCULAR | Status: AC
  Filled 2020-06-30: qty 1

## 2020-06-30 NOTE — Patient Instructions (Signed)
[facility] Discharge Instructions for Patients Receiving Chemotherapy  Today you received the following chemotherapy agents: doxorubicin/vinblastine/dacarbazine.  To help prevent nausea and vomiting after your treatment, we encourage you to take your nausea medication as directed.   If you develop nausea and vomiting that is not controlled by your nausea medication, call the clinic.   BELOW ARE SYMPTOMS THAT SHOULD BE REPORTED IMMEDIATELY:  *FEVER GREATER THAN 100.5 F  *CHILLS WITH OR WITHOUT FEVER  NAUSEA AND VOMITING THAT IS NOT CONTROLLED WITH YOUR NAUSEA MEDICATION  *UNUSUAL SHORTNESS OF BREATH  *UNUSUAL BRUISING OR BLEEDING  TENDERNESS IN MOUTH AND THROAT WITH OR WITHOUT PRESENCE OF ULCERS  *URINARY PROBLEMS  *BOWEL PROBLEMS  UNUSUAL RASH Items with * indicate a potential emergency and should be followed up as soon as possible.  Feel free to call the clinic should you have any questions or concerns. The clinic phone number is [phone].  Please show the CHEMO ALERT CARD at check-in to the Emergency Department and triage nurse.   

## 2020-07-07 ENCOUNTER — Telehealth: Payer: Self-pay

## 2020-07-07 ENCOUNTER — Other Ambulatory Visit: Payer: Self-pay | Admitting: Hematology and Oncology

## 2020-07-07 DIAGNOSIS — K50919 Crohn's disease, unspecified, with unspecified complications: Secondary | ICD-10-CM

## 2020-07-07 DIAGNOSIS — C8111 Nodular sclerosis classical Hodgkin lymphoma, lymph nodes of head, face, and neck: Secondary | ICD-10-CM

## 2020-07-07 MED ORDER — PREDNISONE 20 MG PO TABS: 60.0000 mg | ORAL_TABLET | Freq: Every day | ORAL | 0 refills | Status: DC

## 2020-07-07 NOTE — Telephone Encounter (Signed)
Done Prednisone for 1 week, take with food in the morning

## 2020-07-07 NOTE — Telephone Encounter (Signed)
Called regarding after hours call over the weekend with crohn's symptoms. Feeling better today and able to eat. Solid stool today. Complaining of stuffy nose. He would like prednisone Rx for flare ups of crohn's. He is using [facility] in [place], [place].

## 2020-07-07 NOTE — Telephone Encounter (Signed)
Called and given below message. He verbalized understanding.

## 2020-07-11 ENCOUNTER — Telehealth: Payer: Self-pay

## 2020-07-11 NOTE — Telephone Encounter (Signed)
Called and told appts canceled for Monday due to expected weather. Appt added to see [name] on 1/31, reviewed appts. He is aware of appt time.

## 2020-07-14 ENCOUNTER — Ambulatory Visit: Payer: 59

## 2020-07-14 ENCOUNTER — Ambulatory Visit: Payer: 59 | Admitting: Hematology and Oncology

## 2020-07-14 ENCOUNTER — Other Ambulatory Visit: Payer: 59

## 2020-07-28 ENCOUNTER — Inpatient Hospital Stay: Payer: 59

## 2020-07-28 ENCOUNTER — Other Ambulatory Visit: Payer: Self-pay

## 2020-07-28 ENCOUNTER — Inpatient Hospital Stay (HOSPITAL_BASED_OUTPATIENT_CLINIC_OR_DEPARTMENT_OTHER): Payer: 59 | Admitting: Hematology and Oncology

## 2020-07-28 ENCOUNTER — Encounter: Payer: Self-pay | Admitting: Hematology and Oncology

## 2020-07-28 DIAGNOSIS — C8111 Nodular sclerosis classical Hodgkin lymphoma, lymph nodes of head, face, and neck: Secondary | ICD-10-CM

## 2020-07-28 DIAGNOSIS — Z5111 Encounter for antineoplastic chemotherapy: Secondary | ICD-10-CM | POA: Diagnosis not present

## 2020-07-28 DIAGNOSIS — K50919 Crohn's disease, unspecified, with unspecified complications: Secondary | ICD-10-CM

## 2020-07-28 DIAGNOSIS — D509 Iron deficiency anemia, unspecified: Secondary | ICD-10-CM

## 2020-07-28 DIAGNOSIS — R110 Nausea: Secondary | ICD-10-CM

## 2020-07-28 LAB — CBC WITH DIFFERENTIAL (CANCER CENTER ONLY)
Abs Immature Granulocytes: 0.01 10*3/uL (ref 0.00–0.07)
Basophils Absolute: 0.1 10*3/uL (ref 0.0–0.1)
Basophils Relative: 1 %
Eosinophils Absolute: 0.3 10*3/uL (ref 0.0–0.5)
Eosinophils Relative: 4 %
HCT: 41.5 % (ref 39.0–52.0)
Hemoglobin: 13.6 g/dL (ref 13.0–17.0)
Immature Granulocytes: 0 %
Lymphocytes Relative: 20 %
Lymphs Abs: 1.5 10*3/uL (ref 0.7–4.0)
MCH: 29.6 pg (ref 26.0–34.0)
MCHC: 32.8 g/dL (ref 30.0–36.0)
MCV: 90.4 fL (ref 80.0–100.0)
Monocytes Absolute: 1.4 10*3/uL — ABNORMAL HIGH (ref 0.1–1.0)
Monocytes Relative: 18 %
Neutro Abs: 4.3 10*3/uL (ref 1.7–7.7)
Neutrophils Relative %: 57 %
Platelet Count: 229 10*3/uL (ref 150–400)
RBC: 4.59 MIL/uL (ref 4.22–5.81)
RDW: 15.1 % (ref 11.5–15.5)
WBC Count: 7.7 10*3/uL (ref 4.0–10.5)
nRBC: 0 % (ref 0.0–0.2)

## 2020-07-28 LAB — CMP (CANCER CENTER ONLY)
ALT: 27 U/L (ref 0–44)
AST: 25 U/L (ref 15–41)
Albumin: 4 g/dL (ref 3.5–5.0)
Alkaline Phosphatase: 53 U/L (ref 38–126)
Anion gap: 4 — ABNORMAL LOW (ref 5–15)
BUN: 19 mg/dL (ref 6–20)
CO2: 27 mmol/L (ref 22–32)
Calcium: 9.4 mg/dL (ref 8.9–10.3)
Chloride: 108 mmol/L (ref 98–111)
Creatinine: 1.11 mg/dL (ref 0.61–1.24)
GFR, Estimated: >60 mL/min (ref >60)
Glucose, Bld: 104 mg/dL — ABNORMAL HIGH (ref 70–99)
Potassium: 4.1 mmol/L (ref 3.5–5.1)
Sodium: 139 mmol/L (ref 135–145)
Total Bilirubin: 0.5 mg/dL (ref 0.3–1.2)
Total Protein: 7.3 g/dL (ref 6.5–8.1)

## 2020-07-28 MED ORDER — SODIUM CHLORIDE 0.9% FLUSH
10.0000 mL | Freq: Once | INTRAVENOUS | Status: AC
  Administered 2020-07-28: 10 mL
  Filled 2020-07-28: qty 10

## 2020-07-28 MED ORDER — PALONOSETRON HCL INJECTION 0.25 MG/5ML
INTRAVENOUS | Status: AC
  Filled 2020-07-28: qty 5

## 2020-07-28 MED ORDER — DEXAMETHASONE SODIUM PHOSPHATE 10 MG/ML IJ SOLN
5.0000 mg | Freq: Once | INTRAMUSCULAR | Status: AC
  Administered 2020-07-28: 5 mg via INTRAVENOUS

## 2020-07-28 MED ORDER — PALONOSETRON HCL INJECTION 0.25 MG/5ML
0.2500 mg | Freq: Once | INTRAVENOUS | Status: AC
  Administered 2020-07-28: 0.25 mg via INTRAVENOUS

## 2020-07-28 MED ORDER — DEXAMETHASONE SODIUM PHOSPHATE 10 MG/ML IJ SOLN
INTRAMUSCULAR | Status: AC
  Filled 2020-07-28: qty 1

## 2020-07-28 MED ORDER — DACARBAZINE 200 MG IV SOLR
300.0000 mg/m2 | Freq: Once | INTRAVENOUS | Status: AC
  Administered 2020-07-28: 690 mg via INTRAVENOUS
  Filled 2020-07-28: qty 69

## 2020-07-28 MED ORDER — SODIUM CHLORIDE 0.9 % IV SOLN
Freq: Once | INTRAVENOUS | Status: AC
  Filled 2020-07-28: qty 250

## 2020-07-28 MED ORDER — DOXORUBICIN HCL CHEMO IV INJECTION 2 MG/ML
20.0000 mg/m2 | Freq: Once | INTRAVENOUS | Status: AC
  Administered 2020-07-28: 46 mg via INTRAVENOUS
  Filled 2020-07-28: qty 23

## 2020-07-28 MED ORDER — VINBLASTINE SULFATE CHEMO INJECTION 1 MG/ML
3.0000 mg/m2 | Freq: Once | INTRAVENOUS | Status: AC
  Administered 2020-07-28: 6.9 mg via INTRAVENOUS
  Filled 2020-07-28: qty 6.9

## 2020-07-28 MED ORDER — SODIUM CHLORIDE 0.9 % IV SOLN
150.0000 mg | Freq: Once | INTRAVENOUS | Status: AC
  Administered 2020-07-28: 150 mg via INTRAVENOUS
  Filled 2020-07-28: qty 5
  Filled 2020-07-28: qty 150

## 2020-07-28 MED ORDER — SODIUM CHLORIDE 0.9% FLUSH
10.0000 mL | INTRAVENOUS | Status: DC | PRN
  Administered 2020-07-28: 10 mL
  Filled 2020-07-28: qty 10

## 2020-07-28 MED ORDER — ALTEPLASE 2 MG IJ SOLR
2.0000 mg | Freq: Once | INTRAMUSCULAR | Status: AC
  Administered 2020-07-28: 2 mg
  Filled 2020-07-28: qty 2

## 2020-07-28 MED ORDER — ALTEPLASE 2 MG IJ SOLR
INTRAMUSCULAR | Status: AC
  Filled 2020-07-28: qty 2

## 2020-07-28 MED ORDER — PREDNISONE 20 MG PO TABS: 40.0000 mg | ORAL_TABLET | Freq: Every day | ORAL | 1 refills | Status: DC

## 2020-07-28 MED ORDER — HEPARIN SOD (PORK) LOCK FLUSH 100 UNIT/ML IV SOLN
500.0000 [IU] | Freq: Once | INTRAVENOUS | Status: AC | PRN
  Administered 2020-07-28: 500 [IU]
  Filled 2020-07-28: qty 5

## 2020-07-28 NOTE — Progress Notes (Signed)
[facility] OFFICE PROGRESS NOTE  Patient Care Team: [name], MD as PCP - General (Family Medicine) [name], MD as Attending Physician (Family Medicine) [name], MD (Inactive) (Gastroenterology)  ASSESSMENT & PLAN:  Hodgkin lymphoma [facility]) He missed 1 dose recently due to inclement weather We will resume treatment today We will order repeat imaging study at the conclusion of cycle 6 of therapy  Crohn disease ([place]) He had recent intermittent flare of Crohn's disease I recommend low-dose prednisone therapy for a week or so He is in agreement  Nausea without vomiting He has occasional rare nausea He will continue antiemetics as needed   No orders of the defined types were placed in this encounter.   All questions were answered. The patient knows to call the clinic with any problems, questions or concerns. The total time spent in the appointment was 20 minutes encounter with patients including review of chart and various tests results, discussions about plan of care and coordination of care plan   [name], MD 07/28/2020 12:17 PM  INTERVAL HISTORY: Please see below for problem oriented charting. He is seen prior to cycle 5 of treatment Since last time I saw him, he had one flare of Crohn's, alleviated within 3 to 4 days of therapy He has occasional nausea His appetite is good He has occasional palpable lymphadenopathy or small skin lesion around his neck that comes and goes  SUMMARY OF ONCOLOGIC HISTORY: Oncology History  Hodgkin lymphoma ([place])  03/10/2020 Pathology Results   A. LYMPH NODE, RIGHT SUBMANDIBULAR, BIOPSY:  -  Classic Hodgkin lymphoma  -  See comment   COMMENT:   The submitted lymph nodes are effaced by a vaguely nodular lymphoid proliferation separated by bands of fibrosis with admixed inflammatory cells including neutrophils and eosinophils. The lymphoid proliferation  is composed predominantly of small mature lymphocytes  with scattered large atypical cells. The atypical cells have mono and multi-lobated nuclei and prominent nucleoli characteristic of Reed-Sternberg cells.  Lacunar cells are also present.   The neoplastic cells are CD30, CD15 (subset), Pax-5 (dim) and CD20 positive by immunohistochemistry. EBV by in-situ hybridization is positive in the Reed-Sternberg cells. CD3 highlights the background  T-cells.  EBV in-situ hybridization is negative.   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    03/14/2020 Initial Diagnosis   Hodgkin lymphoma ([place])   03/14/2020 Cancer Staging   Staging form: Hodgkin and Non-Hodgkin Lymphoma, AJCC 8th Edition - Clinical stage from 03/14/2020: Stage I (Hodgkin lymphoma, B - Symptoms) - Signed by [name], MD on 03/14/2020   03/18/2020 Echocardiogram   1. Left ventricular ejection fraction, by estimation, is 55 to 60%. The left ventricle has normal function. The left ventricle has no regional wall motion abnormalities. Left ventricular diastolic parameters were normal. The average left ventricular global longitudinal strain is -15.9 %. The global longitudinal strain is normal.  2. Right ventricular systolic function is normal. The right ventricular size is normal. There is normal pulmonary artery systolic pressure.  3. The mitral valve is normal in structure. No evidence of mitral valve regurgitation. No evidence of mitral stenosis.  4. The aortic valve is normal in structure. Aortic valve regurgitation is not visualized. No aortic stenosis is present.  5. The inferior vena cava is normal in size with greater than 50% respiratory variability, suggesting right atrial pressure of 3 mmHg.   03/20/2020 Procedure   Placement of a subcutaneous  port device. Catheter tip at the superior cavoatrial junction.   03/24/2020 -  Chemotherapy   The patient had ABVD for chemotherapy  treatment.     05/16/2020 PET scan   1. Small hypermetabolic RIGHT level II lymph node is indeterminate. Activity is similar to liver activity. No comparison available. 2. Reduction in size and no significant metabolic activity of RIGHT supraclavicular lymph node ( [place] 1). 3. No evidence lymphoma chest, abdomen pelvis.  Normal spleen.  4. Intense metabolic activity localizing to LEFT paraspinal musculature in the cervical neck. Favor benign physiologic muscle musculature in the LEFT neck. Recommend clinical correlation for muscle spasm or trauma. If concern for unlikely muscular lymphoma, consider contrast CT or MRI of the neck.   06/10/2020 Echocardiogram    1. Left ventricular ejection fraction, by estimation, is 55 to 60%. The left ventricle has normal function. The left ventricle has no regional wall motion abnormalities. Left ventricular diastolic parameters were normal. The average left ventricular global longitudinal strain is -21.6 %. The global longitudinal strain is normal.  2. Right ventricular systolic function is normal. The right ventricular size is normal. Tricuspid regurgitation signal is inadequate for assessing PA pressure.  3. The mitral valve is normal in structure. Trivial mitral valve regurgitation. No evidence of mitral stenosis.  4. The aortic valve is tricuspid. Aortic valve regurgitation is not visualized. No aortic stenosis is present.       REVIEW OF SYSTEMS:   Constitutional: Denies fevers, chills or abnormal weight loss Eyes: Denies blurriness of vision Ears, nose, mouth, throat, and face: Denies mucositis or sore throat Respiratory: Denies cough, dyspnea or wheezes Cardiovascular: Denies palpitation, chest discomfort or lower extremity swelling Skin: Denies abnormal skin rashes Lymphatics: Denies new lymphadenopathy or easy bruising Neurological:Denies numbness, tingling or new weaknesses Behavioral/Psych: Mood is stable, no new changes  All other  systems were reviewed with the patient and are negative.  I have reviewed the past medical history, past surgical history, social history and family history with the patient and they are unchanged from previous note.  ALLERGIES:  is allergic to amoxicillin.  MEDICATIONS:  Current Outpatient Medications  Medication Sig Dispense Refill  . acetaminophen (TYLENOL) 500 MG tablet Take 1,000 mg by mouth every 6 (six) hours as needed for moderate pain or headache.    . Carboxymethylcellul-Glycerin (LUBRICATING EYE DROPS OP) Place 1 drop into both eyes daily as needed (dry eyes).    . Cholecalciferol (VITAMIN D) 50 MCG (2000 UT) tablet Take 6,000 Units by mouth daily.    Marland Kitchen EPINEPHrine 0.3 mg/0.3 mL IJ SOAJ injection Inject 0.3 mg into the muscle as needed for anaphylaxis.    Marland Kitchen lidocaine-prilocaine (EMLA) cream Apply to affected area once (Patient taking differently: Apply 1 application topically daily as needed (port access). ) 30 g 3  . Magnesium 250 MG TABS Take 250 mg by mouth daily.    . Multiple Vitamins-Minerals (ZINC PO) Take 2 tablets by mouth daily. With quercetin    . ondansetron (ZOFRAN) 8 MG tablet Take 1 tablet (8 mg total) by mouth every 8 (eight) hours as needed. Start on the third day after chemotherapy. (Patient taking differently: Take 8 mg by mouth every 8 (eight) hours as needed for nausea or vomiting. Start on the third day after chemotherapy.) 30 tablet 1  . OVER THE COUNTER MEDICATION Take 2 capsules by mouth at bedtime as needed (sleep). Stress Relief otc supplement    . oxymetazoline (AFRIN) 0.05 % nasal spray Place 1 spray  into both nostrils 2 (two) times daily as needed for congestion.    . polyethylene glycol (MIRALAX / GLYCOLAX) 17 g packet Take 17 g by mouth daily.    . predniSONE (DELTASONE) 20 MG tablet Take 2 tablets (40 mg total) by mouth daily with breakfast. 28 tablet 1  . prochlorperazine (COMPAZINE) 10 MG tablet Take 1 tablet (10 mg total) by mouth every 6 (six)  hours as needed (Nausea or vomiting). 30 tablet 1  . SUPER B COMPLEX/C PO Take 1 tablet by mouth daily.    . TURMERIC PO Take 2 capsules by mouth daily.     No current facility-administered medications for this visit.   Facility-Administered Medications Ordered in Other Visits  Medication Dose Route Frequency Provider Last Rate Last Admin  . dacarbazine (DTIC) 690 mg in sodium chloride 0.9 % 250 mL chemo infusion  300 mg/m2 (Treatment Plan Recorded) Intravenous Once Alvy Bimler, Ni, MD      . DOXOrubicin (ADRIAMYCIN) chemo injection 46 mg  20 mg/m2 (Treatment Plan Recorded) Intravenous Once Alvy Bimler, Ni, MD      . heparin lock flush 100 unit/mL  500 Units Intracatheter Once PRN Alvy Bimler, Ni, MD      . sodium chloride flush (NS) 0.9 % injection 10 mL  10 mL Intracatheter PRN Gorsuch, Ni, MD      . vinBLAStine (VELBAN) 6.9 mg in sodium chloride 0.9 % 50 mL chemo infusion  3 mg/m2 (Treatment Plan Recorded) Intravenous Once Alvy Bimler, Ni, MD        PHYSICAL EXAMINATION: ECOG PERFORMANCE STATUS: 1 - Symptomatic but completely ambulatory  Vitals:   07/28/20 1028  BP: (!) 128/46  Pulse: 86  Resp: 18  Temp: 98.1 F (36.7 C)  SpO2: 100%   Filed Weights   07/28/20 1028  Weight: 229 lb 6.4 oz (104.1 kg)    GENERAL:alert, no distress and comfortable SKIN: skin color, texture, turgor are normal, no rashes or significant lesions EYES: normal, Conjunctiva are pink and non-injected, sclera clear OROPHARYNX:no exudate, no erythema and lips, buccal mucosa, and tongue normal  NECK: supple, thyroid normal size, non-tender, without nodularity LYMPH:  no palpable lymphadenopathy in the cervical, axillary or inguinal LUNGS: clear to auscultation and percussion with normal breathing effort HEART: regular rate & rhythm and no murmurs and no lower extremity edema ABDOMEN:abdomen soft, non-tender and normal bowel sounds Musculoskeletal:no cyanosis of digits and no clubbing  NEURO: alert & oriented x 3 with  fluent speech, no focal motor/sensory deficits  LABORATORY DATA:  I have reviewed the data as listed    Component Value Date/Time   NA 139 07/28/2020 0852   NA 141 04/27/2013 1217   K 4.1 07/28/2020 0852   K 4.0 04/27/2013 1217   CL 108 07/28/2020 0852   CL 106 04/26/2012 1550   CO2 27 07/28/2020 0852   CO2 28 04/27/2013 1217   GLUCOSE 104 (H) 07/28/2020 0852   GLUCOSE 92 04/27/2013 1217   GLUCOSE 108 (H) 04/26/2012 1550   BUN 19 07/28/2020 0852   BUN 10.2 04/27/2013 1217   CREATININE 1.11 07/28/2020 0852   CREATININE 0.8 04/27/2013 1217   CALCIUM 9.4 07/28/2020 0852   CALCIUM 9.7 04/27/2013 1217   PROT 7.3 07/28/2020 0852   PROT 7.5 04/27/2013 1217   ALBUMIN 4.0 07/28/2020 0852   ALBUMIN 3.6 04/27/2013 1217   AST 25 07/28/2020 0852   AST 14 04/27/2013 1217   ALT 27 07/28/2020 0852   ALT 23 04/27/2013 1217   ALKPHOS 53 07/28/2020  0852   ALKPHOS 64 04/27/2013 1217   BILITOT 0.5 07/28/2020 0852   BILITOT 0.86 04/27/2013 1217   GFRNONAA >60 07/28/2020 0852   GFRAA >60 03/24/2020 0853    No results found for: SPEP, UPEP  Lab Results  Component Value Date   WBC 7.7 07/28/2020   NEUTROABS 4.3 07/28/2020   HGB 13.6 07/28/2020   HCT 41.5 07/28/2020   MCV 90.4 07/28/2020   PLT 229 07/28/2020      Chemistry      Component Value Date/Time   NA 139 07/28/2020 0852   NA 141 04/27/2013 1217   K 4.1 07/28/2020 0852   K 4.0 04/27/2013 1217   CL 108 07/28/2020 0852   CL 106 04/26/2012 1550   CO2 27 07/28/2020 0852   CO2 28 04/27/2013 1217   BUN 19 07/28/2020 0852   BUN 10.2 04/27/2013 1217   CREATININE 1.11 07/28/2020 0852   CREATININE 0.8 04/27/2013 1217      Component Value Date/Time   CALCIUM 9.4 07/28/2020 0852   CALCIUM 9.7 04/27/2013 1217   ALKPHOS 53 07/28/2020 0852   ALKPHOS 64 04/27/2013 1217   AST 25 07/28/2020 0852   AST 14 04/27/2013 1217   ALT 27 07/28/2020 0852   ALT 23 04/27/2013 1217   BILITOT 0.5 07/28/2020 0852   BILITOT 0.86 04/27/2013  1217

## 2020-07-28 NOTE — Assessment & Plan Note (Signed)
He has occasional rare nausea He will continue antiemetics as needed

## 2020-07-28 NOTE — Patient Instructions (Signed)
[facility] Discharge Instructions for Patients Receiving Chemotherapy  Today you received the following chemotherapy agents Adriamycin, Velban and DTIC  To help prevent nausea and vomiting after your treatment, we encourage you to take your nausea medication as directed.    If you develop nausea and vomiting that is not controlled by your nausea medication, call the clinic.   BELOW ARE SYMPTOMS THAT SHOULD BE REPORTED IMMEDIATELY:  *FEVER GREATER THAN 100.5 F  *CHILLS WITH OR WITHOUT FEVER  NAUSEA AND VOMITING THAT IS NOT CONTROLLED WITH YOUR NAUSEA MEDICATION  *UNUSUAL SHORTNESS OF BREATH  *UNUSUAL BRUISING OR BLEEDING  TENDERNESS IN MOUTH AND THROAT WITH OR WITHOUT PRESENCE OF ULCERS  *URINARY PROBLEMS  *BOWEL PROBLEMS  UNUSUAL RASH Items with * indicate a potential emergency and should be followed up as soon as possible.  Feel free to call the clinic should you have any questions or concerns. The clinic phone number is [phone].  Please show the [facility] at check-in to the Emergency Department and triage nurse.

## 2020-07-28 NOTE — Assessment & Plan Note (Signed)
He missed 1 dose recently due to inclement weather We will resume treatment today We will order repeat imaging study at the conclusion of cycle 6 of therapy

## 2020-07-28 NOTE — Patient Instructions (Signed)
Implanted [facility] Guide An implanted port is a device that is placed under the skin. It is usually placed in the chest. The device can be used to give IV medicine, to take blood, or for dialysis. You may have an implanted port if:  You need IV medicine that would be irritating to the small veins in your hands or arms.  You need IV medicines, such as antibiotics, for a long period of time.  You need IV nutrition for a long period of time.  You need dialysis. When you have a port, your health care provider can choose to use the port instead of veins in your arms for these procedures. You may have fewer limitations when using a port than you would if you used other types of long-term IVs, and you will likely be able to return to normal activities after your incision heals. An implanted port has two main parts:  Reservoir. The reservoir is the part where a needle is inserted to give medicines or draw blood. The reservoir is round. After it is placed, it appears as a small, raised area under your skin.  Catheter. The catheter is a thin, flexible tube that connects the reservoir to a vein. Medicine that is inserted into the reservoir goes into the catheter and then into the vein. How is my port accessed? To access your port:  A numbing cream may be placed on the skin over the port site.  Your health care provider will put on a mask and sterile gloves.  The skin over your port will be cleaned carefully with a germ-killing soap and allowed to dry.  Your health care provider will gently pinch the port and insert a needle into it.  Your health care provider will check for a blood return to make sure the port is in the vein and is not clogged.  If your port needs to remain accessed to get medicine continuously (constant infusion), your health care provider will place a clear bandage (dressing) over the needle site. The dressing and needle will need to be changed every week, or as told by your  health care provider. What is flushing? Flushing helps keep the port from getting clogged. Follow instructions from your health care provider about how and when to flush the port. Ports are usually flushed with saline solution or a medicine called heparin. The need for flushing will depend on how the port is used:  If the port is only used from time to time to give medicines or draw blood, the port may need to be flushed: ? Before and after medicines have been given. ? Before and after blood has been drawn. ? As part of routine maintenance. Flushing may be recommended every 4-6 weeks.  If a constant infusion is running, the port may not need to be flushed.  Throw away any syringes in a disposal container that is meant for sharp items (sharps container). You can buy a sharps container from a pharmacy, or you can make one by using an empty hard plastic bottle with a cover. How long will my port stay implanted? The port can stay in for as long as your health care provider thinks it is needed. When it is time for the port to come out, a surgery will be done to remove it. The surgery will be similar to the procedure that was done to put the port in. Follow these instructions at home:  Flush your port as told by your health care  provider.  If you need an infusion over several days, follow instructions from your health care provider about how to take care of your port site. Make sure you: ? Wash your hands with soap and water before you change your dressing. If soap and water are not available, use alcohol-based hand sanitizer. ? Change your dressing as told by your health care provider. ? Place any used dressings or infusion bags into a plastic bag. Throw that bag in the trash. ? Keep the dressing that covers the needle clean and dry. Do not get it wet. ? Do not use scissors or sharp objects near the tube. ? Keep the tube clamped, unless it is being used.  Check your port site every day for signs  of infection. Check for: ? Redness, swelling, or pain. ? Fluid or blood. ? Pus or a bad smell.  Protect the skin around the port site. ? Avoid wearing bra straps that rub or irritate the site. ? Protect the skin around your port from seat belts. Place a soft pad over your chest if needed.  Bathe or shower as told by your health care provider. The site may get wet as long as you are not actively receiving an infusion.  Return to your normal activities as told by your health care provider. Ask your health care provider what activities are safe for you.  Carry a medical alert card or wear a medical alert bracelet at all times. This will let health care providers know that you have an implanted port in case of an emergency.   Get help right away if:  You have redness, swelling, or pain at the port site.  You have fluid or blood coming from your port site.  You have pus or a bad smell coming from the port site.  You have a fever. Summary  Implanted ports are usually placed in the chest for long-term IV access.  Follow instructions from your health care provider about flushing the port and changing bandages (dressings).  Take care of the area around your port by avoiding clothing that puts pressure on the area, and by watching for signs of infection.  Protect the skin around your port from seat belts. Place a soft pad over your chest if needed.  Get help right away if you have a fever or you have redness, swelling, pain, drainage, or a bad smell at the port site. This information is not intended to replace advice given to you by your health care provider. Make sure you discuss any questions you have with your health care provider. Document Revised: 10/29/2019 Document Reviewed: 10/29/2019 Elsevier Patient Education  [place].

## 2020-07-28 NOTE — Assessment & Plan Note (Signed)
He had recent intermittent flare of Crohn's disease I recommend low-dose prednisone therapy for a week or so He is in agreement

## 2020-07-28 NOTE — Progress Notes (Signed)
CATHFLO given by Shawnee Knapp RN at [phone]. Patient sent back to lab for blood drawl.

## 2020-08-11 ENCOUNTER — Encounter: Payer: Self-pay | Admitting: Hematology and Oncology

## 2020-08-11 ENCOUNTER — Inpatient Hospital Stay (HOSPITAL_BASED_OUTPATIENT_CLINIC_OR_DEPARTMENT_OTHER): Payer: 59 | Admitting: Hematology and Oncology

## 2020-08-11 ENCOUNTER — Other Ambulatory Visit: Payer: Self-pay

## 2020-08-11 ENCOUNTER — Inpatient Hospital Stay: Payer: 59

## 2020-08-11 ENCOUNTER — Inpatient Hospital Stay: Payer: 59 | Attending: Hematology and Oncology

## 2020-08-11 DIAGNOSIS — R590 Localized enlarged lymph nodes: Secondary | ICD-10-CM

## 2020-08-11 DIAGNOSIS — C8190 Hodgkin lymphoma, unspecified, unspecified site: Secondary | ICD-10-CM | POA: Insufficient documentation

## 2020-08-11 DIAGNOSIS — C8111 Nodular sclerosis classical Hodgkin lymphoma, lymph nodes of head, face, and neck: Secondary | ICD-10-CM | POA: Diagnosis not present

## 2020-08-11 DIAGNOSIS — K50919 Crohn's disease, unspecified, with unspecified complications: Secondary | ICD-10-CM

## 2020-08-11 DIAGNOSIS — Z5111 Encounter for antineoplastic chemotherapy: Secondary | ICD-10-CM | POA: Insufficient documentation

## 2020-08-11 DIAGNOSIS — D509 Iron deficiency anemia, unspecified: Secondary | ICD-10-CM

## 2020-08-11 LAB — COMPREHENSIVE METABOLIC PANEL
ALT: 29 U/L (ref 0–44)
AST: 18 U/L (ref 15–41)
Albumin: 3.9 g/dL (ref 3.5–5.0)
Alkaline Phosphatase: 45 U/L (ref 38–126)
Anion gap: 6 (ref 5–15)
BUN: 18 mg/dL (ref 6–20)
CO2: 24 mmol/L (ref 22–32)
Calcium: 8.8 mg/dL — ABNORMAL LOW (ref 8.9–10.3)
Chloride: 108 mmol/L (ref 98–111)
Creatinine, Ser: 0.86 mg/dL (ref 0.61–1.24)
GFR, Estimated: >60 mL/min (ref >60)
Glucose, Bld: 103 mg/dL — ABNORMAL HIGH (ref 70–99)
Potassium: 4.3 mmol/L (ref 3.5–5.1)
Sodium: 138 mmol/L (ref 135–145)
Total Bilirubin: 0.3 mg/dL (ref 0.3–1.2)
Total Protein: 6.9 g/dL (ref 6.5–8.1)

## 2020-08-11 LAB — CBC WITH DIFFERENTIAL/PLATELET
Abs Immature Granulocytes: 0.02 10*3/uL (ref 0.00–0.07)
Basophils Absolute: 0.1 10*3/uL (ref 0.0–0.1)
Basophils Relative: 1 %
Eosinophils Absolute: 0.4 10*3/uL (ref 0.0–0.5)
Eosinophils Relative: 5 %
HCT: 41.1 % (ref 39.0–52.0)
Hemoglobin: 13.9 g/dL (ref 13.0–17.0)
Immature Granulocytes: 0 %
Lymphocytes Relative: 15 %
Lymphs Abs: 1.2 10*3/uL (ref 0.7–4.0)
MCH: 30.6 pg (ref 26.0–34.0)
MCHC: 33.8 g/dL (ref 30.0–36.0)
MCV: 90.5 fL (ref 80.0–100.0)
Monocytes Absolute: 1.2 10*3/uL — ABNORMAL HIGH (ref 0.1–1.0)
Monocytes Relative: 15 %
Neutro Abs: 5.3 10*3/uL (ref 1.7–7.7)
Neutrophils Relative %: 64 %
Platelets: 243 10*3/uL (ref 150–400)
RBC: 4.54 MIL/uL (ref 4.22–5.81)
RDW: 14.9 % (ref 11.5–15.5)
WBC: 8.2 10*3/uL (ref 4.0–10.5)
nRBC: 0 % (ref 0.0–0.2)

## 2020-08-11 MED ORDER — DEXAMETHASONE SODIUM PHOSPHATE 10 MG/ML IJ SOLN
INTRAMUSCULAR | Status: AC
  Filled 2020-08-11: qty 1

## 2020-08-11 MED ORDER — SODIUM CHLORIDE 0.9 % IV SOLN
Freq: Once | INTRAVENOUS | Status: AC
  Filled 2020-08-11: qty 250

## 2020-08-11 MED ORDER — VINBLASTINE SULFATE CHEMO INJECTION 1 MG/ML
3.0000 mg/m2 | Freq: Once | INTRAVENOUS | Status: AC
  Administered 2020-08-11: 6.9 mg via INTRAVENOUS
  Filled 2020-08-11: qty 6.9

## 2020-08-11 MED ORDER — SODIUM CHLORIDE 0.9% FLUSH
10.0000 mL | Freq: Once | INTRAVENOUS | Status: AC
  Administered 2020-08-11: 10 mL
  Filled 2020-08-11: qty 10

## 2020-08-11 MED ORDER — PALONOSETRON HCL INJECTION 0.25 MG/5ML
INTRAVENOUS | Status: AC
  Filled 2020-08-11: qty 5

## 2020-08-11 MED ORDER — HEPARIN SOD (PORK) LOCK FLUSH 100 UNIT/ML IV SOLN
500.0000 [IU] | Freq: Once | INTRAVENOUS | Status: AC | PRN
  Administered 2020-08-11: 500 [IU]
  Filled 2020-08-11: qty 5

## 2020-08-11 MED ORDER — DEXAMETHASONE SODIUM PHOSPHATE 10 MG/ML IJ SOLN
5.0000 mg | Freq: Once | INTRAMUSCULAR | Status: AC
  Administered 2020-08-11: 5 mg via INTRAVENOUS

## 2020-08-11 MED ORDER — DOXORUBICIN HCL CHEMO IV INJECTION 2 MG/ML
20.0000 mg/m2 | Freq: Once | INTRAVENOUS | Status: AC
  Administered 2020-08-11: 46 mg via INTRAVENOUS
  Filled 2020-08-11: qty 23

## 2020-08-11 MED ORDER — SODIUM CHLORIDE 0.9 % IV SOLN
300.0000 mg/m2 | Freq: Once | INTRAVENOUS | Status: AC
  Administered 2020-08-11: 690 mg via INTRAVENOUS
  Filled 2020-08-11: qty 69

## 2020-08-11 MED ORDER — SODIUM CHLORIDE 0.9% FLUSH
10.0000 mL | INTRAVENOUS | Status: DC | PRN
  Administered 2020-08-11: 10 mL
  Filled 2020-08-11: qty 10

## 2020-08-11 MED ORDER — FOSAPREPITANT DIMEGLUMINE INJECTION 150 MG
150.0000 mg | Freq: Once | INTRAVENOUS | Status: AC
  Administered 2020-08-11: 150 mg via INTRAVENOUS
  Filled 2020-08-11: qty 150

## 2020-08-11 MED ORDER — PALONOSETRON HCL INJECTION 0.25 MG/5ML
0.2500 mg | Freq: Once | INTRAVENOUS | Status: AC
  Administered 2020-08-11: 0.25 mg via INTRAVENOUS

## 2020-08-11 NOTE — Patient Instructions (Signed)
[facility] Guide An implanted port is a device that is placed under the skin. It is usually placed in the chest. The device can be used to give IV medicine, to take blood, or for dialysis. You may have an implanted port if:  You need IV medicine that would be irritating to the small veins in your hands or arms.  You need IV medicines, such as antibiotics, for a long period of time.  You need IV nutrition for a long period of time.  You need dialysis. When you have a port, your health care provider can choose to use the port instead of veins in your arms for these procedures. You may have fewer limitations when using a port than you would if you used other types of long-term IVs, and you will likely be able to return to normal activities after your incision heals. An implanted port has two main parts:  Reservoir. The reservoir is the part where a needle is inserted to give medicines or draw blood. The reservoir is round. After it is placed, it appears as a small, raised area under your skin.  Catheter. The catheter is a thin, flexible tube that connects the reservoir to a vein. Medicine that is inserted into the reservoir goes into the catheter and then into the vein. How is my port accessed? To access your port:  A numbing cream may be placed on the skin over the port site.  Your health care provider will put on a mask and sterile gloves.  The skin over your port will be cleaned carefully with a germ-killing soap and allowed to dry.  Your health care provider will gently pinch the port and insert a needle into it.  Your health care provider will check for a blood return to make sure the port is in the vein and is not clogged.  If your port needs to remain accessed to get medicine continuously (constant infusion), your health care provider will place a clear bandage (dressing) over the needle site. The dressing and needle will need to be changed every week, or as told by your  health care provider. What is flushing? Flushing helps keep the port from getting clogged. Follow instructions from your health care provider about how and when to flush the port. Ports are usually flushed with saline solution or a medicine called heparin. The need for flushing will depend on how the port is used:  If the port is only used from time to time to give medicines or draw blood, the port may need to be flushed: ? Before and after medicines have been given. ? Before and after blood has been drawn. ? As part of routine maintenance. Flushing may be recommended every 4-6 weeks.  If a constant infusion is running, the port may not need to be flushed.  Throw away any syringes in a disposal container that is meant for sharp items (sharps container). You can buy a sharps container from a pharmacy, or you can make one by using an empty hard plastic bottle with a cover. How long will my port stay implanted? The port can stay in for as long as your health care provider thinks it is needed. When it is time for the port to come out, a surgery will be done to remove it. The surgery will be similar to the procedure that was done to put the port in. Follow these instructions at home:  Flush your port as told by your health care  provider.  If you need an infusion over several days, follow instructions from your health care provider about how to take care of your port site. Make sure you: ? Wash your hands with soap and water before you change your dressing. If soap and water are not available, use alcohol-based hand sanitizer. ? Change your dressing as told by your health care provider. ? Place any used dressings or infusion bags into a plastic bag. Throw that bag in the trash. ? Keep the dressing that covers the needle clean and dry. Do not get it wet. ? Do not use scissors or sharp objects near the tube. ? Keep the tube clamped, unless it is being used.  Check your port site every day for signs  of infection. Check for: ? Redness, swelling, or pain. ? Fluid or blood. ? Pus or a bad smell.  Protect the skin around the port site. ? Avoid wearing bra straps that rub or irritate the site. ? Protect the skin around your port from seat belts. Place a soft pad over your chest if needed.  Bathe or shower as told by your health care provider. The site may get wet as long as you are not actively receiving an infusion.  Return to your normal activities as told by your health care provider. Ask your health care provider what activities are safe for you.  Carry a medical alert card or wear a medical alert bracelet at all times. This will let health care providers know that you have an implanted port in case of an emergency.   Get help right away if:  You have redness, swelling, or pain at the port site.  You have fluid or blood coming from your port site.  You have pus or a bad smell coming from the port site.  You have a fever. Summary  Implanted ports are usually placed in the chest for long-term IV access.  Follow instructions from your health care provider about flushing the port and changing bandages (dressings).  Take care of the area around your port by avoiding clothing that puts pressure on the area, and by watching for signs of infection.  Protect the skin around your port from seat belts. Place a soft pad over your chest if needed.  Get help right away if you have a fever or you have redness, swelling, pain, drainage, or a bad smell at the port site. This information is not intended to replace advice given to you by your health care provider. Make sure you discuss any questions you have with your health care provider. Document Revised: 10/29/2019 Document Reviewed: 10/29/2019 Elsevier Patient Education  [place].

## 2020-08-11 NOTE — Progress Notes (Signed)
[place] OFFICE PROGRESS NOTE  Patient Care Team: [name], MD as PCP - General (Family Medicine) [name], MD as Attending Physician (Family Medicine) [name], MD (Inactive) (Gastroenterology)  ASSESSMENT & PLAN:  Hodgkin lymphoma [facility]) He tolerated last cycle of treatment well without major side effects We will order repeat imaging study at the conclusion of cycle 6 of therapy  Crohn disease ([place]) He had minor flare of Crohn's responded well to dietary change and low-dose prednisone We will monitor carefully   No orders of the defined types were placed in this encounter.   All questions were answered. The patient knows to call the clinic with any problems, questions or concerns. The total time spent in the appointment was 20 minutes encounter with patients including review of chart and various tests results, discussions about plan of care and coordination of care plan   [name], MD 08/11/2020 10:40 AM  INTERVAL HISTORY: Please see below for problem oriented charting. He returns for chemotherapy today He had minor flare of Crohn's recently and took prednisone for several days He felt that dietary change make a lot of difference No worsening neuropathy No recent infection, fever or chills  SUMMARY OF ONCOLOGIC HISTORY: Oncology History  Hodgkin lymphoma ([place])  03/10/2020 Pathology Results   A. LYMPH NODE, RIGHT SUBMANDIBULAR, BIOPSY:  -  Classic Hodgkin lymphoma  -  See comment   COMMENT:   The submitted lymph nodes are effaced by a vaguely nodular lymphoid proliferation separated by bands of fibrosis with admixed inflammatory cells including neutrophils and eosinophils. The lymphoid proliferation  is composed predominantly of small mature lymphocytes with scattered large atypical cells. The atypical cells have mono and multi-lobated nuclei and prominent nucleoli characteristic of Reed-Sternberg cells.  Lacunar cells are also present.    The neoplastic cells are CD30, CD15 (subset), Pax-5 (dim) and CD20 positive by immunohistochemistry. EBV by in-situ hybridization is positive in the Reed-Sternberg cells. CD3 highlights the background  T-cells.  EBV in-situ hybridization is negative.   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    03/14/2020 Initial Diagnosis   Hodgkin lymphoma ([place])   03/14/2020 Cancer Staging   Staging form: Hodgkin and Non-Hodgkin Lymphoma, AJCC 8th Edition - Clinical stage from 03/14/2020: Stage I (Hodgkin lymphoma, B - Symptoms) - Signed by [name], MD on 03/14/2020   03/18/2020 Echocardiogram   1. Left ventricular ejection fraction, by estimation, is 55 to 60%. The left ventricle has normal function. The left ventricle has no regional wall motion abnormalities. Left ventricular diastolic parameters were normal. The average left ventricular global longitudinal strain is -15.9 %. The global longitudinal strain is normal.  2. Right ventricular systolic function is normal. The right ventricular size is normal. There is normal pulmonary artery systolic pressure.  3. The mitral valve is normal in structure. No evidence of mitral valve regurgitation. No evidence of mitral stenosis.  4. The aortic valve is normal in structure. Aortic valve regurgitation is not visualized. No aortic stenosis is present.  5. The inferior vena cava is normal in size with greater than 50% respiratory variability, suggesting right atrial pressure of 3 mmHg.   03/20/2020 Procedure   Placement of a subcutaneous port device. Catheter tip at the superior cavoatrial junction.   03/24/2020 -  Chemotherapy   The patient had ABVD for chemotherapy treatment.     05/16/2020 PET scan   1. Small hypermetabolic RIGHT level  II lymph node is indeterminate. Activity is similar to liver activity. No comparison available. 2. Reduction in size  and no significant metabolic activity of RIGHT supraclavicular lymph node ( [place] 1). 3. No evidence lymphoma chest, abdomen pelvis.  Normal spleen.  4. Intense metabolic activity localizing to LEFT paraspinal musculature in the cervical neck. Favor benign physiologic muscle musculature in the LEFT neck. Recommend clinical correlation for muscle spasm or trauma. If concern for unlikely muscular lymphoma, consider contrast CT or MRI of the neck.   06/10/2020 Echocardiogram    1. Left ventricular ejection fraction, by estimation, is 55 to 60%. The left ventricle has normal function. The left ventricle has no regional wall motion abnormalities. Left ventricular diastolic parameters were normal. The average left ventricular global longitudinal strain is -21.6 %. The global longitudinal strain is normal.  2. Right ventricular systolic function is normal. The right ventricular size is normal. Tricuspid regurgitation signal is inadequate for assessing PA pressure.  3. The mitral valve is normal in structure. Trivial mitral valve regurgitation. No evidence of mitral stenosis.  4. The aortic valve is tricuspid. Aortic valve regurgitation is not visualized. No aortic stenosis is present.       REVIEW OF SYSTEMS:   Constitutional: Denies fevers, chills or abnormal weight loss Eyes: Denies blurriness of vision Ears, nose, mouth, throat, and face: Denies mucositis or sore throat Respiratory: Denies cough, dyspnea or wheezes Cardiovascular: Denies palpitation, chest discomfort or lower extremity swelling Gastrointestinal:  Denies nausea, heartburn or change in bowel habits Skin: Denies abnormal skin rashes Lymphatics: Denies new lymphadenopathy or easy bruising Neurological:Denies numbness, tingling or new weaknesses Behavioral/Psych: Mood is stable, no new changes  All other systems were reviewed with the patient and are negative.  I have reviewed the past medical history, past surgical history,  social history and family history with the patient and they are unchanged from previous note.  ALLERGIES:  is allergic to amoxicillin.  MEDICATIONS:  Current Outpatient Medications  Medication Sig Dispense Refill  . acetaminophen (TYLENOL) 500 MG tablet Take 1,000 mg by mouth every 6 (six) hours as needed for moderate pain or headache.    . Carboxymethylcellul-Glycerin (LUBRICATING EYE DROPS OP) Place 1 drop into both eyes daily as needed (dry eyes).    . Cholecalciferol (VITAMIN D) 50 MCG (2000 UT) tablet Take 6,000 Units by mouth daily.    Marland Kitchen EPINEPHrine 0.3 mg/0.3 mL IJ SOAJ injection Inject 0.3 mg into the muscle as needed for anaphylaxis.    Marland Kitchen lidocaine-prilocaine (EMLA) cream Apply to affected area once (Patient taking differently: Apply 1 application topically daily as needed (port access). ) 30 g 3  . Magnesium 250 MG TABS Take 250 mg by mouth daily.    . Multiple Vitamins-Minerals (ZINC PO) Take 2 tablets by mouth daily. With quercetin    . ondansetron (ZOFRAN) 8 MG tablet Take 1 tablet (8 mg total) by mouth every 8 (eight) hours as needed. Start on the third day after chemotherapy. (Patient taking differently: Take 8 mg by mouth every 8 (eight) hours as needed for nausea or vomiting. Start on the third day after chemotherapy.) 30 tablet 1  . OVER THE COUNTER MEDICATION Take 2 capsules by mouth at bedtime as needed (sleep). Stress Relief otc supplement    . oxymetazoline (AFRIN) 0.05 % nasal spray Place 1 spray into both nostrils 2 (two) times daily as needed for congestion.    . polyethylene glycol (MIRALAX / GLYCOLAX) 17 g packet Take 17 g by mouth  daily.    . predniSONE (DELTASONE) 20 MG tablet Take 2 tablets (40 mg total) by mouth daily with breakfast. 28 tablet 1  . prochlorperazine (COMPAZINE) 10 MG tablet Take 1 tablet (10 mg total) by mouth every 6 (six) hours as needed (Nausea or vomiting). 30 tablet 1  . SUPER B COMPLEX/C PO Take 1 tablet by mouth daily.    . TURMERIC PO Take 2  capsules by mouth daily.     No current facility-administered medications for this visit.    PHYSICAL EXAMINATION: ECOG PERFORMANCE STATUS: 0 - Asymptomatic  Vitals:   08/11/20 1011  BP: 131/79  Pulse: 92  Resp: 18  Temp: 97.7 F (36.5 C)  SpO2: 99%   Filed Weights   08/11/20 1011  Weight: 227 lb 3.2 oz (103.1 kg)    GENERAL:alert, no distress and comfortable SKIN: skin color, texture, turgor are normal, no rashes or significant lesions EYES: normal, Conjunctiva are pink and non-injected, sclera clear OROPHARYNX:no exudate, no erythema and lips, buccal mucosa, and tongue normal  NECK: supple, thyroid normal size, non-tender, without nodularity LYMPH:  no palpable lymphadenopathy in the cervical, axillary or inguinal LUNGS: clear to auscultation and percussion with normal breathing effort HEART: regular rate & rhythm and no murmurs and no lower extremity edema ABDOMEN:abdomen soft, non-tender and normal bowel sounds Musculoskeletal:no cyanosis of digits and no clubbing  NEURO: alert & oriented x 3 with fluent speech, no focal motor/sensory deficits  LABORATORY DATA:  I have reviewed the data as listed    Component Value Date/Time   NA 139 07/28/2020 0852   NA 141 04/27/2013 1217   K 4.1 07/28/2020 0852   K 4.0 04/27/2013 1217   CL 108 07/28/2020 0852   CL 106 04/26/2012 1550   CO2 27 07/28/2020 0852   CO2 28 04/27/2013 1217   GLUCOSE 104 (H) 07/28/2020 0852   GLUCOSE 92 04/27/2013 1217   GLUCOSE 108 (H) 04/26/2012 1550   BUN 19 07/28/2020 0852   BUN 10.2 04/27/2013 1217   CREATININE 1.11 07/28/2020 0852   CREATININE 0.8 04/27/2013 1217   CALCIUM 9.4 07/28/2020 0852   CALCIUM 9.7 04/27/2013 1217   PROT 7.3 07/28/2020 0852   PROT 7.5 04/27/2013 1217   ALBUMIN 4.0 07/28/2020 0852   ALBUMIN 3.6 04/27/2013 1217   AST 25 07/28/2020 0852   AST 14 04/27/2013 1217   ALT 27 07/28/2020 0852   ALT 23 04/27/2013 1217   ALKPHOS 53 07/28/2020 0852   ALKPHOS 64  04/27/2013 1217   BILITOT 0.5 07/28/2020 0852   BILITOT 0.86 04/27/2013 1217   GFRNONAA >60 07/28/2020 0852   GFRAA >60 03/24/2020 0853    No results found for: SPEP, UPEP  Lab Results  Component Value Date   WBC 8.2 08/11/2020   NEUTROABS 5.3 08/11/2020   HGB 13.9 08/11/2020   HCT 41.1 08/11/2020   MCV 90.5 08/11/2020   PLT 243 08/11/2020      Chemistry      Component Value Date/Time   NA 139 07/28/2020 0852   NA 141 04/27/2013 1217   K 4.1 07/28/2020 0852   K 4.0 04/27/2013 1217   CL 108 07/28/2020 0852   CL 106 04/26/2012 1550   CO2 27 07/28/2020 0852   CO2 28 04/27/2013 1217   BUN 19 07/28/2020 0852   BUN 10.2 04/27/2013 1217   CREATININE 1.11 07/28/2020 0852   CREATININE 0.8 04/27/2013 1217      Component Value Date/Time   CALCIUM 9.4 07/28/2020  4561   CALCIUM 9.7 04/27/2013 1217   ALKPHOS 53 07/28/2020 0852   ALKPHOS 64 04/27/2013 1217   AST 25 07/28/2020 0852   AST 14 04/27/2013 1217   ALT 27 07/28/2020 0852   ALT 23 04/27/2013 1217   BILITOT 0.5 07/28/2020 0852   BILITOT 0.86 04/27/2013 1217

## 2020-08-11 NOTE — Patient Instructions (Signed)
[facility] Discharge Instructions for Patients Receiving Chemotherapy  Today you received the following chemotherapy agents: doxorubicin/vinblastine/dacarbazine.  To help prevent nausea and vomiting after your treatment, we encourage you to take your nausea medication as directed.   If you develop nausea and vomiting that is not controlled by your nausea medication, call the clinic.   BELOW ARE SYMPTOMS THAT SHOULD BE REPORTED IMMEDIATELY:  *FEVER GREATER THAN 100.5 F  *CHILLS WITH OR WITHOUT FEVER  NAUSEA AND VOMITING THAT IS NOT CONTROLLED WITH YOUR NAUSEA MEDICATION  *UNUSUAL SHORTNESS OF BREATH  *UNUSUAL BRUISING OR BLEEDING  TENDERNESS IN MOUTH AND THROAT WITH OR WITHOUT PRESENCE OF ULCERS  *URINARY PROBLEMS  *BOWEL PROBLEMS  UNUSUAL RASH Items with * indicate a potential emergency and should be followed up as soon as possible.  Feel free to call the clinic should you have any questions or concerns. The clinic phone number is [phone].  Please show the [place] at check-in to the Emergency Department and triage nurse.

## 2020-08-11 NOTE — Assessment & Plan Note (Signed)
He had minor flare of Crohn's responded well to dietary change and low-dose prednisone We will monitor carefully

## 2020-08-11 NOTE — Assessment & Plan Note (Signed)
He tolerated last cycle of treatment well without major side effects We will order repeat imaging study at the conclusion of cycle 6 of therapy

## 2020-08-25 ENCOUNTER — Other Ambulatory Visit: Payer: Self-pay

## 2020-08-25 ENCOUNTER — Inpatient Hospital Stay: Payer: 59

## 2020-08-25 VITALS — BP 129/67 | HR 70 | Temp 98.4°F | Resp 18 | Ht 77.0 in | Wt 230.8 lb

## 2020-08-25 DIAGNOSIS — D509 Iron deficiency anemia, unspecified: Secondary | ICD-10-CM

## 2020-08-25 DIAGNOSIS — C8111 Nodular sclerosis classical Hodgkin lymphoma, lymph nodes of head, face, and neck: Secondary | ICD-10-CM

## 2020-08-25 DIAGNOSIS — Z5111 Encounter for antineoplastic chemotherapy: Secondary | ICD-10-CM | POA: Diagnosis not present

## 2020-08-25 DIAGNOSIS — R590 Localized enlarged lymph nodes: Secondary | ICD-10-CM

## 2020-08-25 DIAGNOSIS — K50919 Crohn's disease, unspecified, with unspecified complications: Secondary | ICD-10-CM

## 2020-08-25 LAB — CBC WITH DIFFERENTIAL/PLATELET
Abs Immature Granulocytes: 0.01 10*3/uL (ref 0.00–0.07)
Basophils Absolute: 0.1 10*3/uL (ref 0.0–0.1)
Basophils Relative: 1 %
Eosinophils Absolute: 0.3 10*3/uL (ref 0.0–0.5)
Eosinophils Relative: 5 %
HCT: 41.7 % (ref 39.0–52.0)
Hemoglobin: 13.9 g/dL (ref 13.0–17.0)
Immature Granulocytes: 0 %
Lymphocytes Relative: 18 %
Lymphs Abs: 1.2 10*3/uL (ref 0.7–4.0)
MCH: 30.8 pg (ref 26.0–34.0)
MCHC: 33.3 g/dL (ref 30.0–36.0)
MCV: 92.3 fL (ref 80.0–100.0)
Monocytes Absolute: 1.1 10*3/uL — ABNORMAL HIGH (ref 0.1–1.0)
Monocytes Relative: 16 %
Neutro Abs: 4.1 10*3/uL (ref 1.7–7.7)
Neutrophils Relative %: 60 %
Platelets: 250 10*3/uL (ref 150–400)
RBC: 4.52 MIL/uL (ref 4.22–5.81)
RDW: 14.6 % (ref 11.5–15.5)
WBC: 6.8 10*3/uL (ref 4.0–10.5)
nRBC: 0 % (ref 0.0–0.2)

## 2020-08-25 LAB — COMPREHENSIVE METABOLIC PANEL
ALT: 23 U/L (ref 0–44)
AST: 17 U/L (ref 15–41)
Albumin: 4.2 g/dL (ref 3.5–5.0)
Alkaline Phosphatase: 46 U/L (ref 38–126)
Anion gap: 6 (ref 5–15)
BUN: 19 mg/dL (ref 6–20)
CO2: 23 mmol/L (ref 22–32)
Calcium: 9.2 mg/dL (ref 8.9–10.3)
Chloride: 108 mmol/L (ref 98–111)
Creatinine, Ser: 1.09 mg/dL (ref 0.61–1.24)
GFR, Estimated: >60 mL/min (ref >60)
Glucose, Bld: 102 mg/dL — ABNORMAL HIGH (ref 70–99)
Potassium: 4.3 mmol/L (ref 3.5–5.1)
Sodium: 137 mmol/L (ref 135–145)
Total Bilirubin: 0.3 mg/dL (ref 0.3–1.2)
Total Protein: 7.4 g/dL (ref 6.5–8.1)

## 2020-08-25 MED ORDER — SODIUM CHLORIDE 0.9% FLUSH
10.0000 mL | Freq: Once | INTRAVENOUS | Status: AC
  Administered 2020-08-25: 10 mL
  Filled 2020-08-25: qty 10

## 2020-08-25 MED ORDER — HEPARIN SOD (PORK) LOCK FLUSH 100 UNIT/ML IV SOLN
500.0000 [IU] | Freq: Once | INTRAVENOUS | Status: AC | PRN
  Administered 2020-08-25: 500 [IU]
  Filled 2020-08-25: qty 5

## 2020-08-25 MED ORDER — SODIUM CHLORIDE 0.9 % IV SOLN
3.0000 mg/m2 | Freq: Once | INTRAVENOUS | Status: AC
  Administered 2020-08-25: 6.9 mg via INTRAVENOUS
  Filled 2020-08-25: qty 6.9

## 2020-08-25 MED ORDER — PALONOSETRON HCL INJECTION 0.25 MG/5ML
0.2500 mg | Freq: Once | INTRAVENOUS | Status: AC
  Administered 2020-08-25: 0.25 mg via INTRAVENOUS

## 2020-08-25 MED ORDER — SODIUM CHLORIDE 0.9 % IV SOLN
Freq: Once | INTRAVENOUS | Status: AC
  Filled 2020-08-25: qty 250

## 2020-08-25 MED ORDER — SODIUM CHLORIDE 0.9% FLUSH
10.0000 mL | INTRAVENOUS | Status: DC | PRN
  Administered 2020-08-25: 10 mL
  Filled 2020-08-25: qty 10

## 2020-08-25 MED ORDER — SODIUM CHLORIDE 0.9 % IV SOLN
150.0000 mg | Freq: Once | INTRAVENOUS | Status: AC
  Administered 2020-08-25: 150 mg via INTRAVENOUS
  Filled 2020-08-25: qty 150

## 2020-08-25 MED ORDER — DEXAMETHASONE SODIUM PHOSPHATE 10 MG/ML IJ SOLN
5.0000 mg | Freq: Once | INTRAMUSCULAR | Status: AC
  Administered 2020-08-25: 5 mg via INTRAVENOUS

## 2020-08-25 MED ORDER — DOXORUBICIN HCL CHEMO IV INJECTION 2 MG/ML
20.0000 mg/m2 | Freq: Once | INTRAVENOUS | Status: AC
  Administered 2020-08-25: 46 mg via INTRAVENOUS
  Filled 2020-08-25: qty 23

## 2020-08-25 MED ORDER — SODIUM CHLORIDE 0.9 % IV SOLN
300.0000 mg/m2 | Freq: Once | INTRAVENOUS | Status: AC
  Administered 2020-08-25: 690 mg via INTRAVENOUS
  Filled 2020-08-25: qty 69

## 2020-08-25 NOTE — Patient Instructions (Signed)
[facility] Discharge Instructions for Patients Receiving Chemotherapy  Today you received the following chemotherapy agents: Doxorubicin (Adriamycin), Dacarbazine (Dtic), Vinblastine (Velban)  To help prevent nausea and vomiting after your treatment, we encourage you to take your nausea medication  as prescribed.    If you develop nausea and vomiting that is not controlled by your nausea medication, call the clinic.   BELOW ARE SYMPTOMS THAT SHOULD BE REPORTED IMMEDIATELY:  *FEVER GREATER THAN 100.5 F  *CHILLS WITH OR WITHOUT FEVER  NAUSEA AND VOMITING THAT IS NOT CONTROLLED WITH YOUR NAUSEA MEDICATION  *UNUSUAL SHORTNESS OF BREATH  *UNUSUAL BRUISING OR BLEEDING  TENDERNESS IN MOUTH AND THROAT WITH OR WITHOUT PRESENCE OF ULCERS  *URINARY PROBLEMS  *BOWEL PROBLEMS  UNUSUAL RASH Items with * indicate a potential emergency and should be followed up as soon as possible.  Feel free to call the clinic should you have any questions or concerns. The clinic phone number is [phone].  Please show the [place] at check-in to the Emergency Department and triage nurse.

## 2020-08-25 NOTE — Patient Instructions (Signed)
Implanted [facility] Guide An implanted port is a device that is placed under the skin. It is usually placed in the chest. The device can be used to give IV medicine, to take blood, or for dialysis. You may have an implanted port if:  You need IV medicine that would be irritating to the small veins in your hands or arms.  You need IV medicines, such as antibiotics, for a long period of time.  You need IV nutrition for a long period of time.  You need dialysis. When you have a port, your health care provider can choose to use the port instead of veins in your arms for these procedures. You may have fewer limitations when using a port than you would if you used other types of long-term IVs, and you will likely be able to return to normal activities after your incision heals. An implanted port has two main parts:  Reservoir. The reservoir is the part where a needle is inserted to give medicines or draw blood. The reservoir is round. After it is placed, it appears as a small, raised area under your skin.  Catheter. The catheter is a thin, flexible tube that connects the reservoir to a vein. Medicine that is inserted into the reservoir goes into the catheter and then into the vein. How is my port accessed? To access your port:  A numbing cream may be placed on the skin over the port site.  Your health care provider will put on a mask and sterile gloves.  The skin over your port will be cleaned carefully with a germ-killing soap and allowed to dry.  Your health care provider will gently pinch the port and insert a needle into it.  Your health care provider will check for a blood return to make sure the port is in the vein and is not clogged.  If your port needs to remain accessed to get medicine continuously (constant infusion), your health care provider will place a clear bandage (dressing) over the needle site. The dressing and needle will need to be changed every week, or as told by your  health care provider. What is flushing? Flushing helps keep the port from getting clogged. Follow instructions from your health care provider about how and when to flush the port. Ports are usually flushed with saline solution or a medicine called heparin. The need for flushing will depend on how the port is used:  If the port is only used from time to time to give medicines or draw blood, the port may need to be flushed: ? Before and after medicines have been given. ? Before and after blood has been drawn. ? As part of routine maintenance. Flushing may be recommended every 4-6 weeks.  If a constant infusion is running, the port may not need to be flushed.  Throw away any syringes in a disposal container that is meant for sharp items (sharps container). You can buy a sharps container from a pharmacy, or you can make one by using an empty hard plastic bottle with a cover. How long will my port stay implanted? The port can stay in for as long as your health care provider thinks it is needed. When it is time for the port to come out, a surgery will be done to remove it. The surgery will be similar to the procedure that was done to put the port in. Follow these instructions at home:  Flush your port as told by your health care  provider.  If you need an infusion over several days, follow instructions from your health care provider about how to take care of your port site. Make sure you: ? Wash your hands with soap and water before you change your dressing. If soap and water are not available, use alcohol-based hand sanitizer. ? Change your dressing as told by your health care provider. ? Place any used dressings or infusion bags into a plastic bag. Throw that bag in the trash. ? Keep the dressing that covers the needle clean and dry. Do not get it wet. ? Do not use scissors or sharp objects near the tube. ? Keep the tube clamped, unless it is being used.  Check your port site every day for signs  of infection. Check for: ? Redness, swelling, or pain. ? Fluid or blood. ? Pus or a bad smell.  Protect the skin around the port site. ? Avoid wearing bra straps that rub or irritate the site. ? Protect the skin around your port from seat belts. Place a soft pad over your chest if needed.  Bathe or shower as told by your health care provider. The site may get wet as long as you are not actively receiving an infusion.  Return to your normal activities as told by your health care provider. Ask your health care provider what activities are safe for you.  Carry a medical alert card or wear a medical alert bracelet at all times. This will let health care providers know that you have an implanted port in case of an emergency.   Get help right away if:  You have redness, swelling, or pain at the port site.  You have fluid or blood coming from your port site.  You have pus or a bad smell coming from the port site.  You have a fever. Summary  Implanted ports are usually placed in the chest for long-term IV access.  Follow instructions from your health care provider about flushing the port and changing bandages (dressings).  Take care of the area around your port by avoiding clothing that puts pressure on the area, and by watching for signs of infection.  Protect the skin around your port from seat belts. Place a soft pad over your chest if needed.  Get help right away if you have a fever or you have redness, swelling, pain, drainage, or a bad smell at the port site. This information is not intended to replace advice given to you by your health care provider. Make sure you discuss any questions you have with your health care provider. Document Revised: 10/29/2019 Document Reviewed: 10/29/2019 Elsevier Patient Education  [place].

## 2020-09-08 ENCOUNTER — Inpatient Hospital Stay: Payer: 59 | Attending: Hematology and Oncology

## 2020-09-08 ENCOUNTER — Other Ambulatory Visit: Payer: Self-pay

## 2020-09-08 ENCOUNTER — Inpatient Hospital Stay (HOSPITAL_BASED_OUTPATIENT_CLINIC_OR_DEPARTMENT_OTHER): Payer: 59 | Admitting: Hematology and Oncology

## 2020-09-08 ENCOUNTER — Inpatient Hospital Stay: Payer: 59

## 2020-09-08 ENCOUNTER — Encounter: Payer: Self-pay | Admitting: Hematology and Oncology

## 2020-09-08 VITALS — BP 131/69 | HR 78 | Temp 97.9°F | Resp 18 | Ht 77.0 in | Wt 226.2 lb

## 2020-09-08 DIAGNOSIS — K50919 Crohn's disease, unspecified, with unspecified complications: Secondary | ICD-10-CM | POA: Diagnosis not present

## 2020-09-08 DIAGNOSIS — C8111 Nodular sclerosis classical Hodgkin lymphoma, lymph nodes of head, face, and neck: Secondary | ICD-10-CM

## 2020-09-08 DIAGNOSIS — Z5111 Encounter for antineoplastic chemotherapy: Secondary | ICD-10-CM | POA: Diagnosis present

## 2020-09-08 DIAGNOSIS — C8190 Hodgkin lymphoma, unspecified, unspecified site: Secondary | ICD-10-CM | POA: Diagnosis not present

## 2020-09-08 DIAGNOSIS — D509 Iron deficiency anemia, unspecified: Secondary | ICD-10-CM

## 2020-09-08 DIAGNOSIS — R590 Localized enlarged lymph nodes: Secondary | ICD-10-CM

## 2020-09-08 LAB — CBC WITH DIFFERENTIAL/PLATELET
Abs Immature Granulocytes: 0.07 10*3/uL (ref 0.00–0.07)
Basophils Absolute: 0.1 10*3/uL (ref 0.0–0.1)
Basophils Relative: 1 %
Eosinophils Absolute: 0.3 10*3/uL (ref 0.0–0.5)
Eosinophils Relative: 5 %
HCT: 40.4 % (ref 39.0–52.0)
Hemoglobin: 13.6 g/dL (ref 13.0–17.0)
Immature Granulocytes: 1 %
Lymphocytes Relative: 16 %
Lymphs Abs: 1.1 10*3/uL (ref 0.7–4.0)
MCH: 30.5 pg (ref 26.0–34.0)
MCHC: 33.7 g/dL (ref 30.0–36.0)
MCV: 90.6 fL (ref 80.0–100.0)
Monocytes Absolute: 1.1 10*3/uL — ABNORMAL HIGH (ref 0.1–1.0)
Monocytes Relative: 14 %
Neutro Abs: 4.6 10*3/uL (ref 1.7–7.7)
Neutrophils Relative %: 63 %
Platelets: 253 10*3/uL (ref 150–400)
RBC: 4.46 MIL/uL (ref 4.22–5.81)
RDW: 14.6 % (ref 11.5–15.5)
WBC: 7.3 10*3/uL (ref 4.0–10.5)
nRBC: 0 % (ref 0.0–0.2)

## 2020-09-08 LAB — COMPREHENSIVE METABOLIC PANEL
ALT: 31 U/L (ref 0–44)
AST: 27 U/L (ref 15–41)
Albumin: 4.1 g/dL (ref 3.5–5.0)
Alkaline Phosphatase: 44 U/L (ref 38–126)
Anion gap: 5 (ref 5–15)
BUN: 15 mg/dL (ref 6–20)
CO2: 24 mmol/L (ref 22–32)
Calcium: 9.2 mg/dL (ref 8.9–10.3)
Chloride: 108 mmol/L (ref 98–111)
Creatinine, Ser: 0.92 mg/dL (ref 0.61–1.24)
GFR, Estimated: >60 mL/min (ref >60)
Glucose, Bld: 98 mg/dL (ref 70–99)
Potassium: 4 mmol/L (ref 3.5–5.1)
Sodium: 137 mmol/L (ref 135–145)
Total Bilirubin: 0.7 mg/dL (ref 0.3–1.2)
Total Protein: 7.1 g/dL (ref 6.5–8.1)

## 2020-09-08 MED ORDER — SODIUM CHLORIDE 0.9 % IV SOLN
300.0000 mg/m2 | Freq: Once | INTRAVENOUS | Status: AC
  Administered 2020-09-08: 690 mg via INTRAVENOUS
  Filled 2020-09-08: qty 69

## 2020-09-08 MED ORDER — VINBLASTINE SULFATE CHEMO INJECTION 1 MG/ML
3.0000 mg/m2 | Freq: Once | INTRAVENOUS | Status: AC
  Administered 2020-09-08: 6.9 mg via INTRAVENOUS
  Filled 2020-09-08: qty 6.9

## 2020-09-08 MED ORDER — DOXORUBICIN HCL CHEMO IV INJECTION 2 MG/ML
20.0000 mg/m2 | Freq: Once | INTRAVENOUS | Status: AC
  Administered 2020-09-08: 46 mg via INTRAVENOUS
  Filled 2020-09-08: qty 23

## 2020-09-08 MED ORDER — PALONOSETRON HCL INJECTION 0.25 MG/5ML
0.2500 mg | Freq: Once | INTRAVENOUS | Status: AC
  Administered 2020-09-08: 0.25 mg via INTRAVENOUS

## 2020-09-08 MED ORDER — SODIUM CHLORIDE 0.9 % IV SOLN
Freq: Once | INTRAVENOUS | Status: AC
Start: 2020-09-08 — End: 2020-09-08
  Filled 2020-09-08: qty 250

## 2020-09-08 MED ORDER — SODIUM CHLORIDE 0.9 % IV SOLN
150.0000 mg | Freq: Once | INTRAVENOUS | Status: AC
  Administered 2020-09-08: 150 mg via INTRAVENOUS
  Filled 2020-09-08: qty 150

## 2020-09-08 MED ORDER — SODIUM CHLORIDE 0.9% FLUSH
10.0000 mL | Freq: Once | INTRAVENOUS | Status: AC
  Administered 2020-09-08: 10 mL
  Filled 2020-09-08: qty 10

## 2020-09-08 MED ORDER — HEPARIN SOD (PORK) LOCK FLUSH 100 UNIT/ML IV SOLN
500.0000 [IU] | Freq: Once | INTRAVENOUS | Status: AC | PRN
  Administered 2020-09-08: 500 [IU]
  Filled 2020-09-08: qty 5

## 2020-09-08 MED ORDER — DEXAMETHASONE SODIUM PHOSPHATE 10 MG/ML IJ SOLN
5.0000 mg | Freq: Once | INTRAMUSCULAR | Status: AC
  Administered 2020-09-08: 5 mg via INTRAVENOUS

## 2020-09-08 MED ORDER — PALONOSETRON HCL INJECTION 0.25 MG/5ML
INTRAVENOUS | Status: AC
  Filled 2020-09-08: qty 5

## 2020-09-08 MED ORDER — DEXAMETHASONE SODIUM PHOSPHATE 10 MG/ML IJ SOLN
INTRAMUSCULAR | Status: AC
  Filled 2020-09-08: qty 1

## 2020-09-08 MED ORDER — PREDNISONE 20 MG PO TABS: 40.0000 mg | ORAL_TABLET | Freq: Every day | ORAL | 1 refills | Status: DC

## 2020-09-08 MED ORDER — SODIUM CHLORIDE 0.9% FLUSH
10.0000 mL | INTRAVENOUS | Status: DC | PRN
  Administered 2020-09-08: 10 mL
  Filled 2020-09-08: qty 10

## 2020-09-08 NOTE — Progress Notes (Signed)
[facility] OFF[facility] PROGRESS NOTE  Patient Care Team: [name], MD as PCP - General (Family Medicine) [name], MD as Attending Physician (Family Medicine) [name], MD (Inactive) (Gastroenterology)  ASSESSMENT & PLAN:  Hodgkin lymphoma [facility]) He is doing well without major side effects from treatment He has intermittent changes in his bowel habits but overall his symptoms are well controlled We will proceed with final treatment today I plan to repeat PET CT scan in a month for further follow-up and if PET CT scan is completely normal, we will get his port removed  Crohn disease ([place]) He had minor flare of Crohn's responded well to dietary change and low-dose prednisone We will monitor carefully I recommend prednisone taper once his chemotherapy is finished I gave him a general idea about prednisone taper course   Orders Placed This Encounter  Procedures  . NM PET Image Restage (PS) Skull Base to Thigh    Standing Status:   Future    Standing Expiration Date:   09/08/2021    Order Specific Question:   If indicated for the ordered procedure, I authorize the administration of a radiopharmaceutical per Radiology protocol    Answer:   Yes    Order Specific Question:   Preferred imaging location?    Answer:   [facility]    Order Specific Question:   Radiology Contrast Protocol - do NOT remove file path    Answer:   [email or web address]    All questions were answered. The patient knows to call the clinic with any problems, questions or concerns. The total time spent in the appointment was 20 minutes encounter with patients including review of chart and various tests results, discussions about plan of care and coordination of care plan   Mark Lark, MD 09/08/2020 6:52 PM  INTERVAL HISTORY: Please see below for problem oriented charting. He returns for final chemotherapy treatment today Denies peripheral  neuropathy He has mild intermittent flare of colitis but not severe with prednisone treatment He is keeping track of his diet No recent infection, fever or chills  SUMMARY OF ONCOLOGIC HISTORY: Oncology History  Hodgkin lymphoma ([place])  03/10/2020 Pathology Results   A. LYMPH NODE, RIGHT SUBMANDIBULAR, BIOPSY:  -  Classic Hodgkin lymphoma  -  See comment   COMMENT:   The submitted lymph nodes are effaced by a vaguely nodular lymphoid proliferation separated by bands of fibrosis with admixed inflammatory cells including neutrophils and eosinophils. The lymphoid proliferation  is composed predominantly of small mature lymphocytes with scattered large atypical cells. The atypical cells have mono and multi-lobated nuclei and prominent nucleoli characteristic of Reed-Sternberg cells.  Lacunar cells are also present.   The neoplastic cells are CD30, CD15 (subset), Pax-5 (dim) and CD20 positive by immunohistochemistry. EBV by in-situ hybridization is positive in the Reed-Sternberg cells. CD3 highlights the background  T-cells.  EBV in-situ hybridization is negative.   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    03/14/2020 Initial Diagnosis   Hodgkin lymphoma ([place])   03/14/2020 Cancer Staging   Staging form: Hodgkin and Non-Hodgkin Lymphoma, AJCC 8th Edition - Clinical stage from 03/14/2020: Stage I (Hodgkin lymphoma, B - Symptoms) - Signed by Mark Lark, MD on 03/14/2020   03/18/2020 Echocardiogram   1. Left ventricular ejection fraction, by estimation, is 55 to 60%. The left ventricle has normal function. The left ventricle has no regional wall motion abnormalities. Left ventricular diastolic  parameters were normal. The average left ventricular global longitudinal strain is -15.9 %. The global longitudinal strain is normal.  2. Right ventricular systolic function is normal. The right  ventricular size is normal. There is normal pulmonary artery systolic pressure.  3. The mitral valve is normal in structure. No evidence of mitral valve regurgitation. No evidence of mitral stenosis.  4. The aortic valve is normal in structure. Aortic valve regurgitation is not visualized. No aortic stenosis is present.  5. The inferior vena cava is normal in size with greater than 50% respiratory variability, suggesting right atrial pressure of 3 mmHg.   03/20/2020 Procedure   Placement of a subcutaneous port device. Catheter tip at the superior cavoatrial junction.   03/24/2020 -  Chemotherapy   The patient had ABVD for chemotherapy treatment.     05/16/2020 PET scan   1. Small hypermetabolic RIGHT level II lymph node is indeterminate. Activity is similar to liver activity. No comparison available. 2. Reduction in size and no significant metabolic activity of RIGHT supraclavicular lymph node ( [place] 1). 3. No evidence lymphoma chest, abdomen pelvis.  Normal spleen.  4. Intense metabolic activity localizing to LEFT paraspinal musculature in the cervical neck. Favor benign physiologic muscle musculature in the LEFT neck. Recommend clinical correlation for muscle spasm or trauma. If concern for unlikely muscular lymphoma, consider contrast CT or MRI of the neck.   06/10/2020 Echocardiogram    1. Left ventricular ejection fraction, by estimation, is 55 to 60%. The left ventricle has normal function. The left ventricle has no regional wall motion abnormalities. Left ventricular diastolic parameters were normal. The average left ventricular global longitudinal strain is -21.6 %. The global longitudinal strain is normal.  2. Right ventricular systolic function is normal. The right ventricular size is normal. Tricuspid regurgitation signal is inadequate for assessing PA pressure.  3. The mitral valve is normal in structure. Trivial mitral valve regurgitation. No evidence of mitral stenosis.  4. The  aortic valve is tricuspid. Aortic valve regurgitation is not visualized. No aortic stenosis is present.       REVIEW OF SYSTEMS:   Constitutional: Denies fevers, chills or abnormal weight loss Eyes: Denies blurriness of vision Ears, nose, mouth, throat, and face: Denies mucositis or sore throat Respiratory: Denies cough, dyspnea or wheezes Cardiovascular: Denies palpitation, chest discomfort or lower extremity swelling Skin: Denies abnormal skin rashes Lymphatics: Denies new lymphadenopathy or easy bruising Neurological:Denies numbness, tingling or new weaknesses Behavioral/Psych: Mood is stable, no new changes  All other systems were reviewed with the patient and are negative.  I have reviewed the past medical history, past surgical history, social history and family history with the patient and they are unchanged from previous note.  ALLERGIES:  is allergic to amoxicillin.  MEDICATIONS:  Current Outpatient Medications  Medication Sig Dispense Refill  . acetaminophen (TYLENOL) 500 MG tablet Take 1,000 mg by mouth every 6 (six) hours as needed for moderate pain or headache.    . Carboxymethylcellul-Glycerin (LUBRICATING EYE DROPS OP) Place 1 drop into both eyes daily as needed (dry eyes).    . Cholecalciferol (VITAMIN D) 50 MCG (2000 UT) tablet Take 6,000 Units by mouth daily.    Marland Kitchen EPINEPHrine 0.3 mg/0.3 mL IJ SOAJ injection Inject 0.3 mg into the muscle as needed for anaphylaxis.    Marland Kitchen lidocaine-prilocaine (EMLA) cream Apply to affected area once (Patient taking differently: Apply 1 application topically daily as needed (port access). ) 30 g 3  . Magnesium 250 MG TABS  Take 250 mg by mouth daily.    . Multiple Vitamins-Minerals (ZINC PO) Take 2 tablets by mouth daily. With quercetin    . ondansetron (ZOFRAN) 8 MG tablet Take 1 tablet (8 mg total) by mouth every 8 (eight) hours as needed. Start on the third day after chemotherapy. (Patient taking differently: Take 8 mg by mouth every 8  (eight) hours as needed for nausea or vomiting. Start on the third day after chemotherapy.) 30 tablet 1  . OVER THE COUNTER MEDICATION Take 2 capsules by mouth at bedtime as needed (sleep). Stress Relief otc supplement    . oxymetazoline (AFRIN) 0.05 % nasal spray Place 1 spray into both nostrils 2 (two) times daily as needed for congestion.    . polyethylene glycol (MIRALAX / GLYCOLAX) 17 g packet Take 17 g by mouth daily.    . predniSONE (DELTASONE) 20 MG tablet Take 2 tablets (40 mg total) by mouth daily with breakfast. 60 tablet 1  . prochlorperazine (COMPAZINE) 10 MG tablet Take 1 tablet (10 mg total) by mouth every 6 (six) hours as needed (Nausea or vomiting). 30 tablet 1  . SUPER B COMPLEX/C PO Take 1 tablet by mouth daily.    . TURMERIC PO Take 2 capsules by mouth daily.     No current facility-administered medications for this visit.   Facility-Administered Medications Ordered in Other Visits  Medication Dose Route Frequency Provider Last Rate Last Admin  . sodium chloride flush (NS) 0.9 % injection 10 mL  10 mL Intracatheter PRN Mark Lark, MD   10 mL at 09/08/20 1522    PHYSICAL EXAMINATION: ECOG PERFORMANCE STATUS: 1 - Symptomatic but completely ambulatory  Vitals:   09/08/20 1102  BP: 131/69  Pulse: 78  Resp: 18  Temp: 97.9 F (36.6 C)  SpO2: 100%   Filed Weights   09/08/20 1102  Weight: 226 lb 3.2 oz (102.6 kg)    GENERAL:alert, no distress and comfortable Musculoskeletal:no cyanosis of digits and no clubbing  NEURO: alert & oriented x 3 with fluent speech, no focal motor/sensory deficits  LABORATORY DATA:  I have reviewed the data as listed    Component Value Date/Time   NA 137 09/08/2020 1048   NA 141 04/27/2013 1217   K 4.0 09/08/2020 1048   K 4.0 04/27/2013 1217   CL 108 09/08/2020 1048   CL 106 04/26/2012 1550   CO2 24 09/08/2020 1048   CO2 28 04/27/2013 1217   GLUCOSE 98 09/08/2020 1048   GLUCOSE 92 04/27/2013 1217   GLUCOSE 108 (H) 04/26/2012  1550   BUN 15 09/08/2020 1048   BUN 10.2 04/27/2013 1217   CREATININE 0.92 09/08/2020 1048   CREATININE 1.11 07/28/2020 0852   CREATININE 0.8 04/27/2013 1217   CALCIUM 9.2 09/08/2020 1048   CALCIUM 9.7 04/27/2013 1217   PROT 7.1 09/08/2020 1048   PROT 7.5 04/27/2013 1217   ALBUMIN 4.1 09/08/2020 1048   ALBUMIN 3.6 04/27/2013 1217   AST 27 09/08/2020 1048   AST 25 07/28/2020 0852   AST 14 04/27/2013 1217   ALT 31 09/08/2020 1048   ALT 27 07/28/2020 0852   ALT 23 04/27/2013 1217   ALKPHOS 44 09/08/2020 1048   ALKPHOS 64 04/27/2013 1217   BILITOT 0.7 09/08/2020 1048   BILITOT 0.5 07/28/2020 0852   BILITOT 0.86 04/27/2013 1217   GFRNONAA >60 09/08/2020 1048   GFRNONAA >60 07/28/2020 0852   GFRAA >60 03/24/2020 0853    No results found for: SPEP, UPEP  Lab Results  Component Value Date   WBC 7.3 09/08/2020   NEUTROABS 4.6 09/08/2020   HGB 13.6 09/08/2020   HCT 40.4 09/08/2020   MCV 90.6 09/08/2020   PLT 253 09/08/2020      Chemistry      Component Value Date/Time   NA 137 09/08/2020 1048   NA 141 04/27/2013 1217   K 4.0 09/08/2020 1048   K 4.0 04/27/2013 1217   CL 108 09/08/2020 1048   CL 106 04/26/2012 1550   CO2 24 09/08/2020 1048   CO2 28 04/27/2013 1217   BUN 15 09/08/2020 1048   BUN 10.2 04/27/2013 1217   CREATININE 0.92 09/08/2020 1048   CREATININE 1.11 07/28/2020 0852   CREATININE 0.8 04/27/2013 1217      Component Value Date/Time   CALCIUM 9.2 09/08/2020 1048   CALCIUM 9.7 04/27/2013 1217   ALKPHOS 44 09/08/2020 1048   ALKPHOS 64 04/27/2013 1217   AST 27 09/08/2020 1048   AST 25 07/28/2020 0852   AST 14 04/27/2013 1217   ALT 31 09/08/2020 1048   ALT 27 07/28/2020 0852   ALT 23 04/27/2013 1217   BILITOT 0.7 09/08/2020 1048   BILITOT 0.5 07/28/2020 0852   BILITOT 0.86 04/27/2013 1217

## 2020-09-08 NOTE — Patient Instructions (Signed)
[place] Discharge Instructions for Patients Receiving Chemotherapy  Today you received the following chemotherapy agents doxorubicin, vinblastine, dacarbine  To help prevent nausea and vomiting after your treatment, we encourage you to take your nausea medication as directed.   If you develop nausea and vomiting that is not controlled by your nausea medication, call the clinic.   BELOW ARE SYMPTOMS THAT SHOULD BE REPORTED IMMEDIATELY:  *FEVER GREATER THAN 100.5 F  *CHILLS WITH OR WITHOUT FEVER  NAUSEA AND VOMITING THAT IS NOT CONTROLLED WITH YOUR NAUSEA MEDICATION  *UNUSUAL SHORTNESS OF BREATH  *UNUSUAL BRUISING OR BLEEDING  TENDERNESS IN MOUTH AND THROAT WITH OR WITHOUT PRESENCE OF ULCERS  *URINARY PROBLEMS  *BOWEL PROBLEMS  UNUSUAL RASH Items with * indicate a potential emergency and should be followed up as soon as possible.  Feel free to call the clinic should you have any questions or concerns. The clinic phone number is [phone].  Please show the [facility] at check-in to the Emergency Department and triage nurse.

## 2020-09-08 NOTE — Assessment & Plan Note (Signed)
He had minor flare of Crohn's responded well to dietary change and low-dose prednisone We will monitor carefully I recommend prednisone taper once his chemotherapy is finished I gave him a general idea about prednisone taper course

## 2020-09-08 NOTE — Assessment & Plan Note (Signed)
He is doing well without major side effects from treatment He has intermittent changes in his bowel habits but overall his symptoms are well controlled We will proceed with final treatment today I plan to repeat PET CT scan in a month for further follow-up and if PET CT scan is completely normal, we will get his port removed

## 2020-09-08 NOTE — Patient Instructions (Signed)
Implanted [facility] Guide An implanted port is a device that is placed under the skin. It is usually placed in the chest. The device can be used to give IV medicine, to take blood, or for dialysis. You may have an implanted port if:  You need IV medicine that would be irritating to the small veins in your hands or arms.  You need IV medicines, such as antibiotics, for a long period of time.  You need IV nutrition for a long period of time.  You need dialysis. When you have a port, your health care provider can choose to use the port instead of veins in your arms for these procedures. You may have fewer limitations when using a port than you would if you used other types of long-term IVs, and you will likely be able to return to normal activities after your incision heals. An implanted port has two main parts:  Reservoir. The reservoir is the part where a needle is inserted to give medicines or draw blood. The reservoir is round. After it is placed, it appears as a small, raised area under your skin.  Catheter. The catheter is a thin, flexible tube that connects the reservoir to a vein. Medicine that is inserted into the reservoir goes into the catheter and then into the vein. How is my port accessed? To access your port:  A numbing cream may be placed on the skin over the port site.  Your health care provider will put on a mask and sterile gloves.  The skin over your port will be cleaned carefully with a germ-killing soap and allowed to dry.  Your health care provider will gently pinch the port and insert a needle into it.  Your health care provider will check for a blood return to make sure the port is in the vein and is not clogged.  If your port needs to remain accessed to get medicine continuously (constant infusion), your health care provider will place a clear bandage (dressing) over the needle site. The dressing and needle will need to be changed every week, or as told by your  health care provider. What is flushing? Flushing helps keep the port from getting clogged. Follow instructions from your health care provider about how and when to flush the port. Ports are usually flushed with saline solution or a medicine called heparin. The need for flushing will depend on how the port is used:  If the port is only used from time to time to give medicines or draw blood, the port may need to be flushed: ? Before and after medicines have been given. ? Before and after blood has been drawn. ? As part of routine maintenance. Flushing may be recommended every 4-6 weeks.  If a constant infusion is running, the port may not need to be flushed.  Throw away any syringes in a disposal container that is meant for sharp items (sharps container). You can buy a sharps container from a pharmacy, or you can make one by using an empty hard plastic bottle with a cover. How long will my port stay implanted? The port can stay in for as long as your health care provider thinks it is needed. When it is time for the port to come out, a surgery will be done to remove it. The surgery will be similar to the procedure that was done to put the port in. Follow these instructions at home:  Flush your port as told by your health care  provider.  If you need an infusion over several days, follow instructions from your health care provider about how to take care of your port site. Make sure you: ? Wash your hands with soap and water before you change your dressing. If soap and water are not available, use alcohol-based hand sanitizer. ? Change your dressing as told by your health care provider. ? Place any used dressings or infusion bags into a plastic bag. Throw that bag in the trash. ? Keep the dressing that covers the needle clean and dry. Do not get it wet. ? Do not use scissors or sharp objects near the tube. ? Keep the tube clamped, unless it is being used.  Check your port site every day for signs  of infection. Check for: ? Redness, swelling, or pain. ? Fluid or blood. ? Pus or a bad smell.  Protect the skin around the port site. ? Avoid wearing bra straps that rub or irritate the site. ? Protect the skin around your port from seat belts. Place a soft pad over your chest if needed.  Bathe or shower as told by your health care provider. The site may get wet as long as you are not actively receiving an infusion.  Return to your normal activities as told by your health care provider. Ask your health care provider what activities are safe for you.  Carry a medical alert card or wear a medical alert bracelet at all times. This will let health care providers know that you have an implanted port in case of an emergency.   Get help right away if:  You have redness, swelling, or pain at the port site.  You have fluid or blood coming from your port site.  You have pus or a bad smell coming from the port site.  You have a fever. Summary  Implanted ports are usually placed in the chest for long-term IV access.  Follow instructions from your health care provider about flushing the port and changing bandages (dressings).  Take care of the area around your port by avoiding clothing that puts pressure on the area, and by watching for signs of infection.  Protect the skin around your port from seat belts. Place a soft pad over your chest if needed.  Get help right away if you have a fever or you have redness, swelling, pain, drainage, or a bad smell at the port site. This information is not intended to replace advice given to you by your health care provider. Make sure you discuss any questions you have with your health care provider. Document Revised: 10/29/2019 Document Reviewed: 10/29/2019 Elsevier Patient Education  [place].

## 2020-10-09 ENCOUNTER — Inpatient Hospital Stay: Payer: 59 | Attending: Hematology and Oncology

## 2020-10-09 ENCOUNTER — Inpatient Hospital Stay: Payer: 59

## 2020-10-09 ENCOUNTER — Ambulatory Visit (HOSPITAL_COMMUNITY)
Admission: RE | Admit: 2020-10-09 | Discharge: 2020-10-09 | Disposition: A | Discharge status: Home / Self Care | Payer: 59 | Source: Ambulatory Visit | Attending: Hematology and Oncology | Admitting: Hematology and Oncology

## 2020-10-09 ENCOUNTER — Other Ambulatory Visit: Payer: Self-pay

## 2020-10-09 DIAGNOSIS — C8111 Nodular sclerosis classical Hodgkin lymphoma, lymph nodes of head, face, and neck: Secondary | ICD-10-CM

## 2020-10-09 DIAGNOSIS — T451X5A Adverse effect of antineoplastic and immunosuppressive drugs, initial encounter: Secondary | ICD-10-CM | POA: Insufficient documentation

## 2020-10-09 DIAGNOSIS — C8190 Hodgkin lymphoma, unspecified, unspecified site: Secondary | ICD-10-CM | POA: Insufficient documentation

## 2020-10-09 DIAGNOSIS — K5090 Crohn's disease, unspecified, without complications: Secondary | ICD-10-CM | POA: Insufficient documentation

## 2020-10-09 DIAGNOSIS — G620 Drug-induced polyneuropathy: Secondary | ICD-10-CM | POA: Insufficient documentation

## 2020-10-09 LAB — CBC WITH DIFFERENTIAL (CANCER CENTER ONLY)
Abs Immature Granulocytes: 0.01 10*3/uL (ref 0.00–0.07)
Basophils Absolute: 0.1 10*3/uL (ref 0.0–0.1)
Basophils Relative: 1 %
Eosinophils Absolute: 0.3 10*3/uL (ref 0.0–0.5)
Eosinophils Relative: 4 %
HCT: 42.2 % (ref 39.0–52.0)
Hemoglobin: 14.3 g/dL (ref 13.0–17.0)
Immature Granulocytes: 0 %
Lymphocytes Relative: 21 %
Lymphs Abs: 1.7 10*3/uL (ref 0.7–4.0)
MCH: 30.8 pg (ref 26.0–34.0)
MCHC: 33.9 g/dL (ref 30.0–36.0)
MCV: 90.9 fL (ref 80.0–100.0)
Monocytes Absolute: 1.1 10*3/uL — ABNORMAL HIGH (ref 0.1–1.0)
Monocytes Relative: 13 %
Neutro Abs: 5.1 10*3/uL (ref 1.7–7.7)
Neutrophils Relative %: 61 %
Platelet Count: 243 10*3/uL (ref 150–400)
RBC: 4.64 MIL/uL (ref 4.22–5.81)
RDW: 14.1 % (ref 11.5–15.5)
WBC Count: 8.3 10*3/uL (ref 4.0–10.5)
nRBC: 0 % (ref 0.0–0.2)

## 2020-10-09 LAB — CMP (CANCER CENTER ONLY)
ALT: 20 U/L (ref 0–44)
AST: 22 U/L (ref 15–41)
Albumin: 4.3 g/dL (ref 3.5–5.0)
Alkaline Phosphatase: 52 U/L (ref 38–126)
Anion gap: 11 (ref 5–15)
BUN: 21 mg/dL — ABNORMAL HIGH (ref 6–20)
CO2: 25 mmol/L (ref 22–32)
Calcium: 9.9 mg/dL (ref 8.9–10.3)
Chloride: 104 mmol/L (ref 98–111)
Creatinine: 0.84 mg/dL (ref 0.61–1.24)
GFR, Estimated: >60 mL/min (ref >60)
Glucose, Bld: 97 mg/dL (ref 70–99)
Potassium: 4.2 mmol/L (ref 3.5–5.1)
Sodium: 140 mmol/L (ref 135–145)
Total Bilirubin: 0.7 mg/dL (ref 0.3–1.2)
Total Protein: 7.5 g/dL (ref 6.5–8.1)

## 2020-10-09 LAB — GLUCOSE, CAPILLARY: Glucose-Capillary: 115 mg/dL — ABNORMAL HIGH (ref 70–99)

## 2020-10-09 MED ORDER — FLUDEOXYGLUCOSE F - 18 (FDG) INJECTION
11.3000 | Freq: Once | INTRAVENOUS | Status: AC
  Administered 2020-10-09: 11.71 via INTRAVENOUS

## 2020-10-10 ENCOUNTER — Encounter: Payer: Self-pay | Admitting: Hematology and Oncology

## 2020-10-10 ENCOUNTER — Inpatient Hospital Stay (HOSPITAL_BASED_OUTPATIENT_CLINIC_OR_DEPARTMENT_OTHER): Payer: 59 | Admitting: Hematology and Oncology

## 2020-10-10 ENCOUNTER — Other Ambulatory Visit: Payer: Self-pay | Admitting: Hematology and Oncology

## 2020-10-10 ENCOUNTER — Telehealth: Payer: Self-pay | Admitting: Hematology and Oncology

## 2020-10-10 DIAGNOSIS — G620 Drug-induced polyneuropathy: Secondary | ICD-10-CM

## 2020-10-10 DIAGNOSIS — K50919 Crohn's disease, unspecified, with unspecified complications: Secondary | ICD-10-CM | POA: Diagnosis not present

## 2020-10-10 DIAGNOSIS — C8111 Nodular sclerosis classical Hodgkin lymphoma, lymph nodes of head, face, and neck: Secondary | ICD-10-CM

## 2020-10-10 DIAGNOSIS — C8190 Hodgkin lymphoma, unspecified, unspecified site: Secondary | ICD-10-CM | POA: Diagnosis present

## 2020-10-10 DIAGNOSIS — K5090 Crohn's disease, unspecified, without complications: Secondary | ICD-10-CM | POA: Diagnosis not present

## 2020-10-10 DIAGNOSIS — T451X5A Adverse effect of antineoplastic and immunosuppressive drugs, initial encounter: Secondary | ICD-10-CM

## 2020-10-10 MED ORDER — PREDNISONE 20 MG PO TABS
10.0000 mg | ORAL_TABLET | Freq: Every day | ORAL | 1 refills | Status: DC
Start: 2020-10-10 — End: 2021-04-11

## 2020-10-10 NOTE — Telephone Encounter (Signed)
Scheduled appt per 4/15 sch msg. Pt aware.

## 2020-10-10 NOTE — Assessment & Plan Note (Signed)
I have reviewed plan of care with the patient I gave him copies of test results and reviewed PET CT scan with the patient The patient has achieved complete response to therapy He had early stage disease that is considered cured from chemotherapy I recommend port removal I reviewed with him current NCCN guidelines I plan to see him every 3 to 6 months for the first year of visit We discussed the importance of annual influenza vaccination Given normal imaging study, I am not in favor of routine surveillance imaging I educated the patient signs and symptoms to watch out for cancer relapse

## 2020-10-10 NOTE — Assessment & Plan Note (Signed)
I recommend the patient to reach out to his GI doctor for further follow-up I do not recommend him to return back to [facility] due to association with lymphoma

## 2020-10-10 NOTE — Assessment & Plan Note (Signed)
He has slight residual neuropathy from treatment I anticipate it will improve and heal in time Observe closely for now

## 2020-10-10 NOTE — Progress Notes (Signed)
[facility] OFFICE PROGRESS NOTE  Patient Care Team: [name], MD as PCP - General (Family Medicine) [name], MD as Attending Physician (Family Medicine) [name], MD (Inactive) (Gastroenterology) [name], MD as Consulting Physician (Gastroenterology)  ASSESSMENT & PLAN:  Hodgkin lymphoma [facility]) I have reviewed plan of care with the patient I gave him copies of test results and reviewed PET CT scan with the patient The patient has achieved complete response to therapy He had early stage disease that is considered cured from chemotherapy I recommend port removal I reviewed with him current NCCN guidelines I plan to see him every 3 to 6 months for the first year of visit We discussed the importance of annual influenza vaccination Given normal imaging study, I am not in favor of routine surveillance imaging I educated the patient signs and symptoms to watch out for cancer relapse  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 IR REMOVAL TUN ACCESS W/ PORT W/O FL MOD SED    Standing Status:   Future    Standing Expiration Date:   10/10/2021    Order Specific Question:   Reason for exam:    Answer:   chemo is completed    Order Specific Question:   Preferred Imaging Location?    Answer:   [facility]    All questions were answered. The patient knows to call the clinic with any problems, questions or concerns. The total time spent in the appointment was 20 minutes encounter with patients including review of chart and various tests results, discussions about plan of care and coordination of  care plan   [name], MD 10/10/2020 11:42 AM  INTERVAL HISTORY: Please see below for problem oriented charting. He returns to review test results He is doing well He continues to have mild loose stool He is maintained on 10 mg of prednisone daily He has very mild residual neuropathy from treatment  SUMMARY OF ONCOLOGIC HISTORY: Oncology History  Hodgkin lymphoma ([place])  03/10/2020 Pathology Results   A. LYMPH NODE, RIGHT SUBMANDIBULAR, BIOPSY:  -  Classic Hodgkin lymphoma  -  See comment   COMMENT:   The submitted lymph nodes are effaced by a vaguely nodular lymphoid proliferation separated by bands of fibrosis with admixed inflammatory cells including neutrophils and eosinophils. The lymphoid proliferation  is composed predominantly of small mature lymphocytes with scattered large atypical cells. The atypical cells have mono and multi-lobated nuclei and prominent nucleoli characteristic of Reed-Sternberg cells.  Lacunar cells are also present.   The neoplastic cells are CD30, CD15 (subset), Pax-5 (dim) and CD20 positive by immunohistochemistry. EBV by in-situ hybridization is positive in the Reed-Sternberg cells. CD3 highlights the background  T-cells.  EBV in-situ hybridization is negative.   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    03/14/2020 Initial Diagnosis   Hodgkin lymphoma ([place])   03/14/2020 Cancer Staging   Staging form: Hodgkin and Non-Hodgkin Lymphoma, AJCC 8th Edition - Clinical stage from 03/14/2020: Stage I (Hodgkin lymphoma, B - Symptoms) - Signed by [name], MD on 03/14/2020  03/18/2020 Echocardiogram   1. Left ventricular ejection fraction, by estimation, is 55 to 60%. The left ventricle has normal function. The left ventricle has no regional wall motion abnormalities. Left ventricular diastolic parameters were normal. The average left ventricular global  longitudinal strain is -15.9 %. The global longitudinal strain is normal.  2. Right ventricular systolic function is normal. The right ventricular size is normal. There is normal pulmonary artery systolic pressure.  3. The mitral valve is normal in structure. No evidence of mitral valve regurgitation. No evidence of mitral stenosis.  4. The aortic valve is normal in structure. Aortic valve regurgitation is not visualized. No aortic stenosis is present.  5. The inferior vena cava is normal in size with greater than 50% respiratory variability, suggesting right atrial pressure of 3 mmHg.   03/20/2020 Procedure   Placement of a subcutaneous port device. Catheter tip at the superior cavoatrial junction.   03/24/2020 - 09/08/2020 Chemotherapy   The patient had ABVD for chemotherapy treatment.     05/16/2020 PET scan   1. Small hypermetabolic RIGHT level II lymph node is indeterminate. Activity is similar to liver activity. No comparison available. 2. Reduction in size and no significant metabolic activity of RIGHT supraclavicular lymph node ( [place] 1). 3. No evidence lymphoma chest, abdomen pelvis.  Normal spleen.  4. Intense metabolic activity localizing to LEFT paraspinal musculature in the cervical neck. Favor benign physiologic muscle musculature in the LEFT neck. Recommend clinical correlation for muscle spasm or trauma. If concern for unlikely muscular lymphoma, consider contrast CT or MRI of the neck.   06/10/2020 Echocardiogram    1. Left ventricular ejection fraction, by estimation, is 55 to 60%. The left ventricle has normal function. The left ventricle has no regional wall motion abnormalities. Left ventricular diastolic parameters were normal. The average left ventricular global longitudinal strain is -21.6 %. The global longitudinal strain is normal.  2. Right ventricular systolic function is normal. The right ventricular size is normal. Tricuspid regurgitation signal is inadequate for  assessing PA pressure.  3. The mitral valve is normal in structure. Trivial mitral valve regurgitation. No evidence of mitral stenosis.  4. The aortic valve is tricuspid. Aortic valve regurgitation is not visualized. No aortic stenosis is present.     10/10/2020 PET scan   Negative PET-CT for metabolically active lymphoma.  ([place] 1)       REVIEW OF SYSTEMS:   Constitutional: Denies fevers, chills or abnormal weight loss Eyes: Denies blurriness of vision Ears, nose, mouth, throat, and face: Denies mucositis or sore throat Respiratory: Denies cough, dyspnea or wheezes Cardiovascular: Denies palpitation, chest discomfort or lower extremity swelling Skin: Denies abnormal skin rashes Lymphatics: Denies new lymphadenopathy or easy bruising Neurological:Denies numbness, tingling or new weaknesses Behavioral/Psych: Mood is stable, no new changes  All other systems were reviewed with the patient and are negative.  I have reviewed the past medical history, past surgical history, social history and family history with the patient and they are unchanged from previous note.  ALLERGIES:  is allergic to amoxicillin.  MEDICATIONS:  Current Outpatient Medications  Medication Sig Dispense Refill  . acetaminophen (TYLENOL) 500 MG tablet Take 1,000 mg by mouth every 6 (six) hours as needed for moderate pain or headache.    . Carboxymethylcellul-Glycerin (LUBRICATING EYE DROPS OP) Place 1 drop into both eyes daily as needed (dry eyes).    . Cholecalciferol (VITAMIN D) 50 MCG (2000 UT) tablet Take 6,000 Units by mouth daily.    Marland Kitchen  EPINEPHrine 0.3 mg/0.3 mL IJ SOAJ injection Inject 0.3 mg into the muscle as needed for anaphylaxis.    . Magnesium 250 MG TABS Take 250 mg by mouth daily.    . Multiple Vitamins-Minerals (ZINC PO) Take 2 tablets by mouth daily. With quercetin    . OVER THE COUNTER MEDICATION Take 2 capsules by mouth at bedtime as needed (sleep). Stress Relief otc supplement    .  oxymetazoline (AFRIN) 0.05 % nasal spray Place 1 spray into both nostrils 2 (two) times daily as needed for congestion.    . polyethylene glycol (MIRALAX / GLYCOLAX) 17 g packet Take 17 g by mouth daily.    . predniSONE (DELTASONE) 20 MG tablet Take 0.5 tablets (10 mg total) by mouth daily with breakfast. 60 tablet 1  . SUPER B COMPLEX/C PO Take 1 tablet by mouth daily.    . TURMERIC PO Take 2 capsules by mouth daily.     No current facility-administered medications for this visit.    PHYSICAL EXAMINATION: ECOG PERFORMANCE STATUS: 0 - Asymptomatic  Vitals:   10/10/20 1131  BP: 118/80  Pulse: 80  Resp: 18  Temp: 97.7 F (36.5 C)  SpO2: 100%   Filed Weights   10/10/20 1131  Weight: 224 lb 6.4 oz (101.8 kg)    GENERAL:alert, no distress and comfortable NEURO: alert & oriented x 3 with fluent speech, no focal motor/sensory deficits  LABORATORY DATA:  I have reviewed the data as listed    Component Value Date/Time   NA 140 10/09/2020 1005   NA 141 04/27/2013 1217   K 4.2 10/09/2020 1005   K 4.0 04/27/2013 1217   CL 104 10/09/2020 1005   CL 106 04/26/2012 1550   CO2 25 10/09/2020 1005   CO2 28 04/27/2013 1217   GLUCOSE 97 10/09/2020 1005   GLUCOSE 92 04/27/2013 1217   GLUCOSE 108 (H) 04/26/2012 1550   BUN 21 (H) 10/09/2020 1005   BUN 10.2 04/27/2013 1217   CREATININE 0.84 10/09/2020 1005   CREATININE 0.8 04/27/2013 1217   CALCIUM 9.9 10/09/2020 1005   CALCIUM 9.7 04/27/2013 1217   PROT 7.5 10/09/2020 1005   PROT 7.5 04/27/2013 1217   ALBUMIN 4.3 10/09/2020 1005   ALBUMIN 3.6 04/27/2013 1217   AST 22 10/09/2020 1005   AST 14 04/27/2013 1217   ALT 20 10/09/2020 1005   ALT 23 04/27/2013 1217   ALKPHOS 52 10/09/2020 1005   ALKPHOS 64 04/27/2013 1217   BILITOT 0.7 10/09/2020 1005   BILITOT 0.86 04/27/2013 1217   GFRNONAA >60 10/09/2020 1005   GFRAA >60 03/24/2020 0853    No results found for: SPEP, UPEP  Lab Results  Component Value Date   WBC 8.3 10/09/2020    NEUTROABS 5.1 10/09/2020   HGB 14.3 10/09/2020   HCT 42.2 10/09/2020   MCV 90.9 10/09/2020   PLT 243 10/09/2020      Chemistry      Component Value Date/Time   NA 140 10/09/2020 1005   NA 141 04/27/2013 1217   K 4.2 10/09/2020 1005   K 4.0 04/27/2013 1217   CL 104 10/09/2020 1005   CL 106 04/26/2012 1550   CO2 25 10/09/2020 1005   CO2 28 04/27/2013 1217   BUN 21 (H) 10/09/2020 1005   BUN 10.2 04/27/2013 1217   CREATININE 0.84 10/09/2020 1005   CREATININE 0.8 04/27/2013 1217      Component Value Date/Time   CALCIUM 9.9 10/09/2020 1005   CALCIUM 9.7 04/27/2013  1217   ALKPHOS 52 10/09/2020 1005   ALKPHOS 64 04/27/2013 1217   AST 22 10/09/2020 1005   AST 14 04/27/2013 1217   ALT 20 10/09/2020 1005   ALT 23 04/27/2013 1217   BILITOT 0.7 10/09/2020 1005   BILITOT 0.86 04/27/2013 1217       RADIOGRAPHIC STUDIES: I have reviewed multiple imaging studies with the patient I have personally reviewed the radiological images as listed and agreed with the findings in the report. NM PET Image Restage (PS) Skull Base to Thigh  Result Date: 10/10/2020 CLINICAL DATA:  Subsequent treatment strategy for Hodgkin's lymphoma. EXAM: NUCLEAR MEDICINE PET SKULL BASE TO THIGH TECHNIQUE: 11.71 mCi F-18 FDG was injected intravenously. Full-ring PET imaging was performed from the skull base to thigh after the radiotracer. CT data was obtained and used for attenuation correction and anatomic localization. Fasting blood glucose: 115 mg/dl COMPARISON:  PET-CT 05/16/2020 FINDINGS: Mediastinal blood pool activity: SUV max 2.12 Liver activity: SUV max 2.99 NECK: Small bilateral neck nodes. No residual lymphadenopathy. No areas of hypermetabolism. Mild hypermetabolism is noted in the neck muscles but this appears fairly symmetric. Incidental CT findings: none CHEST: No axillary, mediastinal or hilar lymphadenopathy. No pulmonary lesions. No pleural effusions or pleural lesions. Incidental CT findings: none  ABDOMEN/PELVIS: No abnormal hypermetabolic activity within the liver, pancreas, adrenal glands, or spleen. No hypermetabolic lymph nodes in the abdomen or pelvis. Normal sized spleen. No inguinal adenopathy. Incidental CT findings: none SKELETON: No focal hypermetabolic activity to suggest skeletal lymphoma. Incidental CT findings: none IMPRESSION: Negative PET-CT for metabolically active lymphoma.  ([place] 1) Electronically Signed   By: Marijo Sanes M.D.   On: 10/10/2020 09:49

## 2020-10-31 ENCOUNTER — Other Ambulatory Visit: Payer: Self-pay | Admitting: Student

## 2020-11-03 ENCOUNTER — Encounter (HOSPITAL_COMMUNITY): Payer: Self-pay

## 2020-11-03 ENCOUNTER — Ambulatory Visit (HOSPITAL_COMMUNITY)
Admission: RE | Admit: 2020-11-03 | Discharge: 2020-11-03 | Disposition: A | Discharge status: Home / Self Care | Payer: 59 | Source: Ambulatory Visit | Attending: Hematology and Oncology | Admitting: Hematology and Oncology

## 2020-11-03 ENCOUNTER — Other Ambulatory Visit: Payer: Self-pay

## 2020-11-03 DIAGNOSIS — C8111 Nodular sclerosis classical Hodgkin lymphoma, lymph nodes of head, face, and neck: Secondary | ICD-10-CM | POA: Diagnosis not present

## 2020-11-03 DIAGNOSIS — K50919 Crohn's disease, unspecified, with unspecified complications: Secondary | ICD-10-CM | POA: Diagnosis not present

## 2020-11-03 DIAGNOSIS — Z452 Encounter for adjustment and management of vascular access device: Secondary | ICD-10-CM | POA: Diagnosis not present

## 2020-11-03 HISTORY — PX: IR REMOVAL TUN ACCESS W/ PORT W/O FL MOD SED: IMG2290

## 2020-11-03 MED ORDER — LIDOCAINE-EPINEPHRINE 1 %-1:100000 IJ SOLN
INTRAMUSCULAR | Status: AC
  Filled 2020-11-03: qty 1

## 2020-11-03 MED ORDER — LIDOCAINE HCL 1 % IJ SOLN
INTRAMUSCULAR | Status: AC | PRN
  Administered 2020-11-03: 10 mL via INTRADERMAL

## 2020-11-03 MED ORDER — SODIUM CHLORIDE 0.9 % IV SOLN: INTRAVENOUS | Status: DC

## 2020-11-03 NOTE — Progress Notes (Signed)
Patient ID: Mark Decker, male   DOB: 1995/11/02, 25 y.o.   MRN: [id number] Patient presents today for Port-A-Cath removal.  He has a history of Hodgkin's lymphoma and has completed chemotherapy.  Details/risks of procedure, including but not limited to, internal bleeding, infection, injury to adjacent structures discussed with patient with his understanding and consent.  He does not wish to receive IV conscious sedation for the procedure.

## 2020-11-03 NOTE — Procedures (Signed)
Pre Procedural Dx: Poor venous access Post Procedural Dx: Same  Successful removal of anterior chest wall port-a-cath.  EBL: Minimal  No immediate post procedural complications.   Ronny Bacon, MD Pager #: [phone]

## 2020-11-03 NOTE — Discharge Instructions (Signed)
Interventional radiology phone numbers [phone] After hours [phone]  Implanted Port Removal, Care After This sheet gives you information about how to care for yourself after your procedure. Your health care provider may also give you more specific instructions. If you have problems or questions, contact your health care provider. What can I expect after the procedure? After the procedure, it is common to have:  Soreness or pain near your incision.  Some swelling or bruising near your incision. Follow these instructions at home: Medicines  Take over-the-counter and prescription medicines only as told by your health care provider.  If you were prescribed an antibiotic medicine, take it as told by your health care provider. Do not stop taking the antibiotic even if you start to feel better. Bathing 1. Do not take baths, swim, or use a hot tub until your health care provider approves. You may shower tomorrow. Remove your dressing prior to your shower. Incision care 1. Follow instructions from your health care provider about how to take care of your incision. Make sure you: ? Wash your hands with soap and water before you change your bandage (dressing). If soap and water are not available, use hand sanitizer. ? Keep your dressing dry. ? Leave  skin glue in place. These skin closures may need to stay in place for 2 weeks or longer. . 2. Check your incision area every day for signs of infection. Check for: ? More redness, swelling, or pain. ? More fluid or blood. ? Warmth. ? Pus or a bad smell.   Driving  Do not drive for 24 hours if you were given a medicine to help you relax (sedative) during your procedure.  If you did not receive a sedative, ask your health care provider when it is safe to drive.   Activity  Return to your normal activities as told by your health care provider. Ask your health care provider what activities are safe for you.  Do not lift anything that is  heavier than 10 lb (4.5 kg), or the limit that you are told, until your health care provider says that it is safe.  Do not do activities that involve lifting your arms over your head. General instructions  Do not use any products that contain nicotine or tobacco, such as cigarettes and e-cigarettes. These can delay healing. If you need help quitting, ask your health care provider.  Keep all follow-up visits as told by your health care provider. This is important. Contact a health care provider if:  You have more redness, swelling, or pain around your incision.  You have more fluid or blood coming from your incision.  Your incision feels warm to the touch.  You have pus or a bad smell coming from your incision.  You have pain that is not relieved by your pain medicine. Get help right away if you have:  A fever or chills.  Chest pain.  Difficulty breathing. Summary  After the procedure, it is common to have pain, soreness, swelling, or bruising near your incision.  If you were prescribed an antibiotic medicine, take it as told by your health care provider. Do not stop taking the antibiotic even if you start to feel better.  Do not drive for 24 hours if you were given a sedative during your procedure.  Return to your normal activities as told by your health care provider. Ask your health care provider what activities are safe for you. This information is not intended to replace advice given to  you by your health care provider. Make sure you discuss any questions you have with your health care provider. Document Revised: 07/28/2017 Document Reviewed: 07/28/2017 Elsevier Patient Education  2021 [employer].

## 2020-11-03 NOTE — Progress Notes (Signed)
Patient returns from radiology. Did not get sedation for PAC removal as he ate today.

## 2020-11-27 ENCOUNTER — Telehealth: Payer: Self-pay

## 2020-11-27 ENCOUNTER — Other Ambulatory Visit: Payer: Self-pay

## 2020-11-27 DIAGNOSIS — K50919 Crohn's disease, unspecified, with unspecified complications: Secondary | ICD-10-CM

## 2020-11-27 DIAGNOSIS — C8111 Nodular sclerosis classical Hodgkin lymphoma, lymph nodes of head, face, and neck: Secondary | ICD-10-CM

## 2020-11-27 DIAGNOSIS — T451X5A Adverse effect of antineoplastic and immunosuppressive drugs, initial encounter: Secondary | ICD-10-CM

## 2020-11-27 DIAGNOSIS — D701 Agranulocytosis secondary to cancer chemotherapy: Secondary | ICD-10-CM

## 2020-11-27 NOTE — Telephone Encounter (Signed)
Referral sent and called him back and told referral sent. Told to expect a call from the covid clinic. He verbalized understanding.

## 2020-11-27 NOTE — Telephone Encounter (Signed)
He called and left a message to call him. He was exposed to covid Monday and tested negative. Yesterday he started having a sore throat. Today he tested positive for covid at home. He is having left sided lower chest pain with a deep breath. He called Dr. Michail Sermon and was told to hydrate and call [name]. He did not get the covid vaccine. He is interested in going to the covid clinic.

## 2020-11-27 NOTE — Telephone Encounter (Signed)
Pls send referral to [facility] [place] clinic

## 2020-11-28 ENCOUNTER — Telehealth: Payer: Self-pay | Admitting: Physician Assistant

## 2020-11-28 NOTE — Telephone Encounter (Signed)
Called to discuss with patient about COVID-19 symptoms and the use of one of the available treatments for those with mild to moderate Covid symptoms and at a high risk of hospitalization.  Pt appears to qualify for outpatient treatment due to co-morbid conditions and/or a member of an at-risk group in accordance with the FDA Emergency Use Authorization.    Symptom onset: 6/1 Vaccinated: no Booster? no Immunocompromised? Yes, lymphoma and Ulcerative colitis Qualifiers: above NIH Criteria: 1  Pt very appreciative of call. I offered him either paxlovid with a discussion of possible cutting his current steroid dose (prednisone 82m) in half while taking pax or Bebtelivomab. He will call his insurance company and see what it might cost him and call [employer]. He has the hotline #   KAngelena Form

## 2020-12-15 ENCOUNTER — Other Ambulatory Visit (HOSPITAL_COMMUNITY): Payer: Self-pay | Admitting: Radiology

## 2020-12-15 ENCOUNTER — Ambulatory Visit (HOSPITAL_COMMUNITY)
Admission: RE | Admit: 2020-12-15 | Discharge: 2020-12-15 | Disposition: A | Discharge status: Home / Self Care | Payer: 59 | Source: Ambulatory Visit | Attending: Hematology and Oncology | Admitting: Hematology and Oncology

## 2020-12-15 ENCOUNTER — Telehealth: Payer: Self-pay

## 2020-12-15 ENCOUNTER — Other Ambulatory Visit: Payer: Self-pay | Admitting: Hematology and Oncology

## 2020-12-15 ENCOUNTER — Other Ambulatory Visit: Payer: Self-pay

## 2020-12-15 ENCOUNTER — Encounter (HOSPITAL_COMMUNITY): Payer: Self-pay

## 2020-12-15 ENCOUNTER — Other Ambulatory Visit: Payer: Self-pay | Admitting: Radiology

## 2020-12-15 DIAGNOSIS — C8111 Nodular sclerosis classical Hodgkin lymphoma, lymph nodes of head, face, and neck: Secondary | ICD-10-CM

## 2020-12-15 MED ORDER — SULFAMETHOXAZOLE-TRIMETHOPRIM 800-160 MG PO TABS: 1.0000 | ORAL_TABLET | Freq: Two times a day (BID) | ORAL | 0 refills | Status: AC

## 2020-12-15 NOTE — Progress Notes (Signed)
25 y.o. male outpatient. History of Hodgkin's Lymphoma. Completed chemotherapy s/p portacath removal by IR on 5.9.22. Patient presents to IR department with retained suture. Patient reports since the removal he has had  purulent output with erythema that has since resolved. He denies any signs of infection at this time including fever and chills. The area was cleaned and the retained suture was removed intact. Since [name] is allergic to penicillin a prescription for bactrim was sent into his pharmacy for prophylatic antibiotic treatment.  Patient instructed to contact IR should any signs of infection occur.  Mr Mark Decker verbalized understanding and is in agreement with the plan of care.

## 2020-12-15 NOTE — Telephone Encounter (Signed)
Called and told him IR will call with appt. He verbalized understanding.

## 2020-12-15 NOTE — Telephone Encounter (Signed)
He called and left a message to call him.  Returned call. He had port removed on 5/9. He has a suture that hanging out of port site. He attempted to remove it but left it in place. It started looking infected. He had yellow drainage at the suture site. He drained to yellow drainage and placed a Band-Aid over the site. He concerned about the port site. He will send a picture on mychart.

## 2020-12-15 NOTE — Telephone Encounter (Signed)
Let's call IR for eval ASAP

## 2021-04-11 ENCOUNTER — Inpatient Hospital Stay (HOSPITAL_COMMUNITY)
Admission: EM | Admit: 2021-04-11 | Discharge: 2021-04-12 | DRG: 387 | Disposition: A | Discharge status: Home / Self Care | Payer: 59 | Attending: Internal Medicine | Admitting: Internal Medicine

## 2021-04-11 ENCOUNTER — Other Ambulatory Visit: Payer: Self-pay

## 2021-04-11 ENCOUNTER — Encounter (HOSPITAL_COMMUNITY): Payer: Self-pay

## 2021-04-11 ENCOUNTER — Emergency Department (HOSPITAL_COMMUNITY): Payer: 59

## 2021-04-11 DIAGNOSIS — K50811 Crohn's disease of both small and large intestine with rectal bleeding: Secondary | ICD-10-CM | POA: Diagnosis present

## 2021-04-11 DIAGNOSIS — K50911 Crohn's disease, unspecified, with rectal bleeding: Secondary | ICD-10-CM

## 2021-04-11 DIAGNOSIS — Z8572 Personal history of non-Hodgkin lymphomas: Secondary | ICD-10-CM | POA: Diagnosis not present

## 2021-04-11 DIAGNOSIS — G620 Drug-induced polyneuropathy: Secondary | ICD-10-CM | POA: Diagnosis present

## 2021-04-11 DIAGNOSIS — K50111 Crohn's disease of large intestine with rectal bleeding: Secondary | ICD-10-CM

## 2021-04-11 DIAGNOSIS — T451X5S Adverse effect of antineoplastic and immunosuppressive drugs, sequela: Secondary | ICD-10-CM | POA: Diagnosis not present

## 2021-04-11 DIAGNOSIS — Z20822 Contact with and (suspected) exposure to covid-19: Secondary | ICD-10-CM | POA: Diagnosis present

## 2021-04-11 DIAGNOSIS — Z87892 Personal history of anaphylaxis: Secondary | ICD-10-CM | POA: Diagnosis not present

## 2021-04-11 DIAGNOSIS — Z8379 Family history of other diseases of the digestive system: Secondary | ICD-10-CM

## 2021-04-11 DIAGNOSIS — K5010 Crohn's disease of large intestine without complications: Secondary | ICD-10-CM | POA: Diagnosis present

## 2021-04-11 DIAGNOSIS — Z79899 Other long term (current) drug therapy: Secondary | ICD-10-CM | POA: Diagnosis not present

## 2021-04-11 DIAGNOSIS — Z800 Family history of malignant neoplasm of digestive organs: Secondary | ICD-10-CM | POA: Diagnosis not present

## 2021-04-11 DIAGNOSIS — Z880 Allergy status to penicillin: Secondary | ICD-10-CM | POA: Diagnosis not present

## 2021-04-11 HISTORY — DX: Crohn's disease of large intestine without complications: K50.10

## 2021-04-11 LAB — POC OCCULT BLOOD, ED: Fecal Occult Bld: POSITIVE — AB

## 2021-04-11 LAB — CBC WITH DIFFERENTIAL/PLATELET
Abs Immature Granulocytes: 0.04 10*3/uL (ref 0.00–0.07)
Basophils Absolute: 0.1 10*3/uL (ref 0.0–0.1)
Basophils Relative: 1 %
Eosinophils Absolute: 0.5 10*3/uL (ref 0.0–0.5)
Eosinophils Relative: 4 %
HCT: 42.9 % (ref 39.0–52.0)
Hemoglobin: 14.4 g/dL (ref 13.0–17.0)
Immature Granulocytes: 0 %
Lymphocytes Relative: 14 %
Lymphs Abs: 1.6 10*3/uL (ref 0.7–4.0)
MCH: 30.3 pg (ref 26.0–34.0)
MCHC: 33.6 g/dL (ref 30.0–36.0)
MCV: 90.1 fL (ref 80.0–100.0)
Monocytes Absolute: 1.5 10*3/uL — ABNORMAL HIGH (ref 0.1–1.0)
Monocytes Relative: 13 %
Neutro Abs: 7.6 10*3/uL (ref 1.7–7.7)
Neutrophils Relative %: 68 %
Platelets: 272 10*3/uL (ref 150–400)
RBC: 4.76 MIL/uL (ref 4.22–5.81)
RDW: 12.7 % (ref 11.5–15.5)
WBC: 11.3 10*3/uL — ABNORMAL HIGH (ref 4.0–10.5)
nRBC: 0 % (ref 0.0–0.2)

## 2021-04-11 LAB — COMPREHENSIVE METABOLIC PANEL
ALT: 17 U/L (ref 0–44)
AST: 18 U/L (ref 15–41)
Albumin: 4.2 g/dL (ref 3.5–5.0)
Alkaline Phosphatase: 58 U/L (ref 38–126)
Anion gap: 7 (ref 5–15)
BUN: 18 mg/dL (ref 6–20)
CO2: 26 mmol/L (ref 22–32)
Calcium: 9.3 mg/dL (ref 8.9–10.3)
Chloride: 103 mmol/L (ref 98–111)
Creatinine, Ser: 0.93 mg/dL (ref 0.61–1.24)
GFR, Estimated: >60 mL/min (ref >60)
Glucose, Bld: 80 mg/dL (ref 70–99)
Potassium: 3.9 mmol/L (ref 3.5–5.1)
Sodium: 136 mmol/L (ref 135–145)
Total Bilirubin: 0.6 mg/dL (ref 0.3–1.2)
Total Protein: 7.4 g/dL (ref 6.5–8.1)

## 2021-04-11 LAB — ABO/RH: ABO/RH(D): A POS

## 2021-04-11 LAB — HEMOGLOBIN AND HEMATOCRIT, BLOOD
HCT: 43.2 % (ref 39.0–52.0)
Hemoglobin: 14.6 g/dL (ref 13.0–17.0)

## 2021-04-11 LAB — RESP PANEL BY RT-PCR (FLU A&B, COVID) ARPGX2
Influenza A by PCR: NEGATIVE
Influenza B by PCR: NEGATIVE
SARS Coronavirus 2 by RT PCR: NEGATIVE

## 2021-04-11 LAB — TYPE AND SCREEN
ABO/RH(D): A POS
Antibody Screen: NEGATIVE

## 2021-04-11 MED ORDER — VITAMIN D 25 MCG (1000 UNIT) PO TABS
6000.0000 [IU] | ORAL_TABLET | Freq: Every day | ORAL | Status: DC
  Administered 2021-04-11 – 2021-04-12 (×2): 6000 [IU] via ORAL
  Filled 2021-04-11 (×2): qty 6

## 2021-04-11 MED ORDER — INFLUENZA VAC SPLIT QUAD 0.5 ML IM SUSY: 0.5000 mL | PREFILLED_SYRINGE | INTRAMUSCULAR | Status: DC

## 2021-04-11 MED ORDER — OXYMETAZOLINE HCL 0.05 % NA SOLN
1.0000 | Freq: Two times a day (BID) | NASAL | Status: DC | PRN
  Filled 2021-04-11: qty 15

## 2021-04-11 MED ORDER — METHYLPREDNISOLONE SODIUM SUCC 125 MG IJ SOLR
60.0000 mg | Freq: Two times a day (BID) | INTRAMUSCULAR | Status: DC
  Administered 2021-04-11 – 2021-04-12 (×3): 60 mg via INTRAVENOUS
  Filled 2021-04-11 (×3): qty 2

## 2021-04-11 MED ORDER — IOHEXOL 350 MG/ML SOLN
100.0000 mL | Freq: Once | INTRAVENOUS | Status: AC | PRN
  Administered 2021-04-11: 100 mL via INTRAVENOUS

## 2021-04-11 MED ORDER — MORPHINE SULFATE (PF) 4 MG/ML IV SOLN
4.0000 mg | Freq: Once | INTRAVENOUS | Status: AC
Start: 2021-04-11 — End: 2021-04-11
  Administered 2021-04-11: 4 mg via INTRAVENOUS
  Filled 2021-04-11: qty 1

## 2021-04-11 MED ORDER — SODIUM CHLORIDE 0.9 % IV BOLUS
1000.0000 mL | Freq: Once | INTRAVENOUS | Status: AC
  Administered 2021-04-11: 1000 mL via INTRAVENOUS

## 2021-04-11 MED ORDER — SODIUM CHLORIDE 0.9 % IV SOLN: Freq: Once | INTRAVENOUS | Status: AC

## 2021-04-11 MED ORDER — ACETAMINOPHEN 500 MG PO TABS: 1000.0000 mg | ORAL_TABLET | Freq: Four times a day (QID) | ORAL | Status: DC | PRN

## 2021-04-11 MED ORDER — PROCHLORPERAZINE EDISYLATE 10 MG/2ML IJ SOLN: 10.0000 mg | Freq: Four times a day (QID) | INTRAMUSCULAR | Status: DC | PRN

## 2021-04-11 MED ORDER — PHENOL 1.4 % MT LIQD
1.0000 | OROMUCOSAL | Status: DC | PRN
  Filled 2021-04-11: qty 177

## 2021-04-11 MED ORDER — POLYVINYL ALCOHOL 1.4 % OP SOLN
1.0000 [drp] | Freq: Three times a day (TID) | OPHTHALMIC | Status: DC | PRN
  Filled 2021-04-11: qty 15

## 2021-04-11 MED ORDER — ONDANSETRON HCL 4 MG/2ML IJ SOLN
4.0000 mg | Freq: Once | INTRAMUSCULAR | Status: AC
  Administered 2021-04-11: 4 mg via INTRAVENOUS
  Filled 2021-04-11: qty 2

## 2021-04-11 MED ORDER — PANTOPRAZOLE SODIUM 40 MG PO TBEC
40.0000 mg | DELAYED_RELEASE_TABLET | Freq: Every day | ORAL | Status: DC
  Administered 2021-04-11 – 2021-04-12 (×2): 40 mg via ORAL
  Filled 2021-04-11 (×2): qty 1

## 2021-04-11 MED ORDER — MORPHINE SULFATE (PF) 4 MG/ML IV SOLN
4.0000 mg | INTRAVENOUS | Status: DC | PRN
  Administered 2021-04-11 (×2): 4 mg via INTRAVENOUS
  Filled 2021-04-11 (×2): qty 1

## 2021-04-11 MED ORDER — ONDANSETRON 4 MG PO TBDP: 4.0000 mg | ORAL_TABLET | Freq: Once | ORAL | Status: DC

## 2021-04-11 NOTE — ED Provider Notes (Addendum)
Emergency Medicine Provider Triage Evaluation Note  Mark Decker , a 25 y.o. male  was evaluated in triage.  Pt complains of rectal bleeding and abdominal pain for the past 5 days, but this Crohn's flare has bene for the past month. Three weeks ago prior to the initial start was his last flare up. Denies any vomiting, but mentions he is nauseous. He mentions he is "peeing blood out of his butt". Additionally, mentions fatigue.  Review of Systems  Positive: Abdominal pain, nausea, rectal bleeding, hematochezia, fatigue Negative: Fevers, vomiting, dysuria, hematuria, penile discharge  Physical Exam  BP 135/85 (BP Location: Right Arm)   Pulse 85   Temp 98.5 F (36.9 C) (Oral)   Resp 18   SpO2 98%  Gen:   Awake, no distress   Resp:  Normal effort  MSK:   Moves extremities without difficulty  Other:  Belly soft. RLQ tenderness to palpation. No peritoneal signs. Rectal exam deferred.   Medical Decision Making  Medically screening exam initiated at 9:37 AM.  Appropriate orders placed.  Jedd Schulenburg was informed that the remainder of the evaluation will be completed by another provider, this initial triage assessment does not replace that evaluation, and the importance of remaining in the ED until their evaluation is complete.  Labs and imaging ordered   Sherrell Puller, PA-C 04/11/21 1000    Sherrell Puller, PA-C 04/11/21 1541    Wyvonnia Dusky, MD 04/12/21 [phone]

## 2021-04-11 NOTE — ED Notes (Signed)
Pt to 1605

## 2021-04-11 NOTE — H&P (Addendum)
History and Physical    Mark Decker IDP:[id number] DOB: April 21, 1996 DOA: 04/11/2021  PCP: [facility], [name]  Patient coming from: Home  Chief Complaint: abdominal pain, rectal bleeding  HPI: Mark Decker is a 25 y.o. male with medical history significant of Crohn's, Hodgkin's lymphoma. Presenting with abdominal pain and rectal bleeding. He reports that he had a Crohn's flare about 3 weeks ago. He was seen by [facility] and was started on steroids enemas. His symptoms improved and he returned to his baseline health status. He was told if his symptoms returned, he would need to go the ED. He continued to take his mesalamine as directed. He reports that 2 days ago, he started having lower abdominal pain and rectal bleeding. He pain was crampy and episodic. He had dark blood and black liquid stool. He felt as if he would "peeing" from his rectum. He reports during this team he felt more fatigued than usual. He also felt nauseous. When he continued having these symptoms today, he decided to come to the ED for help.  ED Course: FOBT positive. Hgb was stable. CT w/ possible mild colitis. [facility] consulted. Patient was started on solumedrol. [facility] was called for admission.    Review of Systems:  Denies CP, dyspnea, palpitations, vomiting, fever, sick contacts. Reports RLQ/LLQ ab pain, N, lightheadedness/fogginess. Review of systems is otherwise negative for all not mentioned in HPI.   PMHx Past Medical History:  Diagnosis Date   Anemia 04/26/2013   Crohn disease ([place])    Gastroesophageal reflux disease    Iron deficiency anemia    Left varicocele    Lymphopenia 04/14/2012   HIV negative in 03/2012 per PCP.   Lymphopenia 04/27/2013   Weight loss     PSHx Past Surgical History:  Procedure Laterality Date   IR IMAGING GUIDED PORT INSERTION  03/20/2020   IR REMOVAL TUN ACCESS W/ PORT W/O FL MOD SED  11/03/2020   MASS BIOPSY Right 03/10/2020   Procedure: EXCISIONAL  RIGHT NECK MASS BIOPSY;  Surgeon: Rozetta Nunnery, MD;  Location: [facility];  Service: ENT;  Laterality: Right;   VARICOCELE EXCISION  2010    SocHx  reports that he has never smoked. He has never used smokeless tobacco. He reports current drug use. Drug: Marijuana. He reports that he does not drink alcohol.  Allergies  Allergen Reactions   Amoxicillin Anaphylaxis    FamHx Family History  Problem Relation Age of Onset   Arthritis Mother    Crohn's disease Mother    Hypertension Mother    IgA nephropathy Mother    Arthritis Father    Migraines Father    Eczema Father    Cancer Maternal Aunt 79       colon   Cancer Paternal Grandfather        colon   Migraines Sister     Prior to Admission medications   Medication Sig Start Date End Date Taking? Authorizing Provider  acetaminophen (TYLENOL) 500 MG tablet Take 1,000 mg by mouth every 6 (six) hours as needed for moderate pain or headache.   Yes [provider]  Carboxymethylcellul-Glycerin (LUBRICATING EYE DROPS OP) Place 1 drop into both eyes daily as needed (dry eyes).   Yes [provider]  Cholecalciferol (VITAMIN D) 50 MCG (2000 UT) tablet Take 6,000 Units by mouth daily.   Yes [provider]  EPINEPHrine 0.3 mg/0.3 mL IJ SOAJ injection Inject 0.3 mg into the muscle as needed for anaphylaxis. 01/11/20  Yes [provider]  hyoscyamine (ANASPAZ) 0.125 MG TBDP disintergrating tablet Take 0.125 mg by mouth 4 (four) times daily as needed. cramps 12/12/20  Yes [provider]  Magnesium 250 MG TABS Take 250 mg by mouth daily.   Yes [provider]  Multiple Vitamins-Minerals (ZINC PO) Take 2 tablets by mouth daily. With quercetin   Yes [provider]  OVER THE COUNTER MEDICATION Take 2 capsules by mouth at bedtime as needed (sleep). Stress Relief otc supplement   Yes [provider]  oxymetazoline (AFRIN) 0.05 % nasal spray Place 1 spray  into both nostrils 2 (two) times daily as needed for congestion.   Yes [provider]  polyethylene glycol (MIRALAX / GLYCOLAX) 17 g packet Take 17 g by mouth daily as needed for mild constipation.   Yes [provider]  SUPER B COMPLEX/C PO Take 1 tablet by mouth daily.   Yes [provider]  TURMERIC PO Take 2 capsules by mouth daily.   Yes [provider]  prochlorperazine (COMPAZINE) 10 MG tablet Take 1 tablet (10 mg total) by mouth every 6 (six) hours as needed (Nausea or vomiting). 03/14/20 10/10/20  [name], MD    Physical Exam: Vitals:   04/11/21 1105 04/11/21 1130 04/11/21 1145 04/11/21 1230  BP: 118/76 126/75 124/71 120/68  Pulse: 79 74 75 (!) 59  Resp: 16  17 20   Temp:      TempSrc:      SpO2: 100% 100% 100% 100%    General: 25 y.o. male resting in bed in NAD Eyes: PERRL, normal sclera ENMT: Nares patent w/o discharge, orophaynx clear, dentition normal, ears w/o discharge/lesions/ulcers Neck: Supple, trachea midline Cardiovascular: RRR, +S1, S2, no m/g/r, equal pulses throughout Respiratory: CTABL, no w/r/r, normal WOB GI: BS+, NDNT, no masses noted, no organomegaly noted MSK: No e/c/c Skin: No rashes, bruises, ulcerations noted Neuro: A&O x 3, no focal deficits Psyc: Appropriate interaction and affect, calm/cooperative  Labs on Admission: I have personally reviewed following labs and imaging studies  CBC: Recent Labs  Lab 04/11/21 1001  WBC 11.3*  NEUTROABS 7.6  HGB 14.4  HCT 42.9  MCV 90.1  PLT 174   Basic Metabolic Panel: Recent Labs  Lab 04/11/21 1001  NA 136  K 3.9  CL 103  CO2 26  GLUCOSE 80  BUN 18  CREATININE 0.93  CALCIUM 9.3   GFR: CrCl cannot be calculated (Unknown ideal weight.). Liver Function Tests: Recent Labs  Lab 04/11/21 1001  AST 18  ALT 17  ALKPHOS 58  BILITOT 0.6  PROT 7.4  ALBUMIN 4.2   No results for input(s): LIPASE, AMYLASE in the last 168 hours. No results for input(s):  AMMONIA in the last 168 hours. Coagulation Profile: No results for input(s): INR, PROTIME in the last 168 hours. Cardiac Enzymes: No results for input(s): CKTOTAL, CKMB, CKMBINDEX, TROPONINI in the last 168 hours. BNP (last 3 results) No results for input(s): PROBNP in the last 8760 hours. HbA1C: No results for input(s): HGBA1C in the last 72 hours. CBG: No results for input(s): GLUCAP in the last 168 hours. Lipid Profile: No results for input(s): CHOL, HDL, LDLCALC, TRIG, CHOLHDL, LDLDIRECT in the last 72 hours. Thyroid Function Tests: No results for input(s): TSH, T4TOTAL, FREET4, T3FREE, THYROIDAB in the last 72 hours. Anemia Panel: No results for input(s): VITAMINB12, FOLATE, FERRITIN, TIBC, IRON, RETICCTPCT in the last 72 hours. Urine analysis: No results found for: COLORURINE, APPEARANCEUR, [place], [place], [place], [place], [place],  [place], UROBILINOGEN, NITRITE, LEUKOCYTESUR  Radiological Exams on Admission: CT ABDOMEN PELVIS W CONTRAST  Result Date: 04/11/2021 CLINICAL DATA:  Crohn's disease, rectal bleeding, abdominal pain, melena EXAM: CT ABDOMEN AND PELVIS WITH CONTRAST TECHNIQUE: Multidetector CT imaging of the abdomen and pelvis was performed using the standard protocol following bolus administration of intravenous contrast. CONTRAST:  179m OMNIPAQUE IOHEXOL 350 MG/ML SOLN COMPARISON:  02/13/2020 FINDINGS: Lower chest: No acute abnormality. Hepatobiliary: No focal liver abnormality is seen. No gallstones, gallbladder wall thickening, or biliary dilatation. Pancreas: Unremarkable. No pancreatic ductal dilatation or surrounding inflammatory changes. Spleen: Normal in size without focal abnormality. Adrenals/Urinary Tract: Adrenal glands are unremarkable. Kidneys are normal, without renal calculi, focal lesion, or hydronephrosis. Bladder is unremarkable. Stomach/Bowel: Negative for bowel obstruction, significant dilatation, ileus, or free air. Appendix  unremarkable in the right quadrant. Left descending colon, sigmoid and rectum are collapsed but have diffuse wall prominence, therefore difficult to exclude mild colitis. No free fluid, fluid collection, hemorrhage, hematoma, or abscess Vascular/Lymphatic: No adenopathy. Reproductive: No significant finding by CT. Similar prominence of the seminal vesicles, nonspecific. Other: No abdominal wall hernia or abnormality. No abdominopelvic ascites. Stable postoperative changes in the left lower quadrant were surgical clips overlie the psoas muscle. Musculoskeletal: No acute or significant osseous findings. IMPRESSION: Left descending colon, sigmoid and rectum are collapsed likely accounting for wall prominence but difficult to exclude mild colitis. Negative for obstruction, fluid collection, or abscess. No other significant finding by CT. Electronically Signed   By: MJerilynn Mages  Shick M.D.   On: 04/11/2021 12:37    EKG: None obtained in ED  Assessment/Plan Crohn's flare Rectal bleeding     - admit to inpt, med-surg     - continue solumedrol, fluids, anti-emetics, pain control     - no fevers, no abscesses noted on imaging; hold abx at this time     - checking c diff     - [facility] onboard; appreciate assistance     - Hgb looks good; check q12h H&H     - no obstructive symptoms, can start on FLD and advance as tolerated  Hx of Hodgkin's lymphoma     - completed therapy; in remission; secondary to humira use     - continue follow up with onco  DVT prophylaxis: SCDs  Code Status: FULL  Family Communication: w/ SO at bedside  Consults called: [facility]   Status is: Inpatient  Remains inpatient appropriate because: severity of illness  TJonnie FinnerDO [facility]  If 7PM-7AM, please contact night-coverage [email or web address]  04/11/2021, 2:21 PM

## 2021-04-11 NOTE — ED Triage Notes (Signed)
Pt presents with c/o rectal bleeding for 5 days. Pt reports the blood is dark in nature. Pt has a hx of Crohns disease.

## 2021-04-11 NOTE — ED Provider Notes (Signed)
[facility] Provider Note   CSN: [id number] Arrival date & time: 04/11/21  0913     History Chief Complaint  Patient presents with   Rectal Bleeding    Mark Decker is a 25 y.o. male.  Mark Decker is a 25 y.o. male with hx of Crohn's disease and Hodgkin's lymphoma, who presents to the ED with concern for a Crohn's flare.  Patient reports for the past 5 days he has been having persistent and worsening rectal bleeding.  He reports no anytime he goes to have a bowel movement he just passes bright red blood with very little stool.  He reports that he is often had rectal bleeding with prior Crohn's flares but it has never been this severe.  He reports associated right lower quadrant and suprapubic pain that has been persistent and worsening.  He has had some nausea but no vomiting.  No fevers or chills.  No dysuria or urinary frequency.  Patient reports he is currently managed with mesalamine for his Crohn's flare, he is unable to take biologic therapies for Crohn's after he developed Hodgkin's lymphoma which she has completed treatment for.  He is followed by Dr. Michail Sermon with GI.  The history is provided by the patient and medical records.      Past Medical History:  Diagnosis Date   Anemia 04/26/2013   Crohn disease ([place])    Gastroesophageal reflux disease    Iron deficiency anemia    Left varicocele    Lymphopenia 04/14/2012   HIV negative in 03/2012 per PCP.   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   Acne-like skin bumps 05/05/2020   Hodgkin lymphoma ([place]) 03/14/2020   Preventive measure 03/14/2020   Physical debility 03/07/2020   Crohn disease ([place])    Deficiency anemia    Lymphopenia 04/27/2013   Weight loss     Past  Surgical History:  Procedure Laterality Date   IR IMAGING GUIDED PORT INSERTION  03/20/2020   IR REMOVAL TUN ACCESS W/ PORT W/O FL MOD SED  11/03/2020   MASS BIOPSY Right 03/10/2020   Procedure: EXCISIONAL RIGHT NECK MASS BIOPSY;  Surgeon: Rozetta Nunnery, MD;  Location: [facility];  Service: ENT;  Laterality: Right;   VARICOCELE EXCISION  2010       Family History  Problem Relation Age of Onset   Arthritis Mother    Crohn's disease Mother    Hypertension Mother    IgA nephropathy Mother    Arthritis Father    Migraines Father    Eczema Father    Cancer Maternal Aunt 40       colon   Cancer Paternal Grandfather        colon   Migraines Sister     Social History   Tobacco Use   Smoking status: Never   Smokeless tobacco: Never  Vaping Use   Vaping Use: Former   Substances: Mixture of cannabinoids  Substance Use Topics   Alcohol use: No    Comment: occ.   Drug use: Yes    Types: Marijuana    Home Medications Prior to Admission medications   Medication Sig Start Date End Date Taking? Authorizing Provider  acetaminophen (TYLENOL) 500 MG tablet Take 1,000 mg by mouth every 6 (six) hours  as needed for moderate pain or headache.   Yes [provider]  Carboxymethylcellul-Glycerin (LUBRICATING EYE DROPS OP) Place 1 drop into both eyes daily as needed (dry eyes).   Yes [provider]  Cholecalciferol (VITAMIN D) 50 MCG (2000 UT) tablet Take 6,000 Units by mouth daily.   Yes [provider]  EPINEPHrine 0.3 mg/0.3 mL IJ SOAJ injection Inject 0.3 mg into the muscle as needed for anaphylaxis. 01/11/20  Yes [provider]  hyoscyamine (ANASPAZ) 0.125 MG TBDP disintergrating tablet Take 0.125 mg by mouth 4 (four) times daily as needed. cramps 12/12/20  Yes [provider]  Magnesium 250 MG TABS Take 250 mg by mouth daily.   Yes [provider]  Multiple Vitamins-Minerals (ZINC PO) Take 2 tablets by mouth daily.  With quercetin   Yes [provider]  OVER THE COUNTER MEDICATION Take 2 capsules by mouth at bedtime as needed (sleep). Stress Relief otc supplement   Yes [provider]  oxymetazoline (AFRIN) 0.05 % nasal spray Place 1 spray into both nostrils 2 (two) times daily as needed for congestion.   Yes [provider]  polyethylene glycol (MIRALAX / GLYCOLAX) 17 g packet Take 17 g by mouth daily as needed for mild constipation.   Yes [provider]  SUPER B COMPLEX/C PO Take 1 tablet by mouth daily.   Yes [provider]  TURMERIC PO Take 2 capsules by mouth daily.   Yes [provider]  prochlorperazine (COMPAZINE) 10 MG tablet Take 1 tablet (10 mg total) by mouth every 6 (six) hours as needed (Nausea or vomiting). 03/14/20 10/10/20  [name], MD    Allergies    Amoxicillin  Review of Systems   Review of Systems  Constitutional:  Negative for chills and fever.  HENT: Negative.    Respiratory:  Negative for cough and shortness of breath.   Cardiovascular:  Negative for chest pain.  Gastrointestinal:  Positive for abdominal pain, blood in stool, diarrhea, hematochezia and nausea. Negative for vomiting.  Genitourinary:  Negative for dysuria, frequency and hematuria.  Musculoskeletal:  Negative for arthralgias and myalgias.  Skin:  Negative for color change and rash.  Neurological:  Negative for dizziness, syncope and light-headedness.  All other systems reviewed and are negative.  Physical Exam Updated Vital Signs BP 114/64 (BP Location: Right Arm)   Pulse 65   Temp 98 F (36.7 C) (Oral)   Resp 16   SpO2 98%   Physical Exam Vitals and nursing note reviewed.  Constitutional:      General: He is not in acute distress.    Appearance: Normal appearance. He is well-developed. He is not ill-appearing or diaphoretic.  HENT:     Head: Normocephalic and atraumatic.  Eyes:     General:        Right eye: No discharge.        Left eye:  No discharge.     Pupils: Pupils are equal, round, and reactive to light.  Cardiovascular:     Rate and Rhythm: Normal rate and regular rhythm.     Pulses: Normal pulses.     Heart sounds: Normal heart sounds.  Pulmonary:     Effort: Pulmonary effort is normal. No respiratory distress.     Breath sounds: Normal breath sounds. No wheezing or rales.     Comments: Respirations equal and unlabored, patient able to speak in full sentences, lungs clear to auscultation bilaterally  Abdominal:     General: Bowel  sounds are normal. There is no distension.     Palpations: Abdomen is soft. There is no mass.     Tenderness: There is abdominal tenderness. There is no guarding.     Comments: Abdomen soft, nondistended, RLQ and suprapubic tenderness without guarding  Genitourinary:    Comments: Chaperone present No evidence of external hemorrhoids or fissures, bright red blood present on rectal exam, no stool in rectal vault. Some discomfort with rectal exam, normal rectal tone Musculoskeletal:        General: No deformity.     Cervical back: Neck supple.  Skin:    General: Skin is warm and dry.     Capillary Refill: Capillary refill takes less than 2 seconds.  Neurological:     Mental Status: He is alert and oriented to person, place, and time.     Coordination: Coordination normal.     Comments: Speech is clear, able to follow commands CN III-XII intact Normal strength in upper and lower extremities bilaterally including dorsiflexion and plantar flexion, strong and equal grip strength Sensation normal to light and sharp touch Moves extremities without ataxia, coordination intact  Psychiatric:        Mood and Affect: Mood normal.        Behavior: Behavior normal.    ED Results / Procedures / Treatments   Labs (all labs ordered are listed, but only abnormal results are displayed) Labs Reviewed  CBC WITH DIFFERENTIAL/PLATELET - Abnormal; Notable for the following components:      Result  Value   WBC 11.3 (*)    Monocytes Absolute 1.5 (*)    All other components within normal limits  POC OCCULT BLOOD, ED - Abnormal; Notable for the following components:   Fecal Occult Bld POSITIVE (*)    All other components within normal limits  RESP PANEL BY RT-PCR (FLU A&B, COVID) ARPGX2  C DIFFICILE QUICK SCREEN W PCR REFLEX    COMPREHENSIVE METABOLIC PANEL  HIV ANTIBODY (ROUTINE TESTING W REFLEX)  HEMOGLOBIN AND HEMATOCRIT, BLOOD  TYPE AND SCREEN  ABO/RH    EKG None  Radiology CT ABDOMEN PELVIS W CONTRAST  Result Date: 04/11/2021 CLINICAL DATA:  Crohn's disease, rectal bleeding, abdominal pain, melena EXAM: CT ABDOMEN AND PELVIS WITH CONTRAST TECHNIQUE: Multidetector CT imaging of the abdomen and pelvis was performed using the standard protocol following bolus administration of intravenous contrast. CONTRAST:  127m OMNIPAQUE IOHEXOL 350 MG/ML SOLN COMPARISON:  02/13/2020 FINDINGS: Lower chest: No acute abnormality. Hepatobiliary: No focal liver abnormality is seen. No gallstones, gallbladder wall thickening, or biliary dilatation. Pancreas: Unremarkable. No pancreatic ductal dilatation or surrounding inflammatory changes. Spleen: Normal in size without focal abnormality. Adrenals/Urinary Tract: Adrenal glands are unremarkable. Kidneys are normal, without renal calculi, focal lesion, or hydronephrosis. Bladder is unremarkable. Stomach/Bowel: Negative for bowel obstruction, significant dilatation, ileus, or free air. Appendix unremarkable in the right quadrant. Left descending colon, sigmoid and rectum are collapsed but have diffuse wall prominence, therefore difficult to exclude mild colitis. No free fluid, fluid collection, hemorrhage, hematoma, or abscess Vascular/Lymphatic: No adenopathy. Reproductive: No significant finding by CT. Similar prominence of the seminal vesicles, nonspecific. Other: No abdominal wall hernia or abnormality. No abdominopelvic ascites. Stable postoperative  changes in the left lower quadrant were surgical clips overlie the psoas muscle. Musculoskeletal: No acute or significant osseous findings. IMPRESSION: Left descending colon, sigmoid and rectum are collapsed likely accounting for wall prominence but difficult to exclude mild colitis. Negative for obstruction, fluid collection, or abscess. No other significant finding  by CT. Electronically Signed   By: Jerilynn Mages.  Shick M.D.   On: 04/11/2021 12:37    Procedures Procedures   Medications Ordered in ED Medications  methylPREDNISolone sodium succinate (SOLU-MEDROL) 125 mg/2 mL injection 60 mg (60 mg Intravenous Given 04/11/21 1341)  morphine 4 MG/ML injection 4 mg (has no administration in time range)  pantoprazole (PROTONIX) EC tablet 40 mg (has no administration in time range)  phenol (CHLORASEPTIC) mouth spray 1 spray (has no administration in time range)  prochlorperazine (COMPAZINE) injection 10 mg (has no administration in time range)  acetaminophen (TYLENOL) tablet 1,000 mg (has no administration in time range)  cholecalciferol (VITAMIN D3) tablet 6,000 Units (has no administration in time range)  oxymetazoline (AFRIN) 0.05 % nasal spray 1 spray (has no administration in time range)  polyvinyl alcohol (LIQUIFILM TEARS) 1.4 % ophthalmic solution 1 drop (has no administration in time range)  0.9 %  sodium chloride infusion (has no administration in time range)  influenza vac split quadrivalent PF (FLUARIX) injection 0.5 mL (has no administration in time range)  sodium chloride 0.9 % bolus 1,000 mL (0 mLs Intravenous Stopped 04/11/21 1219)  ondansetron (ZOFRAN) injection 4 mg (4 mg Intravenous Given 04/11/21 1128)  morphine 4 MG/ML injection 4 mg (4 mg Intravenous Given 04/11/21 1128)  iohexol (OMNIPAQUE) 350 MG/ML injection 100 mL (100 mLs Intravenous Contrast Given 04/11/21 1210)    ED Course  I have reviewed the triage vital signs and the nursing notes.  Pertinent labs & imaging results that  were available during my care of the patient were reviewed by me and considered in my medical decision making (see chart for details).    MDM Rules/Calculators/A&P                           Patient with history of Crohn's disease currently managed on mesalamine presents with symptoms concerning for a flare.  He reports for the past week he has been having bloody bowel movements and now reports when he has to use the bathroom he passes only blood.  Reports associated right lower quadrant abdominal pain.  No fevers or chills.  Reports he typically has bleeding with his flares that this is the worst bleeding he has had previously.  Last flare was about 3 weeks ago and was treated with steroid enemas.  Reports he was previously on Humira but after developing Hodgkin's lymphoma he had to be removed from any immunologic therapy, has completed chemotherapy for Hodgkin's lymphoma and is now on surveillance.  Will evaluate with labs and CT abdomen pelvis.  Hemoccult blood present, no visible hemorrhoids.  I have independently ordered, reviewed and interpreted all labs and imaging: CBC: Mild leukocytosis of 11.0, reassuring hemoglobin of 14.4 despite reported bleeding CMP: No electrolyte derangements, normal renal and liver function. Hemoccult: Positive  Type and screen pending  Descending colon, sigmoid and rectum collapsed with some wall thickening which could be due to some mild colitis.  No other acute findings.  Will discuss with GI, patient is followed by Dr. Michail Sermon.  Case discussed with Dr. Alessandra Bevels with [name], he recommends admission with IV Solu-Medrol 60 mg twice daily, and ruling out C. difficile, they will see patient in consult tomorrow.  Case discussed with Dr. Marylyn Ishihara with [facility] who will see and admit the patient.  Final Clinical Impression(s) / ED Diagnoses Final diagnoses:  Exacerbation of Crohn's disease with rectal bleeding ([place])    Rx / DC  Orders ED Discharge  Orders     None        Janet Berlin 04/11/21 2004    Wyvonnia Dusky, MD 04/12/21 [phone]

## 2021-04-11 NOTE — Plan of Care (Signed)

## 2021-04-12 DIAGNOSIS — K50111 Crohn's disease of large intestine with rectal bleeding: Secondary | ICD-10-CM | POA: Diagnosis not present

## 2021-04-12 LAB — CBC
HCT: 44.1 % (ref 39.0–52.0)
Hemoglobin: 14.9 g/dL (ref 13.0–17.0)
MCH: 30 pg (ref 26.0–34.0)
MCHC: 33.8 g/dL (ref 30.0–36.0)
MCV: 88.9 fL (ref 80.0–100.0)
Platelets: 293 10*3/uL (ref 150–400)
RBC: 4.96 MIL/uL (ref 4.22–5.81)
RDW: 12.6 % (ref 11.5–15.5)
WBC: 15 10*3/uL — ABNORMAL HIGH (ref 4.0–10.5)
nRBC: 0 % (ref 0.0–0.2)

## 2021-04-12 LAB — COMPREHENSIVE METABOLIC PANEL
ALT: 17 U/L (ref 0–44)
AST: 16 U/L (ref 15–41)
Albumin: 4.2 g/dL (ref 3.5–5.0)
Alkaline Phosphatase: 60 U/L (ref 38–126)
Anion gap: 9 (ref 5–15)
BUN: 18 mg/dL (ref 6–20)
CO2: 25 mmol/L (ref 22–32)
Calcium: 9.4 mg/dL (ref 8.9–10.3)
Chloride: 101 mmol/L (ref 98–111)
Creatinine, Ser: 0.89 mg/dL (ref 0.61–1.24)
GFR, Estimated: >60 mL/min (ref >60)
Glucose, Bld: 166 mg/dL — ABNORMAL HIGH (ref 70–99)
Potassium: 4.1 mmol/L (ref 3.5–5.1)
Sodium: 135 mmol/L (ref 135–145)
Total Bilirubin: 0.9 mg/dL (ref 0.3–1.2)
Total Protein: 7.6 g/dL (ref 6.5–8.1)

## 2021-04-12 LAB — C-REACTIVE PROTEIN: CRP: 0.7 mg/dL (ref <1.0)

## 2021-04-12 LAB — HIV ANTIBODY (ROUTINE TESTING W REFLEX): HIV Screen 4th Generation wRfx: NONREACTIVE

## 2021-04-12 MED ORDER — MESALAMINE 400 MG PO CPDR: 400.0000 mg | DELAYED_RELEASE_CAPSULE | Freq: Two times a day (BID) | ORAL | Status: DC

## 2021-04-12 MED ORDER — POLYETHYLENE GLYCOL 3350 17 G PO PACK
17.0000 g | PACK | Freq: Every day | ORAL | Status: DC
  Administered 2021-04-12: 17 g via ORAL
  Filled 2021-04-12: qty 1

## 2021-04-12 MED ORDER — MESALAMINE 400 MG PO CPDR
400.0000 mg | DELAYED_RELEASE_CAPSULE | Freq: Two times a day (BID) | ORAL | Status: DC
  Administered 2021-04-12: 400 mg via ORAL
  Filled 2021-04-12 (×2): qty 1

## 2021-04-12 MED ORDER — PREDNISONE 20 MG PO TABS: ORAL_TABLET | ORAL | 0 refills | Status: DC

## 2021-04-12 MED ORDER — METHYLPREDNISOLONE SODIUM SUCC 40 MG IJ SOLR: 40.0000 mg | Freq: Two times a day (BID) | INTRAMUSCULAR | Status: DC

## 2021-04-12 NOTE — Discharge Summary (Signed)
Physician Discharge Summary  Mark Decker JZP:[id number] DOB: [birth year] DOA: 04/11/2021  PCP: [name], MD  Admit date: 04/11/2021 Discharge date: 04/12/2021  Admitted From: Home Disposition: Home  Recommendations for Outpatient Follow-up:  Patient plans to follow-up with his primary gastroenterologist, Dr. Michail Sermon  Discharge Condition: Stable CODE STATUS: Full code Diet recommendation: Soft diet  Brief/Interim Summary: 25 year old male with a history of Crohn's disease, Hodgkin's lymphoma, presents to the emergency room with abdominal pain and rectal bleeding.  Patient has been on mesalamine as an outpatient.  On arrival to the emergency room, CT scan confirmed possible colitis.  Other labs were noted to be stable.  He was started on intravenous steroids and admitted for further management.  He was seen by gastroenterology while in the hospital.  After starting steroids, his symptoms rapidly improved.  He no longer had any abdominal pain.  He was able to tolerate a diet without difficulty.  He did not have any vomiting.  He did not have any further bowel movements/rectal bleeding since admission.  It was felt unlikely the patient had any infectious colitis since he was not having any bowel movements.  The day after admission, patient was feeling well, tolerating solid diet, did not require any pain medicine for abdominal pain and was very eager to discharge home.  Case was reviewed with gastroenterology and it was felt reasonable to start the patient on a prednisone taper and follow-up with his primary gastroenterologist.  Discharge Diagnoses:  Active Problems:   Crohn's colitis [facility])    Discharge Instructions  Discharge Instructions     Diet - low sodium heart healthy   Complete by: As directed    Increase activity slowly   Complete by: As directed       Allergies as of 04/12/2021       Reactions   Amoxicillin Anaphylaxis        Medication List      TAKE these medications    acetaminophen 500 MG tablet Commonly known as: TYLENOL Take 1,000 mg by mouth every 6 (six) hours as needed for moderate pain or headache.   EPINEPHrine 0.3 mg/0.3 mL Soaj injection Commonly known as: EPI-PEN Inject 0.3 mg into the muscle as needed for anaphylaxis.   hyoscyamine 0.125 MG Tbdp disintergrating tablet Commonly known as: ANASPAZ Take 0.125 mg by mouth 4 (four) times daily as needed. cramps   LUBRICATING EYE DROPS OP Place 1 drop into both eyes daily as needed (dry eyes).   Magnesium 250 MG Tabs Take 250 mg by mouth daily.   Mesalamine 400 MG Cpdr DR capsule Commonly known as: ASACOL Take 1 capsule (400 mg total) by mouth 2 (two) times daily.   OVER THE COUNTER MEDICATION Take 2 capsules by mouth at bedtime as needed (sleep). Stress Relief otc supplement   oxymetazoline 0.05 % nasal spray Commonly known as: AFRIN Place 1 spray into both nostrils 2 (two) times daily as needed for congestion.   polyethylene glycol 17 g packet Commonly known as: MIRALAX / GLYCOLAX Take 17 g by mouth daily as needed for mild constipation.   predniSONE 20 MG tablet Commonly known as: DELTASONE Take 25m po daily for 1 week and decrease dose by 132mevery week until complete   SUPER B COMPLEX/C PO Take 1 tablet by mouth daily.   TURMERIC PO Take 2 capsules by mouth daily.   Vitamin D 50 MCG (2000 UT) tablet Take 6,000 Units by mouth daily.   ZINC PO Take 2 tablets by  mouth daily. With quercetin        Follow-up Information     [name], MD Follow up.   Specialty: Gastroenterology Why: call for an appointment in 1 week Contact information: [address] [place] [address] [phone]                Allergies  Allergen Reactions   Amoxicillin Anaphylaxis    Consultations: Gastroenterology   Procedures/Studies: CT ABDOMEN PELVIS W CONTRAST  Result Date: 04/11/2021 CLINICAL DATA:  Crohn's  disease, rectal bleeding, abdominal pain, melena EXAM: CT ABDOMEN AND PELVIS WITH CONTRAST TECHNIQUE: Multidetector CT imaging of the abdomen and pelvis was performed using the standard protocol following bolus administration of intravenous contrast. CONTRAST:  146m OMNIPAQUE IOHEXOL 350 MG/ML SOLN COMPARISON:  02/13/2020 FINDINGS: Lower chest: No acute abnormality. Hepatobiliary: No focal liver abnormality is seen. No gallstones, gallbladder wall thickening, or biliary dilatation. Pancreas: Unremarkable. No pancreatic ductal dilatation or surrounding inflammatory changes. Spleen: Normal in size without focal abnormality. Adrenals/Urinary Tract: Adrenal glands are unremarkable. Kidneys are normal, without renal calculi, focal lesion, or hydronephrosis. Bladder is unremarkable. Stomach/Bowel: Negative for bowel obstruction, significant dilatation, ileus, or free air. Appendix unremarkable in the right quadrant. Left descending colon, sigmoid and rectum are collapsed but have diffuse wall prominence, therefore difficult to exclude mild colitis. No free fluid, fluid collection, hemorrhage, hematoma, or abscess Vascular/Lymphatic: No adenopathy. Reproductive: No significant finding by CT. Similar prominence of the seminal vesicles, nonspecific. Other: No abdominal wall hernia or abnormality. No abdominopelvic ascites. Stable postoperative changes in the left lower quadrant were surgical clips overlie the psoas muscle. Musculoskeletal: No acute or significant osseous findings. IMPRESSION: Left descending colon, sigmoid and rectum are collapsed likely accounting for wall prominence but difficult to exclude mild colitis. Negative for obstruction, fluid collection, or abscess. No other significant finding by CT. Electronically Signed   By: MJerilynn Mages  Shick M.D.   On: 04/11/2021 12:37      Subjective: Patient says he is feeling great.  He no longer has any abdominal pain.  Last bowel movement was prior to admission.   Tolerating diet without any vomiting or worsening pain.  Discharge Exam: Vitals:   04/11/21 1531 04/11/21 1900 04/11/21 2327 04/12/21 0459  BP: 114/64 131/68 (!) 111/100 126/71  Pulse: 65 89 78 92  Resp: 16 16 16 18   Temp: 98 F (36.7 C) 98.6 F (37 C) (!) 97.3 F (36.3 C) 97.6 F (36.4 C)  TempSrc: Oral Oral Oral Oral  SpO2: 98% 100% 100% 98%    General: Pt is alert, awake, not in acute distress Cardiovascular: RRR, S1/S2 +, no rubs, no gallops Respiratory: CTA bilaterally, no wheezing, no rhonchi Abdominal: Soft, NT, ND, bowel sounds + Extremities: no edema, no cyanosis    The results of significant diagnostics from this hospitalization (including imaging, microbiology, ancillary and laboratory) are listed below for reference.     Microbiology: Recent Results (from the past 240 hour(s))  Resp Panel by RT-PCR (Flu A&B, Covid) Nasopharyngeal Swab     Status: None   Collection Time: 04/11/21  1:39 PM   Specimen: Nasopharyngeal Swab; Nasopharyngeal(NP) swabs in vial transport medium  Result Value Ref Range Status   SARS Coronavirus 2 by RT PCR NEGATIVE NEGATIVE Final    Comment: (NOTE) SARS-CoV-2 target nucleic acids are NOT DETECTED.  The SARS-CoV-2 RNA is generally detectable in upper respiratory specimens during the acute phase of infection. The lowest concentration of SARS-CoV-2 viral copies this assay can detect  is 138 copies/mL. A negative result does not preclude SARS-Cov-2 infection and should not be used as the sole basis for treatment or other patient management decisions. A negative result may occur with  improper specimen collection/handling, submission of specimen other than nasopharyngeal swab, presence of viral mutation(s) within the areas targeted by this assay, and inadequate number of viral copies(<138 copies/mL). A negative result must be combined with clinical observations, patient history, and epidemiological information. The expected result is  Negative.  Fact Sheet for Patients:  EntrepreneurPulse.com.au  Fact Sheet for Healthcare Providers:  IncredibleEmployment.be  This test is no t yet approved or cleared by the [place] FDA and  has been authorized for detection and/or diagnosis of SARS-CoV-2 by FDA under an Emergency Use Authorization (EUA). This EUA will remain  in effect (meaning this test can be used) for the duration of the COVID-19 declaration under Section 564(b)(1) of the Act, 21 U.S.C.section 360bbb-3(b)(1), unless the authorization is terminated  or revoked sooner.       Influenza A by PCR NEGATIVE NEGATIVE Final   Influenza B by PCR NEGATIVE NEGATIVE Final    Comment: (NOTE) The Xpert Xpress SARS-CoV-2/FLU/RSV plus assay is intended as an aid in the diagnosis of influenza from Nasopharyngeal swab specimens and should not be used as a sole basis for treatment. Nasal washings and aspirates are unacceptable for Xpert Xpress SARS-CoV-2/FLU/RSV testing.  Fact Sheet for Patients: EntrepreneurPulse.com.au  Fact Sheet for Healthcare Providers: IncredibleEmployment.be  This test is not yet approved or cleared by the [place] FDA and has been authorized for detection and/or diagnosis of SARS-CoV-2 by FDA under an Emergency Use Authorization (EUA). This EUA will remain in effect (meaning this test can be used) for the duration of the COVID-19 declaration under Section 564(b)(1) of the Act, 21 U.S.C. section 360bbb-3(b)(1), unless the authorization is terminated or revoked.  Performed at [facility], [place] [address], [place], [place] [address]      Labs: BNP (last 3 results) No results for input(s): BNP in the last 8760 hours. Basic Metabolic Panel: Recent Labs  Lab 04/11/21 1001 04/12/21 0527  NA 136 135  K 3.9 4.1  CL 103 101  CO2 26 25  GLUCOSE 80 166*  BUN 18 18  CREATININE 0.93 0.89   CALCIUM 9.3 9.4   Liver Function Tests: Recent Labs  Lab 04/11/21 1001 04/12/21 0527  AST 18 16  ALT 17 17  ALKPHOS 58 60  BILITOT 0.6 0.9  PROT 7.4 7.6  ALBUMIN 4.2 4.2   No results for input(s): LIPASE, AMYLASE in the last 168 hours. No results for input(s): AMMONIA in the last 168 hours. CBC: Recent Labs  Lab 04/11/21 1001 04/11/21 2212 04/12/21 0527  WBC 11.3*  --  15.0*  NEUTROABS 7.6  --   --   HGB 14.4 14.6 14.9  HCT 42.9 43.2 44.1  MCV 90.1  --  88.9  PLT 272  --  293   Cardiac Enzymes: No results for input(s): CKTOTAL, CKMB, CKMBINDEX, TROPONINI in the last 168 hours. BNP: Invalid input(s): POCBNP CBG: No results for input(s): GLUCAP in the last 168 hours. D-Dimer No results for input(s): DDIMER in the last 72 hours. Hgb A1c No results for input(s): HGBA1C in the last 72 hours. Lipid Profile No results for input(s): CHOL, HDL, LDLCALC, TRIG, CHOLHDL, LDLDIRECT in the last 72 hours. Thyroid function studies No results for input(s): TSH, T4TOTAL, T3FREE, THYROIDAB in the last 72 hours.  Invalid input(s): FREET3  Anemia work up No results for input(s): VITAMINB12, FOLATE, FERRITIN, TIBC, IRON, RETICCTPCT in the last 72 hours. Urinalysis No results found for: COLORURINE, APPEARANCEUR, LABSPEC, [place], GLUCOSEU, [place], [place], [place], PROTEINUR, UROBILINOGEN, NITRITE, LEUKOCYTESUR Sepsis Labs Invalid input(s): PROCALCITONIN,  WBC,  LACTICIDVEN Microbiology Recent Results (from the past 240 hour(s))  Resp Panel by RT-PCR (Flu A&B, Covid) Nasopharyngeal Swab     Status: None   Collection Time: 04/11/21  1:39 PM   Specimen: Nasopharyngeal Swab; Nasopharyngeal(NP) swabs in vial transport medium  Result Value Ref Range Status   SARS Coronavirus 2 by RT PCR NEGATIVE NEGATIVE Final    Comment: (NOTE) SARS-CoV-2 target nucleic acids are NOT DETECTED.  The SARS-CoV-2 RNA is generally detectable in upper respiratory specimens during the acute phase of  infection. The lowest concentration of SARS-CoV-2 viral copies this assay can detect is 138 copies/mL. A negative result does not preclude SARS-Cov-2 infection and should not be used as the sole basis for treatment or other patient management decisions. A negative result may occur with  improper specimen collection/handling, submission of specimen other than nasopharyngeal swab, presence of viral mutation(s) within the areas targeted by this assay, and inadequate number of viral copies(<138 copies/mL). A negative result must be combined with clinical observations, patient history, and epidemiological information. The expected result is Negative.  Fact Sheet for Patients:  EntrepreneurPulse.com.au  Fact Sheet for Healthcare Providers:  IncredibleEmployment.be  This test is no t yet approved or cleared by the [place] FDA and  has been authorized for detection and/or diagnosis of SARS-CoV-2 by FDA under an Emergency Use Authorization (EUA). This EUA will remain  in effect (meaning this test can be used) for the duration of the COVID-19 declaration under Section 564(b)(1) of the Act, 21 U.S.C.section 360bbb-3(b)(1), unless the authorization is terminated  or revoked sooner.       Influenza A by PCR NEGATIVE NEGATIVE Final   Influenza B by PCR NEGATIVE NEGATIVE Final    Comment: (NOTE) The Xpert Xpress SARS-CoV-2/FLU/RSV plus assay is intended as an aid in the diagnosis of influenza from Nasopharyngeal swab specimens and should not be used as a sole basis for treatment. Nasal washings and aspirates are unacceptable for Xpert Xpress SARS-CoV-2/FLU/RSV testing.  Fact Sheet for Patients: EntrepreneurPulse.com.au  Fact Sheet for Healthcare Providers: IncredibleEmployment.be  This test is not yet approved or cleared by the [place] FDA and has been authorized for detection and/or diagnosis of SARS-CoV-2  by FDA under an Emergency Use Authorization (EUA). This EUA will remain in effect (meaning this test can be used) for the duration of the COVID-19 declaration under Section 564(b)(1) of the Act, 21 U.S.C. section 360bbb-3(b)(1), unless the authorization is terminated or revoked.  Performed at [facility], [place] [address], [place], [place] [address]      Time coordinating discharge: 84mns  SIGNED:   JKathie Dike MD  [facility] 04/12/2021, 7:44 PM   If 7PM-7AM, please contact night-coverage [email or web address]

## 2021-04-12 NOTE — Progress Notes (Addendum)
Pt discharged home with all belongings. Discharge education completed. No questions or concerns at this time. Work excuse sent with pt as well, per pt request.

## 2021-04-12 NOTE — Consult Note (Signed)
Referring Provider:  [facility] Primary Care Physician:  Gastroenterology, [name] Primary Gastroenterologist:  Dr. Michail Sermon  Reason for Consultation: Crohn's flare  HPI: Mark Decker is a 25 y.o. male with past medical history of Crohn's ileitis previously on Humira and Delzicol.  He was diagnosed with Hodgkin's lymphoma in September 2021 requiring chemotherapy.  He is reminded was subsequently discontinued.  He was subsequently kept on low-dose prednisone.  Was seen in the office by Dr. Michail Sermon in August 2022 and was advised to taper down prednisone and was started on Delzicol.  He presented to the hospital yesterday with 5 days of rectal bleeding.  CT scan showed collapsed left descending colon ,sigmoid colon and rectum - difficult to exclude mild colitis but otherwise no evidence of abscess or free fluid.  Blood work essentially showed normal CBC and CMP except for mild elevated white counts..  Occult blood positive in stool.  Patient seen and examined at bedside.  He started having rectal with bright red blood in the stool around 5 days ago.  History of 5-6 bowel movements per day.  He is off of prednisone for last 6 to 8 weeks.  He was also having some right lower quadrant abdominal pain and nausea.  Denied any vomiting.  Denied any fever or chills.  He was started on Solu-Medrol yesterday.  His rectal bleeding is resolved now.  Abdominal pain is also resolved.  According to him, he is back to baseline now.    Colonoscopy in July 2022 showed normal terminal ileum, normal mucosa from cecum to descending colon mild inflammation in the sigmoid colon.  Biopsies from terminal ileum showed normal tissue.  Biopsies from right colon showed no evidence of colitis but biopsies from left colon and sigmoid colon showed mild active colitis.  Past Medical History:  Diagnosis Date   Anemia 04/26/2013   Crohn disease ([place])    Gastroesophageal reflux disease    Iron deficiency anemia    Left  varicocele    Lymphopenia 04/14/2012   HIV negative in 03/2012 per PCP.   Lymphopenia 04/27/2013   Weight loss     Past Surgical History:  Procedure Laterality Date   IR IMAGING GUIDED PORT INSERTION  03/20/2020   IR REMOVAL TUN ACCESS W/ PORT W/O FL MOD SED  11/03/2020   MASS BIOPSY Right 03/10/2020   Procedure: EXCISIONAL RIGHT NECK MASS BIOPSY;  Surgeon: Rozetta Nunnery, MD;  Location: [place];  Service: ENT;  Laterality: Right;   VARICOCELE EXCISION  2010    Prior to Admission medications   Medication Sig Start Date End Date Taking? Authorizing Provider  acetaminophen (TYLENOL) 500 MG tablet Take 1,000 mg by mouth every 6 (six) hours as needed for moderate pain or headache.   Yes [provider]  Carboxymethylcellul-Glycerin (LUBRICATING EYE DROPS OP) Place 1 drop into both eyes daily as needed (dry eyes).   Yes [provider]  Cholecalciferol (VITAMIN D) 50 MCG (2000 UT) tablet Take 6,000 Units by mouth daily.   Yes [provider]  EPINEPHrine 0.3 mg/0.3 mL IJ SOAJ injection Inject 0.3 mg into the muscle as needed for anaphylaxis. 01/11/20  Yes [provider]  hyoscyamine (ANASPAZ) 0.125 MG TBDP disintergrating tablet Take 0.125 mg by mouth 4 (four) times daily as needed. cramps 12/12/20  Yes [provider]  Magnesium 250 MG TABS Take 250 mg by mouth daily.   Yes [provider]  Multiple Vitamins-Minerals (ZINC PO) Take 2 tablets by mouth daily. With  quercetin   Yes [provider]  OVER THE COUNTER MEDICATION Take 2 capsules by mouth at bedtime as needed (sleep). Stress Relief otc supplement   Yes [provider]  oxymetazoline (AFRIN) 0.05 % nasal spray Place 1 spray into both nostrils 2 (two) times daily as needed for congestion.   Yes [provider]  polyethylene glycol (MIRALAX / GLYCOLAX) 17 g packet Take 17 g by mouth daily as needed for mild constipation.   Yes [provider]  SUPER B COMPLEX/C PO Take 1 tablet by mouth daily.   Yes [provider]  TURMERIC PO Take 2 capsules by mouth daily.   Yes [provider]  prochlorperazine (COMPAZINE) 10 MG tablet Take 1 tablet (10 mg total) by mouth every 6 (six) hours as needed (Nausea or vomiting). 03/14/20 10/10/20  [name], MD    Scheduled Meds:  cholecalciferol  6,000 Units Oral Daily   influenza vac split quadrivalent PF  0.5 mL Intramuscular Tomorrow-1000   methylPREDNISolone (SOLU-MEDROL) injection  60 mg Intravenous Q12H   pantoprazole  40 mg Oral Daily   Continuous Infusions: PRN Meds:.acetaminophen, morphine injection, oxymetazoline, phenol, polyvinyl alcohol, prochlorperazine  Allergies as of 04/11/2021 - Review Complete 04/11/2021  Allergen Reaction Noted   Amoxicillin Anaphylaxis 12/14/2016    Family History  Problem Relation Age of Onset   Arthritis Mother    Crohn's disease Mother    Hypertension Mother    IgA nephropathy Mother    Arthritis Father    Migraines Father    Eczema Father    Cancer Maternal Aunt 86       colon   Cancer Paternal Grandfather        colon   Migraines Sister     Social History   Socioeconomic History   Marital status: Single    Spouse name: Not on file   Number of children: 0   Years of education: Not on file   Highest education level: Not on file  Occupational History    Comment: [employer]; Psych  Tobacco Use   Smoking status: Never   Smokeless tobacco: Never  Vaping Use   Vaping Use: Former   Substances: Mixture of cannabinoids  Substance and Sexual Activity   Alcohol use: No    Comment: occ.   Drug use: Yes    Types: Marijuana   Sexual activity: Not on file  Other Topics Concern   Not on file  Social History Narrative   Not on file   Social Determinants of Health   Financial Resource Strain: Not on file  Food Insecurity: Not on file  Transportation Needs: Not on file  Physical Activity: Not on  file  Stress: Not on file  Social Connections: Not on file  Intimate Partner Violence: Not on file    Review of Systems: 12 point review of system is done which is negative except as mentioned in HPI  Physical Exam: Vital signs: Vitals:   04/11/21 2327 04/12/21 0459  BP: (!) 111/100 126/71  Pulse: 78 92  Resp: 16 18  Temp: (!) 97.3 F (36.3 C) 97.6 F (36.4 C)  SpO2: 100% 98%   Last BM Date: 04/11/21 Physical Exam Vitals reviewed.  Constitutional:      General: He is not in acute distress.    Appearance: Normal appearance.  HENT:     Head: Normocephalic and atraumatic.     Nose: Nose normal.  Eyes:     General: No scleral icterus.  Extraocular Movements: Extraocular movements intact.  Cardiovascular:     Rate and Rhythm: Normal rate.     Heart sounds: Normal heart sounds. No murmur heard. Pulmonary:     Effort: Pulmonary effort is normal. No respiratory distress.  Abdominal:     General: Bowel sounds are normal. There is no distension.     Palpations: Abdomen is soft.     Tenderness: There is no abdominal tenderness. There is no guarding.  Musculoskeletal:     Cervical back: Normal range of motion and neck supple.     Right lower leg: No edema.     Left lower leg: No edema.  Skin:    General: Skin is warm.     Coloration: Skin is not jaundiced.  Neurological:     Mental Status: He is alert and oriented to person, place, and time.  Psychiatric:        Mood and Affect: Mood normal.        Thought Content: Thought content normal.        Judgment: Judgment normal.     GI:  Lab Results: Recent Labs    04/11/21 1001 04/11/21 2212 04/12/21 0527  WBC 11.3*  --  15.0*  HGB 14.4 14.6 14.9  HCT 42.9 43.2 44.1  PLT 272  --  293   BMET Recent Labs    04/11/21 1001 04/12/21 0527  NA 136 135  K 3.9 4.1  CL 103 101  CO2 26 25  GLUCOSE 80 166*  BUN 18 18  CREATININE 0.93 0.89  CALCIUM 9.3 9.4   LFT Recent Labs    04/12/21 0527  PROT 7.6  ALBUMIN  4.2  AST 16  ALT 17  ALKPHOS 60  BILITOT 0.9   PT/INR No results for input(s): LABPROT, INR in the last 72 hours.   Studies/Results: CT ABDOMEN PELVIS W CONTRAST  Result Date: 04/11/2021 CLINICAL DATA:  Crohn's disease, rectal bleeding, abdominal pain, melena EXAM: CT ABDOMEN AND PELVIS WITH CONTRAST TECHNIQUE: Multidetector CT imaging of the abdomen and pelvis was performed using the standard protocol following bolus administration of intravenous contrast. CONTRAST:  134m OMNIPAQUE IOHEXOL 350 MG/ML SOLN COMPARISON:  02/13/2020 FINDINGS: Lower chest: No acute abnormality. Hepatobiliary: No focal liver abnormality is seen. No gallstones, gallbladder wall thickening, or biliary dilatation. Pancreas: Unremarkable. No pancreatic ductal dilatation or surrounding inflammatory changes. Spleen: Normal in size without focal abnormality. Adrenals/Urinary Tract: Adrenal glands are unremarkable. Kidneys are normal, without renal calculi, focal lesion, or hydronephrosis. Bladder is unremarkable. Stomach/Bowel: Negative for bowel obstruction, significant dilatation, ileus, or free air. Appendix unremarkable in the right quadrant. Left descending colon, sigmoid and rectum are collapsed but have diffuse wall prominence, therefore difficult to exclude mild colitis. No free fluid, fluid collection, hemorrhage, hematoma, or abscess Vascular/Lymphatic: No adenopathy. Reproductive: No significant finding by CT. Similar prominence of the seminal vesicles, nonspecific. Other: No abdominal wall hernia or abnormality. No abdominopelvic ascites. Stable postoperative changes in the left lower quadrant were surgical clips overlie the psoas muscle. Musculoskeletal: No acute or significant osseous findings. IMPRESSION: Left descending colon, sigmoid and rectum are collapsed likely accounting for wall prominence but difficult to exclude mild colitis. Negative for obstruction, fluid collection, or abscess. No other significant  finding by CT. Electronically Signed   By: MJerilynn Mages  Shick M.D.   On: 04/11/2021 12:37    Impression/Plan: -Rectal bleeding with right lower quadrant abdominal pain in a patient with known history of Crohn's ileocolitis.  He is back to baseline with  use of Solu-Medrol.  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  He was on prednisone for a long time which was tapered 6 to 8 weeks ago and was placed on Delzicol.  Recommendations ----------------------- -Stool for C. difficile pending as patient not having any bowel movements now.  Okay to discontinue order for C. difficile testing if he does not have bowel movement later today.  He usually needs MiraLAX during episodes of flare for constipation. -Change Solu-Medrol to 40 mg IV twice daily -Advance diet to soft -If he is not a candidate for biological agent, consider budesonide as an alternative to long-term prednisone. -GI will follow    LOS: 1 day   Otis Brace  MD, FACP 04/12/2021, 9:21 AM  Contact #  [phone]

## 2021-04-13 ENCOUNTER — Other Ambulatory Visit: Payer: 59

## 2021-04-13 ENCOUNTER — Telehealth: Payer: Self-pay

## 2021-04-13 ENCOUNTER — Ambulatory Visit: Payer: 59 | Admitting: Hematology and Oncology

## 2021-04-13 NOTE — Telephone Encounter (Signed)
-----   Message from [name], MD sent at 04/13/2021  9:31 AM EDT ----- Looks like he went to the hospital and was just DC yesterday How is he doing? Ok to reschedule appt to 1 month

## 2021-04-13 NOTE — Telephone Encounter (Signed)
Called and left the below message. Ask him to call the office back.

## 2021-05-18 ENCOUNTER — Encounter: Payer: Self-pay | Admitting: Hematology and Oncology

## 2021-05-18 ENCOUNTER — Other Ambulatory Visit: Payer: Self-pay

## 2021-05-18 ENCOUNTER — Inpatient Hospital Stay: Payer: 59 | Attending: Hematology and Oncology

## 2021-05-18 ENCOUNTER — Inpatient Hospital Stay (HOSPITAL_BASED_OUTPATIENT_CLINIC_OR_DEPARTMENT_OTHER): Payer: 59 | Admitting: Hematology and Oncology

## 2021-05-18 DIAGNOSIS — K50919 Crohn's disease, unspecified, with unspecified complications: Secondary | ICD-10-CM | POA: Diagnosis not present

## 2021-05-18 DIAGNOSIS — R590 Localized enlarged lymph nodes: Secondary | ICD-10-CM

## 2021-05-18 DIAGNOSIS — G8929 Other chronic pain: Secondary | ICD-10-CM | POA: Insufficient documentation

## 2021-05-18 DIAGNOSIS — C8111 Nodular sclerosis classical Hodgkin lymphoma, lymph nodes of head, face, and neck: Secondary | ICD-10-CM | POA: Diagnosis not present

## 2021-05-18 DIAGNOSIS — C8190 Hodgkin lymphoma, unspecified, unspecified site: Secondary | ICD-10-CM | POA: Insufficient documentation

## 2021-05-18 DIAGNOSIS — R109 Unspecified abdominal pain: Secondary | ICD-10-CM | POA: Insufficient documentation

## 2021-05-18 LAB — COMPREHENSIVE METABOLIC PANEL
ALT: 19 U/L (ref 0–44)
AST: 14 U/L — ABNORMAL LOW (ref 15–41)
Albumin: 4 g/dL (ref 3.5–5.0)
Alkaline Phosphatase: 47 U/L (ref 38–126)
Anion gap: 8 (ref 5–15)
BUN: 23 mg/dL — ABNORMAL HIGH (ref 6–20)
CO2: 24 mmol/L (ref 22–32)
Calcium: 9.6 mg/dL (ref 8.9–10.3)
Chloride: 108 mmol/L (ref 98–111)
Creatinine, Ser: 0.97 mg/dL (ref 0.61–1.24)
GFR, Estimated: >60 mL/min (ref >60)
Glucose, Bld: 88 mg/dL (ref 70–99)
Potassium: 4.1 mmol/L (ref 3.5–5.1)
Sodium: 140 mmol/L (ref 135–145)
Total Bilirubin: 0.5 mg/dL (ref 0.3–1.2)
Total Protein: 7.2 g/dL (ref 6.5–8.1)

## 2021-05-18 LAB — CBC WITH DIFFERENTIAL/PLATELET
Abs Immature Granulocytes: 0.04 10*3/uL (ref 0.00–0.07)
Basophils Absolute: 0 10*3/uL (ref 0.0–0.1)
Basophils Relative: 0 %
Eosinophils Absolute: 0.1 10*3/uL (ref 0.0–0.5)
Eosinophils Relative: 1 %
HCT: 43 % (ref 39.0–52.0)
Hemoglobin: 13.9 g/dL (ref 13.0–17.0)
Immature Granulocytes: 0 %
Lymphocytes Relative: 21 %
Lymphs Abs: 2.2 10*3/uL (ref 0.7–4.0)
MCH: 29.6 pg (ref 26.0–34.0)
MCHC: 32.3 g/dL (ref 30.0–36.0)
MCV: 91.5 fL (ref 80.0–100.0)
Monocytes Absolute: 1.1 10*3/uL — ABNORMAL HIGH (ref 0.1–1.0)
Monocytes Relative: 10 %
Neutro Abs: 7 10*3/uL (ref 1.7–7.7)
Neutrophils Relative %: 68 %
Platelets: 291 10*3/uL (ref 150–400)
RBC: 4.7 MIL/uL (ref 4.22–5.81)
RDW: 13.9 % (ref 11.5–15.5)
WBC: 10.5 10*3/uL (ref 4.0–10.5)
nRBC: 0 % (ref 0.0–0.2)

## 2021-05-18 NOTE — Progress Notes (Signed)
[place] OFFICE PROGRESS NOTE  Patient Care Team: [name], MD as PCP - General (Family Medicine) [name], MD as Attending Physician (Family Medicine) [name], MD (Inactive) (Gastroenterology) [name], MD as Consulting Physician (Gastroenterology)  ASSESSMENT & PLAN:  Hodgkin lymphoma [facility]) The palpable lymphadenopathy in the head and neck region is likely reactive in nature Overall, I do not believe he has cancer recurrence I will see him again in 3 months for further follow-up  Crohn disease [facility]) The patient is having regular abdominal pain and despite recent changes in his diet, he felt that his Crohn's disease is not under control We discussed the risk and benefits of using Biologics/immunotherapy treatment in the setting of recent diagnosis of lymphoma If Humira provide the best control of his disease, I am supportive of him receiving Humira again with close monitoring and follow-up In the meantime, I would defer to his gastroenterologist for further management  No orders of the defined types were placed in this encounter.   All questions were answered. The patient knows to call the clinic with any problems, questions or concerns. The total time spent in the appointment was 20 minutes encounter with patients including review of chart and various tests results, discussions about plan of care and coordination of care plan   [name], MD 05/18/2021 2:15 PM  INTERVAL HISTORY: Please see below for problem oriented charting. he returns for surveillance follow-up for history of Hodgkin lymphoma status posttreatment He is having more frequent flare of Crohn's disease He has stable chronic abdominal pain on a regular basis He has changed his recent diet to include more fiber in an attempt to control his bowel symptoms He has recently recovered from a bout of upper respiratory tract infection and thought he had prominent lymphadenopathy  in the head and neck region  REVIEW OF SYSTEMS:   Constitutional: Denies fevers, chills or abnormal weight loss Eyes: Denies blurriness of vision Ears, nose, mouth, throat, and face: Denies mucositis or sore throat Respiratory: Denies cough, dyspnea or wheezes Cardiovascular: Denies palpitation, chest discomfort or lower extremity swelling Skin: Denies abnormal skin rashes Neurological:Denies numbness, tingling or new weaknesses Behavioral/Psych: Mood is stable, no new changes  All other systems were reviewed with the patient and are negative.  I have reviewed the past medical history, past surgical history, social history and family history with the patient and they are unchanged from previous note.  ALLERGIES:  is allergic to amoxicillin.  MEDICATIONS:  Current Outpatient Medications  Medication Sig Dispense Refill   acetaminophen (TYLENOL) 500 MG tablet Take 1,000 mg by mouth every 6 (six) hours as needed for moderate pain or headache.     Carboxymethylcellul-Glycerin (LUBRICATING EYE DROPS OP) Place 1 drop into both eyes daily as needed (dry eyes).     Cholecalciferol (VITAMIN D) 50 MCG (2000 UT) tablet Take 6,000 Units by mouth daily.     EPINEPHrine 0.3 mg/0.3 mL IJ SOAJ injection Inject 0.3 mg into the muscle as needed for anaphylaxis.     hyoscyamine (ANASPAZ) 0.125 MG TBDP disintergrating tablet Take 0.125 mg by mouth 4 (four) times daily as needed. cramps     Magnesium 250 MG TABS Take 250 mg by mouth daily.     Mesalamine (ASACOL) 400 MG CPDR DR capsule Take 1 capsule (400 mg total) by mouth 2 (two) times daily. 180 capsule    Multiple Vitamins-Minerals (ZINC PO) Take 2 tablets by mouth daily. With quercetin     OVER THE  COUNTER MEDICATION Take 2 capsules by mouth at bedtime as needed (sleep). Stress Relief otc supplement     oxymetazoline (AFRIN) 0.05 % nasal spray Place 1 spray into both nostrils 2 (two) times daily as needed for congestion.     polyethylene glycol (MIRALAX  / GLYCOLAX) 17 g packet Take 17 g by mouth daily as needed for mild constipation.     predniSONE (DELTASONE) 20 MG tablet Take 68m po daily for 1 week and decrease dose by 138mevery week until complete 35 tablet 0   SUPER B COMPLEX/C PO Take 1 tablet by mouth daily.     TURMERIC PO Take 2 capsules by mouth daily.     No current facility-administered medications for this visit.    SUMMARY OF ONCOLOGIC HISTORY: Oncology History  Hodgkin lymphoma ([facility] 03/10/2020 Pathology Results   A. LYMPH NODE, RIGHT SUBMANDIBULAR, BIOPSY:  -  Classic Hodgkin lymphoma  -  See comment   COMMENT:   The submitted lymph nodes are effaced by a vaguely nodular lymphoid proliferation separated by bands of fibrosis with admixed inflammatory cells including neutrophils and eosinophils. The lymphoid proliferation  is composed predominantly of small mature lymphocytes with scattered large atypical cells. The atypical cells have mono and multi-lobated nuclei and prominent nucleoli characteristic of Reed-Sternberg cells.  Lacunar cells are also present.   The neoplastic cells are CD30, CD15 (subset), Pax-5 (dim) and CD20 positive by immunohistochemistry. EBV by in-situ hybridization is positive in the Reed-Sternberg cells. CD3 highlights the background  T-cells.  EBV in-situ hybridization is negative.   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    03/14/2020 Initial Diagnosis   Hodgkin lymphoma ([facility]  03/14/2020 Cancer Staging   Staging form: Hodgkin and Non-Hodgkin Lymphoma, AJCC 8th Edition - Clinical stage from 03/14/2020: Stage I (Hodgkin lymphoma, B - Symptoms) - Signed by GoHeath LarkMD on 03/14/2020    03/18/2020 Echocardiogram   1. Left ventricular ejection fraction, by estimation, is 55 to 60%. The left ventricle has normal function. The left ventricle has no regional wall motion abnormalities. Left  ventricular diastolic parameters were normal. The average left ventricular global longitudinal strain is -15.9 %. The global longitudinal strain is normal.  2. Right ventricular systolic function is normal. The right ventricular size is normal. There is normal pulmonary artery systolic pressure.  3. The mitral valve is normal in structure. No evidence of mitral valve regurgitation. No evidence of mitral stenosis.  4. The aortic valve is normal in structure. Aortic valve regurgitation is not visualized. No aortic stenosis is present.  5. The inferior vena cava is normal in size with greater than 50% respiratory variability, suggesting right atrial pressure of 3 mmHg.   03/20/2020 Procedure   Placement of a subcutaneous port device. Catheter tip at the superior cavoatrial junction.   03/24/2020 - 09/08/2020 Chemotherapy   The patient had ABVD for chemotherapy treatment.     05/16/2020 PET scan   1. Small hypermetabolic RIGHT level II lymph node is indeterminate. Activity is similar to liver activity. No comparison available. 2. Reduction in size and no significant metabolic activity of RIGHT supraclavicular lymph node ( [place] 1). 3. No evidence lymphoma chest, abdomen pelvis.  Normal spleen.  4. Intense metabolic activity localizing to LEFT paraspinal musculature in the cervical neck. Favor benign physiologic muscle musculature in the LEFT neck. Recommend clinical correlation for muscle spasm or trauma. If concern for unlikely  muscular lymphoma, consider contrast CT or MRI of the neck.   06/10/2020 Echocardiogram    1. Left ventricular ejection fraction, by estimation, is 55 to 60%. The left ventricle has normal function. The left ventricle has no regional wall motion abnormalities. Left ventricular diastolic parameters were normal. The average left ventricular global longitudinal strain is -21.6 %. The global longitudinal strain is normal.  2. Right ventricular systolic function is normal. The  right ventricular size is normal. Tricuspid regurgitation signal is inadequate for assessing PA pressure.  3. The mitral valve is normal in structure. Trivial mitral valve regurgitation. No evidence of mitral stenosis.  4. The aortic valve is tricuspid. Aortic valve regurgitation is not visualized. No aortic stenosis is present.     10/10/2020 PET scan   Negative PET-CT for metabolically active lymphoma.  ([place] 1)     11/03/2020 Procedure   Successful removal of implanted Port-A-Cath.     PHYSICAL EXAMINATION: ECOG PERFORMANCE STATUS: 1 - Symptomatic but completely ambulatory  Vitals:   05/18/21 0905  BP: 108/75  Pulse: 88  Resp: 18  Temp: 98.1 F (36.7 C)  SpO2: 100%   Filed Weights   05/18/21 0905  Weight: 228 lb 6.4 oz (103.6 kg)    GENERAL:alert, no distress and comfortable SKIN: skin color, texture, turgor are normal, no rashes or significant lesions EYES: normal, Conjunctiva are pink and non-injected, sclera clear OROPHARYNX:no exudate, no erythema and lips, buccal mucosa, and tongue normal  NECK: supple, thyroid normal size, non-tender, without nodularity LYMPH:  no palpable lymphadenopathy in the cervical, axillary or inguinal.  The slight prominent lymph node in the left submandibular region is likely reactive LUNGS: clear to auscultation and percussion with normal breathing effort HEART: regular rate & rhythm and no murmurs and no lower extremity edema ABDOMEN:abdomen soft, non-tender and normal bowel sounds Musculoskeletal:no cyanosis of digits and no clubbing  NEURO: alert & oriented x 3 with fluent speech, no focal motor/sensory deficits  LABORATORY DATA:  I have reviewed the data as listed    Component Value Date/Time   NA 140 05/18/2021 0855   NA 141 04/27/2013 1217   K 4.1 05/18/2021 0855   K 4.0 04/27/2013 1217   CL 108 05/18/2021 0855   CL 106 04/26/2012 1550   CO2 24 05/18/2021 0855   CO2 28 04/27/2013 1217   GLUCOSE 88 05/18/2021 0855    GLUCOSE 92 04/27/2013 1217   GLUCOSE 108 (H) 04/26/2012 1550   BUN 23 (H) 05/18/2021 0855   BUN 10.2 04/27/2013 1217   CREATININE 0.97 05/18/2021 0855   CREATININE 0.84 10/09/2020 1005   CREATININE 0.8 04/27/2013 1217   CALCIUM 9.6 05/18/2021 0855   CALCIUM 9.7 04/27/2013 1217   PROT 7.2 05/18/2021 0855   PROT 7.5 04/27/2013 1217   ALBUMIN 4.0 05/18/2021 0855   ALBUMIN 3.6 04/27/2013 1217   AST 14 (L) 05/18/2021 0855   AST 22 10/09/2020 1005   AST 14 04/27/2013 1217   ALT 19 05/18/2021 0855   ALT 20 10/09/2020 1005   ALT 23 04/27/2013 1217   ALKPHOS 47 05/18/2021 0855   ALKPHOS 64 04/27/2013 1217   BILITOT 0.5 05/18/2021 0855   BILITOT 0.7 10/09/2020 1005   BILITOT 0.86 04/27/2013 1217   GFRNONAA >60 05/18/2021 0855   GFRNONAA >60 10/09/2020 1005   GFRAA >60 03/24/2020 0853    No results found for: SPEP, UPEP  Lab Results  Component Value Date   WBC 10.5 05/18/2021   NEUTROABS 7.0 05/18/2021  HGB 13.9 05/18/2021   HCT 43.0 05/18/2021   MCV 91.5 05/18/2021   PLT 291 05/18/2021      Chemistry      Component Value Date/Time   NA 140 05/18/2021 0855   NA 141 04/27/2013 1217   K 4.1 05/18/2021 0855   K 4.0 04/27/2013 1217   CL 108 05/18/2021 0855   CL 106 04/26/2012 1550   CO2 24 05/18/2021 0855   CO2 28 04/27/2013 1217   BUN 23 (H) 05/18/2021 0855   BUN 10.2 04/27/2013 1217   CREATININE 0.97 05/18/2021 0855   CREATININE 0.84 10/09/2020 1005   CREATININE 0.8 04/27/2013 1217      Component Value Date/Time   CALCIUM 9.6 05/18/2021 0855   CALCIUM 9.7 04/27/2013 1217   ALKPHOS 47 05/18/2021 0855   ALKPHOS 64 04/27/2013 1217   AST 14 (L) 05/18/2021 0855   AST 22 10/09/2020 1005   AST 14 04/27/2013 1217   ALT 19 05/18/2021 0855   ALT 20 10/09/2020 1005   ALT 23 04/27/2013 1217   BILITOT 0.5 05/18/2021 0855   BILITOT 0.7 10/09/2020 1005   BILITOT 0.86 04/27/2013 1217

## 2021-05-18 NOTE — Assessment & Plan Note (Signed)
The patient is having regular abdominal pain and despite recent changes in his diet, he felt that his Crohn's disease is not under control We discussed the risk and benefits of using Biologics/immunotherapy treatment in the setting of recent diagnosis of lymphoma If Humira provide the best control of his disease, I am supportive of him receiving Humira again with close monitoring and follow-up In the meantime, I would defer to his gastroenterologist for further management

## 2021-05-18 NOTE — Assessment & Plan Note (Signed)
The palpable lymphadenopathy in the head and neck region is likely reactive in nature Overall, I do not believe he has cancer recurrence I will see him again in 3 months for further follow-up

## 2021-05-31 ENCOUNTER — Emergency Department (HOSPITAL_COMMUNITY): Payer: 59

## 2021-05-31 ENCOUNTER — Encounter (HOSPITAL_COMMUNITY): Payer: Self-pay | Admitting: Emergency Medicine

## 2021-05-31 ENCOUNTER — Other Ambulatory Visit: Payer: Self-pay

## 2021-05-31 ENCOUNTER — Emergency Department (HOSPITAL_COMMUNITY)
Admission: EM | Admit: 2021-05-31 | Discharge: 2021-05-31 | Disposition: A | Discharge status: Home / Self Care | Payer: 59 | Attending: Emergency Medicine | Admitting: Emergency Medicine

## 2021-05-31 DIAGNOSIS — R000 Tachycardia, unspecified: Secondary | ICD-10-CM | POA: Diagnosis not present

## 2021-05-31 DIAGNOSIS — J101 Influenza due to other identified influenza virus with other respiratory manifestations: Secondary | ICD-10-CM | POA: Diagnosis not present

## 2021-05-31 DIAGNOSIS — Z79899 Other long term (current) drug therapy: Secondary | ICD-10-CM | POA: Insufficient documentation

## 2021-05-31 DIAGNOSIS — R109 Unspecified abdominal pain: Secondary | ICD-10-CM | POA: Diagnosis not present

## 2021-05-31 DIAGNOSIS — Z20822 Contact with and (suspected) exposure to covid-19: Secondary | ICD-10-CM | POA: Diagnosis not present

## 2021-05-31 DIAGNOSIS — R509 Fever, unspecified: Secondary | ICD-10-CM | POA: Diagnosis present

## 2021-05-31 LAB — COMPREHENSIVE METABOLIC PANEL
ALT: 23 U/L (ref 0–44)
AST: 24 U/L (ref 15–41)
Albumin: 4.1 g/dL (ref 3.5–5.0)
Alkaline Phosphatase: 41 U/L (ref 38–126)
Anion gap: 9 (ref 5–15)
BUN: 14 mg/dL (ref 6–20)
CO2: 22 mmol/L (ref 22–32)
Calcium: 8.8 mg/dL — ABNORMAL LOW (ref 8.9–10.3)
Chloride: 101 mmol/L (ref 98–111)
Creatinine, Ser: 1.14 mg/dL (ref 0.61–1.24)
GFR, Estimated: >60 mL/min (ref >60)
Glucose, Bld: 135 mg/dL — ABNORMAL HIGH (ref 70–99)
Potassium: 3.5 mmol/L (ref 3.5–5.1)
Sodium: 132 mmol/L — ABNORMAL LOW (ref 135–145)
Total Bilirubin: 0.5 mg/dL (ref 0.3–1.2)
Total Protein: 7.1 g/dL (ref 6.5–8.1)

## 2021-05-31 LAB — CBC WITH DIFFERENTIAL/PLATELET
Abs Immature Granulocytes: 0.04 10*3/uL (ref 0.00–0.07)
Basophils Absolute: 0 10*3/uL (ref 0.0–0.1)
Basophils Relative: 0 %
Eosinophils Absolute: 0 10*3/uL (ref 0.0–0.5)
Eosinophils Relative: 0 %
HCT: 43.8 % (ref 39.0–52.0)
Hemoglobin: 14.6 g/dL (ref 13.0–17.0)
Immature Granulocytes: 0 %
Lymphocytes Relative: 5 %
Lymphs Abs: 0.5 10*3/uL — ABNORMAL LOW (ref 0.7–4.0)
MCH: 30.2 pg (ref 26.0–34.0)
MCHC: 33.3 g/dL (ref 30.0–36.0)
MCV: 90.5 fL (ref 80.0–100.0)
Monocytes Absolute: 2.2 10*3/uL — ABNORMAL HIGH (ref 0.1–1.0)
Monocytes Relative: 22 %
Neutro Abs: 7.2 10*3/uL (ref 1.7–7.7)
Neutrophils Relative %: 73 %
Platelets: 197 10*3/uL (ref 150–400)
RBC: 4.84 MIL/uL (ref 4.22–5.81)
RDW: 14 % (ref 11.5–15.5)
WBC: 10.1 10*3/uL (ref 4.0–10.5)
nRBC: 0 % (ref 0.0–0.2)

## 2021-05-31 LAB — RESP PANEL BY RT-PCR (FLU A&B, COVID) ARPGX2
Influenza A by PCR: POSITIVE — AB
Influenza B by PCR: NEGATIVE
SARS Coronavirus 2 by RT PCR: NEGATIVE

## 2021-05-31 LAB — LIPASE, BLOOD: Lipase: 36 U/L (ref 11–51)

## 2021-05-31 MED ORDER — OSELTAMIVIR PHOSPHATE 75 MG PO CAPS: 75.0000 mg | ORAL_CAPSULE | Freq: Two times a day (BID) | ORAL | 0 refills | Status: DC

## 2021-05-31 MED ORDER — SODIUM CHLORIDE 0.9 % IV BOLUS
1000.0000 mL | Freq: Once | INTRAVENOUS | Status: AC
  Administered 2021-05-31: 1000 mL via INTRAVENOUS

## 2021-05-31 MED ORDER — ONDANSETRON HCL 4 MG PO TABS: 4.0000 mg | ORAL_TABLET | Freq: Four times a day (QID) | ORAL | 0 refills | Status: DC

## 2021-05-31 MED ORDER — ACETAMINOPHEN 325 MG PO TABS
650.0000 mg | ORAL_TABLET | Freq: Once | ORAL | Status: AC
  Administered 2021-05-31: 650 mg via ORAL
  Filled 2021-05-31: qty 2

## 2021-05-31 MED ORDER — IOHEXOL 350 MG/ML SOLN
80.0000 mL | Freq: Once | INTRAVENOUS | Status: AC | PRN
  Administered 2021-05-31: 80 mL via INTRAVENOUS

## 2021-05-31 MED ORDER — BENZONATATE 100 MG PO CAPS: 100.0000 mg | ORAL_CAPSULE | Freq: Three times a day (TID) | ORAL | 0 refills | Status: DC

## 2021-05-31 MED ORDER — MORPHINE SULFATE (PF) 4 MG/ML IV SOLN
4.0000 mg | Freq: Once | INTRAVENOUS | Status: AC
  Administered 2021-05-31: 4 mg via INTRAVENOUS
  Filled 2021-05-31: qty 1

## 2021-05-31 MED ORDER — OSELTAMIVIR PHOSPHATE 75 MG PO CAPS
75.0000 mg | ORAL_CAPSULE | Freq: Once | ORAL | Status: AC
  Administered 2021-05-31: 75 mg via ORAL
  Filled 2021-05-31: qty 1

## 2021-05-31 MED ORDER — ONDANSETRON HCL 4 MG/2ML IJ SOLN
4.0000 mg | Freq: Once | INTRAMUSCULAR | Status: AC
  Administered 2021-05-31: 4 mg via INTRAVENOUS
  Filled 2021-05-31: qty 2

## 2021-05-31 NOTE — ED Provider Notes (Signed)
[facility] DEPT Provider Note   CSN: [id number] Arrival date & time: 05/31/21  1430     History Chief Complaint  Patient presents with   Fever   Abdominal Pain    Mark Decker is a 25 y.o. male.  The history is provided by the patient and medical records. No language interpreter was used.  Fever Abdominal Pain Associated symptoms: fever    24 year old male significant history of Crohn's disease currently on Biologics, GERD, anemia, who presents with cold symptoms.  For the past 2 days patient has had fever, chills, headache, congestion, cough, nausea, vomiting, blood in stools as well as having abdominal pain.  Abdominal cramping is described as a sharp cramping sensation to his mid abdomen worse with cough and moderate in intensity.  His girlfriend has similar symptoms that lasted for a few days but has since resolved.  His symptoms however has persisted.  He denies any significant shortness of breath, dysuria.  No black tarry stool.  He did try over-the-counter medication at home without relief.  Past Medical History:  Diagnosis Date   Anemia 04/26/2013   Crohn disease ([place])    Gastroesophageal reflux disease    Iron deficiency anemia    Left varicocele    Lymphopenia 04/14/2012   HIV negative in 03/2012 per PCP.   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   Acne-like skin bumps 05/05/2020   Hodgkin lymphoma ([place]) 03/14/2020   Preventive measure 03/14/2020   Physical debility 03/07/2020   Crohn disease ([place])    Deficiency anemia    Lymphopenia 04/27/2013   Weight loss     Past Surgical History:  Procedure Laterality Date   IR IMAGING GUIDED PORT INSERTION  03/20/2020   IR REMOVAL TUN ACCESS W/ PORT W/O FL MOD SED   11/03/2020   MASS BIOPSY Right 03/10/2020   Procedure: EXCISIONAL RIGHT NECK MASS BIOPSY;  Surgeon: Rozetta Nunnery, MD;  Location: [facility];  Service: ENT;  Laterality: Right;   VARICOCELE EXCISION  2010       Family History  Problem Relation Age of Onset   Arthritis Mother    Crohn's disease Mother    Hypertension Mother    IgA nephropathy Mother    Arthritis Father    Migraines Father    Eczema Father    Cancer Maternal Aunt 82       colon   Cancer Paternal Grandfather        colon   Migraines Sister     Social History   Tobacco Use   Smoking status: Never   Smokeless tobacco: Never  Vaping Use   Vaping Use: Former   Substances: Mixture of cannabinoids  Substance Use Topics   Alcohol use: No    Comment: occ.   Drug use: Yes    Types: Marijuana    Home Medications Prior to Admission medications   Medication Sig Start Date End Date Taking? Authorizing Provider  acetaminophen (TYLENOL) 500 MG tablet Take 1,000 mg by mouth every 6 (six) hours as needed for moderate pain or headache.    [provider]  Carboxymethylcellul-Glycerin (LUBRICATING EYE DROPS OP) Place 1 drop into both eyes daily as needed (dry eyes).    [provider]  Cholecalciferol (VITAMIN D) 50 MCG (  2000 UT) tablet Take 6,000 Units by mouth daily.    [provider]  EPINEPHrine 0.3 mg/0.3 mL IJ SOAJ injection Inject 0.3 mg into the muscle as needed for anaphylaxis. 01/11/20   [provider]  hyoscyamine (ANASPAZ) 0.125 MG TBDP disintergrating tablet Take 0.125 mg by mouth 4 (four) times daily as needed. cramps 12/12/20   [provider]  Magnesium 250 MG TABS Take 250 mg by mouth daily.    [provider]  Mesalamine (ASACOL) 400 MG CPDR DR capsule Take 1 capsule (400 mg total) by mouth 2 (two) times daily. 04/12/21   Kathie Dike, MD  Multiple Vitamins-Minerals (ZINC PO) Take 2 tablets by mouth daily. With quercetin     [provider]  OVER THE COUNTER MEDICATION Take 2 capsules by mouth at bedtime as needed (sleep). Stress Relief otc supplement    [provider]  oxymetazoline (AFRIN) 0.05 % nasal spray Place 1 spray into both nostrils 2 (two) times daily as needed for congestion.    [provider]  polyethylene glycol (MIRALAX / GLYCOLAX) 17 g packet Take 17 g by mouth daily as needed for mild constipation.    [provider]  predniSONE (DELTASONE) 20 MG tablet Take 4m po daily for 1 week and decrease dose by 158mevery week until complete 04/12/21   MeKathie DikeMD  SUPER B COMPLEX/C PO Take 1 tablet by mouth daily.    [provider]  TURMERIC PO Take 2 capsules by mouth daily.    [provider]  prochlorperazine (COMPAZINE) 10 MG tablet Take 1 tablet (10 mg total) by mouth every 6 (six) hours as needed (Nausea or vomiting). 03/14/20 10/10/20  GoHeath LarkMD    Allergies    Amoxicillin  Review of Systems   Review of Systems  Constitutional:  Positive for fever.  Gastrointestinal:  Positive for abdominal pain.  All other systems reviewed and are negative.  Physical Exam Updated Vital Signs BP 114/75   Pulse (!) 118   Temp (!) 102.9 F (39.4 C) (Oral)   Resp 18   SpO2 97%   Physical Exam Vitals and nursing note reviewed.  Constitutional:      General: He is not in acute distress.    Appearance: He is well-developed.  HENT:     Head: Atraumatic.  Eyes:     Conjunctiva/sclera: Conjunctivae normal.  Cardiovascular:     Rate and Rhythm: Tachycardia present.     Pulses: Normal pulses.     Heart sounds: Normal heart sounds.  Pulmonary:     Effort: Pulmonary effort is normal.     Breath sounds: Normal breath sounds. No wheezing, rhonchi or rales.  Abdominal:     Palpations: Abdomen is soft.     Tenderness: There is abdominal tenderness (Abdomen is diffusely tender without guarding or rebound tenderness.).  Musculoskeletal:      Cervical back: Neck supple.  Skin:    General: Skin is warm.     Findings: No rash.  Neurological:     Mental Status: He is alert and oriented to person, place, and time.  Psychiatric:        Mood and Affect: Mood normal.    ED Results / Procedures / Treatments   Labs (all labs ordered are listed, but only abnormal results are displayed) Labs Reviewed  RESP PANEL BY RT-PCR (FLU A&B, COVID) ARPGX2 - Abnormal; Notable for the following components:      Result Value   Influenza  A by PCR POSITIVE (*)    All other components within normal limits  CBC WITH DIFFERENTIAL/PLATELET - Abnormal; Notable for the following components:   Lymphs Abs 0.5 (*)    Monocytes Absolute 2.2 (*)    All other components within normal limits  COMPREHENSIVE METABOLIC PANEL - Abnormal; Notable for the following components:   Sodium 132 (*)    Glucose, Bld 135 (*)    Calcium 8.8 (*)    All other components within normal limits  LIPASE, BLOOD  POC OCCULT BLOOD, ED    EKG None  Radiology CT ABDOMEN PELVIS W CONTRAST  Result Date: 05/31/2021 CLINICAL DATA:  Abdominal pain.  Acute nonlocalized EXAM: CT ABDOMEN AND PELVIS WITH CONTRAST TECHNIQUE: Multidetector CT imaging of the abdomen and pelvis was performed using the standard protocol following bolus administration of intravenous contrast. CONTRAST:  72m OMNIPAQUE IOHEXOL 350 MG/ML SOLN COMPARISON:  CT abdomen pelvis 04/11/2021. FINDINGS: Lower chest: Bilateral lower lobe subsegmental atelectasis. No acute abnormality. Hepatobiliary: No focal liver abnormality. No gallstones, gallbladder wall thickening, or pericholecystic fluid. No biliary dilatation. Pancreas: No focal lesion. Normal pancreatic contour. No surrounding inflammatory changes. No main pancreatic ductal dilatation. Spleen: Normal in size without focal abnormality. Adrenals/Urinary Tract: No adrenal nodule bilaterally. Bilateral kidneys enhance symmetrically. No hydronephrosis. No hydroureter. The  urinary bladder is unremarkable. Stomach/Bowel: Stomach is within normal limits. No evidence of bowel wall thickening or dilatation. Appendix appears normal. Vascular/Lymphatic: No abdominal aorta or iliac aneurysm. No abdominal, pelvic, or inguinal lymphadenopathy. Reproductive: Prostate is unremarkable. Other: Left lower abdominal surgical clips. No intraperitoneal free fluid. No intraperitoneal free gas. No organized fluid collection. Musculoskeletal: No abdominal wall hernia or abnormality. No suspicious lytic or blastic osseous lesions. No acute displaced fracture. IMPRESSION: No acute intra-abdominal or intrapelvic abnormality. Electronically Signed   By: MIven FinnM.D.   On: 05/31/2021 21:21    Procedures Procedures   Medications Ordered in ED Medications  sodium chloride 0.9 % bolus 1,000 mL (has no administration in time range)  morphine 4 MG/ML injection 4 mg (has no administration in time range)  ondansetron (ZOFRAN) injection 4 mg (has no administration in time range)  acetaminophen (TYLENOL) tablet 650 mg (650 mg Oral Given 05/31/21 2006)    ED Course  I have reviewed the triage vital signs and the nursing notes.  Pertinent labs & imaging results that were available during my care of the patient were reviewed by me and considered in my medical decision making (see chart for details).    MDM Rules/Calculators/A&P                           BP 131/77 (BP Location: Left Arm)   Pulse 89   Temp 99.5 F (37.5 C)   Resp 16   SpO2 99%   Final Clinical Impression(s) / ED Diagnoses Final diagnoses:  Influenza A    Rx / DC Orders ED Discharge Orders          Ordered    benzonatate (TESSALON) 100 MG capsule  Every 8 hours        05/31/21 2315    oseltamivir (TAMIFLU) 75 MG capsule  Every 12 hours        05/31/21 2315    ondansetron (ZOFRAN) 4 MG tablet  Every 6 hours        05/31/21 2315          8:17 PM Patient here with cold symptoms for 2  days.  Furthermore  he also has history of Crohn's disease and complaining of abdominal pain and rectal bleeding.  He does have a temperature of 102.9, is tachycardic but not hypotensive.  Work-up initiated.  10:43 PM Viral respiratory panel came back showing that patient is positive for influenza A.  Fortunately his labs are reassuring and his abdominal pelvic CT scan did not show any acute finding.  Since patient is within the window of treatment with Tamiflu, will prescribe Tamiflu.  Plan to give patient initial dose here in the ER.  Patient's vital signs did improve with Tylenol and IVF.    Mark Decker was evaluated in Emergency Department on 05/31/2021 for the symptoms described in the history of present illness. He was evaluated in the context of the global COVID-19 pandemic, which necessitated consideration that the patient might be at risk for infection with the SARS-CoV-2 virus that causes COVID-19. Institutional protocols and algorithms that pertain to the evaluation of patients at risk for COVID-19 are in a state of rapid change based on information released by regulatory bodies including the CDC and federal and state organizations. These policies and algorithms were followed during the patient's care in the ED.    Domenic Moras, PA-C 05/31/21 2317    Valarie Merino, MD 06/03/21 [phone]

## 2021-05-31 NOTE — ED Provider Notes (Signed)
Emergency Medicine Provider Triage Evaluation Note  Mark Decker , a 25 y.o. male  was evaluated in triage.  Pt complains of feeling unwell.  Having headache, congestion, rhinorrhea, cough, abdominal pain.  Abdominal pain started prior to fever.  Feels like he is having a Crohn's flare.  Had some blood in his stool.  Not on any Biologics.  Feels like he is constipated which is consistent with his prior Crohn's flares.  Review of Systems  Positive: Fever, cough, headache, abdominal pain Negative:   Physical Exam  BP 119/79 (BP Location: Left Arm)   Pulse 99   Temp (!) 102.7 F (39.3 C) (Oral)   Resp 18   SpO2 93%  Gen:   Awake, no distress   Resp:  Normal effort  MSK:   Moves extremities without difficulty  Other:    Medical Decision Making  Medically screening exam initiated at 2:47 PM.  Appropriate orders placed.  Jonmarc Bodkin was informed that the remainder of the evaluation will be completed by another provider, this initial triage assessment does not replace that evaluation, and the importance of remaining in the ED until their evaluation is complete.  Fever, abd pain, ha, cough   Jobany Montellano A, PA-C 05/31/21 1448    Varney Biles, MD 05/31/21 1540

## 2021-05-31 NOTE — ED Triage Notes (Signed)
PT c/o fever since yesterday with congestion and abdominal pain with N/V. Hx Crohn's disease.

## 2021-05-31 NOTE — ED Notes (Signed)
Pt NAD, a/ox4. Pt verbalizes understanding of all DC and f/u instructions. All questions answered. Pt walks with steady gait to lobby at DC.  ? ?

## 2021-05-31 NOTE — ED Notes (Signed)
Pt in mild distress with sweat visible on forehead. A/ox4, c/o fever and then ABD pain/Chron's disease flare up. 9/10 diffuse "bloating" ABD pain. +n/v and blood stool which pt states is normal for flare-up. + cough with green sputum. Pt denies GU symptoms.

## 2021-05-31 NOTE — Discharge Instructions (Signed)
You have been diagnosed with influenza A.  Please take Tamiflu and medication prescribed.  You may take over-the-counter Tylenol as needed for fever and body aches.  Please follow-up with your gastroenterologist, Dr. Michail Sermon, for further assessment and management of your Crohn's disease.  Return to the ER if you have any concern.

## 2021-05-31 NOTE — ED Notes (Signed)
Called lab regarding respiratory swab showing collected but no result. Lab advises they did not receive the swab and we will need to recollect.

## 2021-08-07 ENCOUNTER — Telehealth: Payer: Self-pay

## 2021-08-07 NOTE — Telephone Encounter (Signed)
I can see him on Monday Please schedule labs and see me at 145

## 2021-08-07 NOTE — Telephone Encounter (Signed)
He called and left a message. The lump in his groin area seems to be getting larger and starting to feel hard. C/o shooting pains from the groin area at times. He is watching the lump under his jaw. He is complaining of aches and pains to the left side of his neck.

## 2021-08-07 NOTE — Telephone Encounter (Signed)
Called and scheduled appts on 2/13, he is aware of appts.

## 2021-08-10 ENCOUNTER — Inpatient Hospital Stay: Payer: 59 | Attending: Hematology and Oncology | Admitting: Hematology and Oncology

## 2021-08-10 ENCOUNTER — Other Ambulatory Visit: Payer: Self-pay

## 2021-08-10 ENCOUNTER — Inpatient Hospital Stay: Payer: 59

## 2021-08-10 VITALS — BP 120/68 | HR 93 | Temp 98.3°F | Resp 18 | Ht 77.0 in | Wt 222.4 lb

## 2021-08-10 DIAGNOSIS — C8111 Nodular sclerosis classical Hodgkin lymphoma, lymph nodes of head, face, and neck: Secondary | ICD-10-CM | POA: Diagnosis not present

## 2021-08-10 DIAGNOSIS — K50111 Crohn's disease of large intestine with rectal bleeding: Secondary | ICD-10-CM | POA: Diagnosis not present

## 2021-08-10 DIAGNOSIS — R197 Diarrhea, unspecified: Secondary | ICD-10-CM | POA: Insufficient documentation

## 2021-08-10 DIAGNOSIS — R590 Localized enlarged lymph nodes: Secondary | ICD-10-CM | POA: Diagnosis not present

## 2021-08-10 DIAGNOSIS — T451X5A Adverse effect of antineoplastic and immunosuppressive drugs, initial encounter: Secondary | ICD-10-CM

## 2021-08-10 DIAGNOSIS — C8190 Hodgkin lymphoma, unspecified, unspecified site: Secondary | ICD-10-CM | POA: Insufficient documentation

## 2021-08-10 DIAGNOSIS — G620 Drug-induced polyneuropathy: Secondary | ICD-10-CM

## 2021-08-10 LAB — CBC WITH DIFFERENTIAL/PLATELET
Abs Immature Granulocytes: 0.02 10*3/uL (ref 0.00–0.07)
Basophils Absolute: 0.1 10*3/uL (ref 0.0–0.1)
Basophils Relative: 1 %
Eosinophils Absolute: 0.4 10*3/uL (ref 0.0–0.5)
Eosinophils Relative: 4 %
HCT: 39.6 % (ref 39.0–52.0)
Hemoglobin: 13.6 g/dL (ref 13.0–17.0)
Immature Granulocytes: 0 %
Lymphocytes Relative: 19 %
Lymphs Abs: 1.8 10*3/uL (ref 0.7–4.0)
MCH: 29.6 pg (ref 26.0–34.0)
MCHC: 34.3 g/dL (ref 30.0–36.0)
MCV: 86.1 fL (ref 80.0–100.0)
Monocytes Absolute: 1 10*3/uL (ref 0.1–1.0)
Monocytes Relative: 11 %
Neutro Abs: 6.4 10*3/uL (ref 1.7–7.7)
Neutrophils Relative %: 65 %
Platelets: 306 10*3/uL (ref 150–400)
RBC: 4.6 MIL/uL (ref 4.22–5.81)
RDW: 13.3 % (ref 11.5–15.5)
WBC: 9.6 10*3/uL (ref 4.0–10.5)
nRBC: 0 % (ref 0.0–0.2)

## 2021-08-10 LAB — COMPREHENSIVE METABOLIC PANEL
ALT: 16 U/L (ref 0–44)
AST: 15 U/L (ref 15–41)
Albumin: 4.4 g/dL (ref 3.5–5.0)
Alkaline Phosphatase: 53 U/L (ref 38–126)
Anion gap: 7 (ref 5–15)
BUN: 21 mg/dL — ABNORMAL HIGH (ref 6–20)
CO2: 25 mmol/L (ref 22–32)
Calcium: 9.6 mg/dL (ref 8.9–10.3)
Chloride: 105 mmol/L (ref 98–111)
Creatinine, Ser: 0.81 mg/dL (ref 0.61–1.24)
GFR, Estimated: >60 mL/min (ref >60)
Glucose, Bld: 96 mg/dL (ref 70–99)
Potassium: 4 mmol/L (ref 3.5–5.1)
Sodium: 137 mmol/L (ref 135–145)
Total Bilirubin: 0.4 mg/dL (ref 0.3–1.2)
Total Protein: 7.4 g/dL (ref 6.5–8.1)

## 2021-08-10 LAB — SEDIMENTATION RATE: Sed Rate: 19 mm/hr — ABNORMAL HIGH (ref 0–16)

## 2021-08-11 ENCOUNTER — Telehealth: Payer: Self-pay

## 2021-08-11 ENCOUNTER — Encounter: Payer: Self-pay | Admitting: Hematology and Oncology

## 2021-08-11 NOTE — Assessment & Plan Note (Signed)
He has persistent palpable lymphadenopathy in the right cervical region and now new palpable lymphadenopathy in the left groin region The size of the lymph nodes are small, measures slightly under 1 cm Reactive lymphadenopathy cannot be excluded However, given his immunocompromise state from Crohn's disease, relapse of disease is not impossible I recommend repeat PET CT scan for staging and he is in agreement

## 2021-08-11 NOTE — Telephone Encounter (Signed)
Called and scheduled appt with [name] 2/27 at 1120. He is aware of appt date/time.

## 2021-08-11 NOTE — Assessment & Plan Note (Signed)
He has significant active Crohn's disease with occasional rectal bleeding and rectal pain He will continue mesalamine as prescribed by his gastroenterologist

## 2021-08-11 NOTE — Assessment & Plan Note (Signed)
He has slight worsening peripheral neuropathy in the wintertime likely residual side effects from prior treatment Observe closely for now

## 2021-08-11 NOTE — Progress Notes (Signed)
[facility] OFFICE PROGRESS NOTE  Patient Care Team: [name], MD as PCP - General (Family Medicine) [name], MD as Attending Physician (Family Medicine) [name], MD (Inactive) (Gastroenterology) [name], MD as Consulting Physician (Gastroenterology)  ASSESSMENT & PLAN:  Hodgkin lymphoma [facility]) Mark Decker has persistent palpable lymphadenopathy in the right cervical region and now new palpable lymphadenopathy in the left groin region The size of the lymph nodes are small, measures slightly under 1 cm Reactive lymphadenopathy cannot be excluded However, given his immunocompromise state from Crohn's disease, relapse of disease is not impossible I recommend repeat PET CT scan for staging and Mark Decker is in agreement  Crohn's colitis [facility]) Mark Decker has significant active Crohn's disease with occasional rectal bleeding and rectal pain Mark Decker will continue mesalamine as prescribed by his gastroenterologist  Peripheral neuropathy due to chemotherapy [facility]) Mark Decker has slight worsening peripheral neuropathy in the wintertime likely residual side effects from prior treatment Observe closely for now  Orders Placed This Encounter  Procedures   NM PET Image Restage (PS) Skull Base to Thigh (F-18 FDG)    Standing Status:   Future    Standing Expiration Date:   08/10/2022    Order Specific Question:   If indicated for the ordered procedure, I authorize the administration of a radiopharmaceutical per Radiology protocol    Answer:   Yes    Order Specific Question:   Preferred imaging location?    Answer:   [facility]    Order Specific Question:   Radiology Contrast Protocol - do NOT remove file path    Answer:   [email or web address]   Sedimentation rate    Standing Status:   Future    Number of Occurrences:   1    Standing Expiration Date:   08/10/2022    All questions were answered. The patient knows to call the clinic with any  problems, questions or concerns. The total time spent in the appointment was 30 minutes encounter with patients including review of chart and various tests results, discussions about plan of care and coordination of care plan   Mark Lark, MD 08/11/2021 7:57 AM  INTERVAL HISTORY: Please see below for problem oriented charting. Mark Decker returns for urgent evaluation Recently, Mark Decker self palpated left lymphadenopathy in the inguinal region The patient has been exercising rigorously in the gym and noticed slight pain and discomfort in the left inguinal region Mark Decker continues to have active Crohn's disease, with frequent bowel movement 4-5 times with associated rectal pain Mark Decker has occasional rectal bleeding Mark Decker denies fever or chills No abnormal weight loss or night sweats  REVIEW OF SYSTEMS:   Constitutional: Denies fevers, chills or abnormal weight loss Eyes: Denies blurriness of vision Ears, nose, mouth, throat, and face: Denies mucositis or sore throat Respiratory: Denies cough, dyspnea or wheezes Cardiovascular: Denies palpitation, chest discomfort or lower extremity swelling Skin: Denies abnormal skin rashes Neurological:Denies numbness, tingling or new weaknesses Behavioral/Psych: Mood is stable, no new changes  All other systems were reviewed with the patient and are negative.  I have reviewed the past medical history, past surgical history, social history and family history with the patient and they are unchanged from previous note.  ALLERGIES:  is allergic to amoxicillin.  MEDICATIONS:  Current Outpatient Medications  Medication Sig Dispense Refill   acetaminophen (TYLENOL) 500 MG tablet Take 1,000 mg by mouth every 6 (six) hours as needed for moderate pain or headache.     Carboxymethylcellul-Glycerin (LUBRICATING EYE DROPS OP) Place 1  drop into both eyes daily as needed (dry eyes).     Cholecalciferol (VITAMIN D) 50 MCG (2000 UT) tablet Take 6,000 Units by mouth daily.     hyoscyamine  (ANASPAZ) 0.125 MG TBDP disintergrating tablet Take 0.125 mg by mouth 4 (four) times daily as needed. cramps     Magnesium 250 MG TABS Take 250 mg by mouth daily.     Mesalamine (ASACOL) 400 MG CPDR DR capsule Take 1 capsule (400 mg total) by mouth 2 (two) times daily. 180 capsule    Multiple Vitamins-Minerals (ZINC PO) Take 2 tablets by mouth daily. With quercetin     ondansetron (ZOFRAN) 4 MG tablet Take 1 tablet (4 mg total) by mouth every 6 (six) hours. 12 tablet 0   OVER THE COUNTER MEDICATION Take 2 capsules by mouth at bedtime as needed (sleep). Stress Relief otc supplement     oxymetazoline (AFRIN) 0.05 % nasal spray Place 1 spray into both nostrils 2 (two) times daily as needed for congestion.     polyethylene glycol (MIRALAX / GLYCOLAX) 17 g packet Take 17 g by mouth daily as needed for mild constipation.     SUPER B COMPLEX/C PO Take 1 tablet by mouth daily.     TURMERIC PO Take 2 capsules by mouth daily.     No current facility-administered medications for this visit.    SUMMARY OF ONCOLOGIC HISTORY: Oncology History  Hodgkin lymphoma ([place])  03/10/2020 Pathology Results   A. LYMPH NODE, RIGHT SUBMANDIBULAR, BIOPSY:  -  Classic Hodgkin lymphoma  -  See comment   COMMENT:   The submitted lymph nodes are effaced by a vaguely nodular lymphoid proliferation separated by bands of fibrosis with admixed inflammatory cells including neutrophils and eosinophils. The lymphoid proliferation  is composed predominantly of small mature lymphocytes with scattered large atypical cells. The atypical cells have mono and multi-lobated nuclei and prominent nucleoli characteristic of Reed-Sternberg cells.  Lacunar cells are also present.   The neoplastic cells are CD30, CD15 (subset), Pax-5 (dim) and CD20 positive by immunohistochemistry. EBV by in-situ hybridization is positive in the Reed-Sternberg cells. CD3 highlights the background  T-cells.  EBV in-situ hybridization is negative.   Overall,  the morphologic and immunophenotypic findings are consistent with Classic Hodgkin lymphoma and the nodular sclerosis subtype is favored.    03/14/2020 Initial Diagnosis   Hodgkin lymphoma ([place])   03/14/2020 Cancer Staging   Staging form: Hodgkin and Non-Hodgkin Lymphoma, AJCC 8th Edition - Clinical stage from 03/14/2020: Stage I (Hodgkin lymphoma, B - Symptoms) - Signed by Mark Lark, MD on 03/14/2020    03/18/2020 Echocardiogram   1. Left ventricular ejection fraction, by estimation, is 55 to 60%. The left ventricle has normal function. The left ventricle has no regional wall motion abnormalities. Left ventricular diastolic parameters were normal. The average left ventricular global longitudinal strain is -15.9 %. The global longitudinal strain is normal.  2. Right ventricular systolic function is normal. The right ventricular size is normal. There is normal pulmonary artery systolic pressure.  3. The mitral valve is normal in structure. No evidence of mitral valve regurgitation. No evidence of mitral stenosis.  4. The aortic valve is normal in structure. Aortic valve regurgitation is not visualized. No aortic stenosis is present.  5. The inferior vena cava is normal in size with greater than 50% respiratory variability, suggesting right atrial pressure of 3 mmHg.   03/20/2020 Procedure   Placement of a subcutaneous port device. Catheter tip at the superior cavoatrial  junction.   03/24/2020 - 09/08/2020 Chemotherapy   The patient had ABVD for chemotherapy treatment.     05/16/2020 PET scan   1. Small hypermetabolic RIGHT level II lymph node is indeterminate. Activity is similar to liver activity. No comparison available. 2. Reduction in size and no significant metabolic activity of RIGHT supraclavicular lymph node ( [place] 1). 3. No evidence lymphoma chest, abdomen pelvis.  Normal spleen.  4. Intense metabolic activity localizing to LEFT paraspinal musculature in the cervical neck. Favor  benign physiologic muscle musculature in the LEFT neck. Recommend clinical correlation for muscle spasm or trauma. If concern for unlikely muscular lymphoma, consider contrast CT or MRI of the neck.   06/10/2020 Echocardiogram    1. Left ventricular ejection fraction, by estimation, is 55 to 60%. The left ventricle has normal function. The left ventricle has no regional wall motion abnormalities. Left ventricular diastolic parameters were normal. The average left ventricular global longitudinal strain is -21.6 %. The global longitudinal strain is normal.  2. Right ventricular systolic function is normal. The right ventricular size is normal. Tricuspid regurgitation signal is inadequate for assessing PA pressure.  3. The mitral valve is normal in structure. Trivial mitral valve regurgitation. No evidence of mitral stenosis.  4. The aortic valve is tricuspid. Aortic valve regurgitation is not visualized. No aortic stenosis is present.     10/10/2020 PET scan   Negative PET-CT for metabolically active lymphoma.  ([place] 1)     11/03/2020 Procedure   Successful removal of implanted Port-A-Cath.     PHYSICAL EXAMINATION: ECOG PERFORMANCE STATUS: 1 - Symptomatic but completely ambulatory  Vitals:   08/10/21 1403  BP: 120/68  Pulse: 93  Resp: 18  Temp: 98.3 F (36.8 C)  SpO2: 100%   Filed Weights   08/10/21 1403  Weight: 222 lb 6.4 oz (100.9 kg)    GENERAL:alert, no distress and comfortable SKIN: skin color, texture, turgor are normal, no rashes or significant lesions EYES: normal, Conjunctiva are pink and non-injected, sclera clear OROPHARYNX:no exudate, no erythema and lips, buccal mucosa, and tongue normal  NECK: Mark Decker has palpable lymphadenopathy in the right cervical region and left inguinal region LYMPH:  no palpable lymphadenopathy in the cervical, axillary or inguinal LUNGS: clear to auscultation and percussion with normal breathing effort HEART: regular rate & rhythm and no  murmurs and no lower extremity edema ABDOMEN:abdomen soft, non-tender and normal bowel sounds Musculoskeletal:no cyanosis of digits and no clubbing  NEURO: alert & oriented x 3 with fluent speech, no focal motor/sensory deficits  LABORATORY DATA:  I have reviewed the data as listed    Component Value Date/Time   NA 137 08/10/2021 1336   NA 141 04/27/2013 1217   K 4.0 08/10/2021 1336   K 4.0 04/27/2013 1217   CL 105 08/10/2021 1336   CL 106 04/26/2012 1550   CO2 25 08/10/2021 1336   CO2 28 04/27/2013 1217   GLUCOSE 96 08/10/2021 1336   GLUCOSE 92 04/27/2013 1217   GLUCOSE 108 (H) 04/26/2012 1550   BUN 21 (H) 08/10/2021 1336   BUN 10.2 04/27/2013 1217   CREATININE 0.81 08/10/2021 1336   CREATININE 0.84 10/09/2020 1005   CREATININE 0.8 04/27/2013 1217   CALCIUM 9.6 08/10/2021 1336   CALCIUM 9.7 04/27/2013 1217   PROT 7.4 08/10/2021 1336   PROT 7.5 04/27/2013 1217   ALBUMIN 4.4 08/10/2021 1336   ALBUMIN 3.6 04/27/2013 1217   AST 15 08/10/2021 1336   AST 22 10/09/2020 1005  AST 14 04/27/2013 1217   ALT 16 08/10/2021 1336   ALT 20 10/09/2020 1005   ALT 23 04/27/2013 1217   ALKPHOS 53 08/10/2021 1336   ALKPHOS 64 04/27/2013 1217   BILITOT 0.4 08/10/2021 1336   BILITOT 0.7 10/09/2020 1005   BILITOT 0.86 04/27/2013 1217   GFRNONAA >60 08/10/2021 1336   GFRNONAA >60 10/09/2020 1005   GFRAA >60 03/24/2020 0853    No results found for: SPEP, UPEP  Lab Results  Component Value Date   WBC 9.6 08/10/2021   NEUTROABS 6.4 08/10/2021   HGB 13.6 08/10/2021   HCT 39.6 08/10/2021   MCV 86.1 08/10/2021   PLT 306 08/10/2021      Chemistry      Component Value Date/Time   NA 137 08/10/2021 1336   NA 141 04/27/2013 1217   K 4.0 08/10/2021 1336   K 4.0 04/27/2013 1217   CL 105 08/10/2021 1336   CL 106 04/26/2012 1550   CO2 25 08/10/2021 1336   CO2 28 04/27/2013 1217   BUN 21 (H) 08/10/2021 1336   BUN 10.2 04/27/2013 1217   CREATININE 0.81 08/10/2021 1336   CREATININE  0.84 10/09/2020 1005   CREATININE 0.8 04/27/2013 1217      Component Value Date/Time   CALCIUM 9.6 08/10/2021 1336   CALCIUM 9.7 04/27/2013 1217   ALKPHOS 53 08/10/2021 1336   ALKPHOS 64 04/27/2013 1217   AST 15 08/10/2021 1336   AST 22 10/09/2020 1005   AST 14 04/27/2013 1217   ALT 16 08/10/2021 1336   ALT 20 10/09/2020 1005   ALT 23 04/27/2013 1217   BILITOT 0.4 08/10/2021 1336   BILITOT 0.7 10/09/2020 1005   BILITOT 0.86 04/27/2013 1217

## 2021-08-18 ENCOUNTER — Other Ambulatory Visit: Payer: 59

## 2021-08-18 ENCOUNTER — Ambulatory Visit: Payer: 59 | Admitting: Hematology and Oncology

## 2021-08-21 ENCOUNTER — Other Ambulatory Visit: Payer: Self-pay

## 2021-08-21 ENCOUNTER — Ambulatory Visit (HOSPITAL_COMMUNITY)
Admission: RE | Admit: 2021-08-21 | Discharge: 2021-08-21 | Disposition: A | Discharge status: Home / Self Care | Payer: 59 | Source: Ambulatory Visit | Attending: Hematology and Oncology | Admitting: Hematology and Oncology

## 2021-08-21 DIAGNOSIS — C8111 Nodular sclerosis classical Hodgkin lymphoma, lymph nodes of head, face, and neck: Secondary | ICD-10-CM | POA: Insufficient documentation

## 2021-08-21 LAB — GLUCOSE, CAPILLARY: Glucose-Capillary: 108 mg/dL — ABNORMAL HIGH (ref 70–99)

## 2021-08-21 MED ORDER — FLUDEOXYGLUCOSE F - 18 (FDG) INJECTION
10.8600 | Freq: Once | INTRAVENOUS | Status: AC
  Administered 2021-08-21: 10.86 via INTRAVENOUS

## 2021-08-24 ENCOUNTER — Inpatient Hospital Stay: Payer: 59 | Admitting: Hematology and Oncology

## 2021-08-24 ENCOUNTER — Other Ambulatory Visit: Payer: Self-pay

## 2021-08-24 DIAGNOSIS — C8190 Hodgkin lymphoma, unspecified, unspecified site: Secondary | ICD-10-CM | POA: Diagnosis not present

## 2021-08-24 DIAGNOSIS — C8111 Nodular sclerosis classical Hodgkin lymphoma, lymph nodes of head, face, and neck: Secondary | ICD-10-CM | POA: Diagnosis not present

## 2021-08-24 DIAGNOSIS — K50919 Crohn's disease, unspecified, with unspecified complications: Secondary | ICD-10-CM | POA: Diagnosis not present

## 2021-08-25 ENCOUNTER — Encounter: Payer: Self-pay | Admitting: Hematology and Oncology

## 2021-08-25 NOTE — Assessment & Plan Note (Signed)
He has significant active Crohn's disease I encouraged the patient to contact his gastroenterologist for management The patient did not tolerate oral prednisone in the past I am concerned about chronic active inflammatory disease can precipitate risk of recurrent lymphoma He expressed understanding

## 2021-08-25 NOTE — Progress Notes (Signed)
[place] OFFICE PROGRESS NOTE  Patient Care Team: [name], MD as PCP - General (Family Medicine) [name], MD as Attending Physician (Family Medicine) [name], MD (Inactive) (Gastroenterology) [name], MD as Consulting Physician (Gastroenterology)  ASSESSMENT & PLAN:  Hodgkin lymphoma [facility]) I have reviewed recent PET/CT imaging with the patient The lymphadenopathy that was palpable in the inguinal region were not PET avid The lymphadenopathy on the right side of his neck had minimum SUV activity Overall, I believe it is reactive The patient has no significant findings to suggest persistent lymphoma I plan to see him again in 3 months for further follow-up  Crohn disease [facility]) He has significant active Crohn's disease I encouraged the patient to contact his gastroenterologist for management The patient did not tolerate oral prednisone in the past I am concerned about chronic active inflammatory disease can precipitate risk of recurrent lymphoma He expressed understanding   No orders of the defined types were placed in this encounter.   All questions were answered. The patient knows to call the clinic with any problems, questions or concerns. The total time spent in the appointment was 30 minutes encounter with patients including review of chart and various tests results, discussions about plan of care and coordination of care plan   [name], MD 08/25/2021 9:01 AM  INTERVAL HISTORY: Please see below for problem oriented charting. he returns for follow-up on imaging study He continues to battle with abdominal pain, cramping, diarrhea secondary to Crohn's disease No other new lymphadenopathy Denies fever, chills or abnormal weight loss  REVIEW OF SYSTEMS:   Constitutional: Denies fevers, chills or abnormal weight loss Eyes: Denies blurriness of vision Ears, nose, mouth, throat, and face: Denies mucositis or sore  throat Respiratory: Denies cough, dyspnea or wheezes Cardiovascular: Denies palpitation, chest discomfort or lower extremity swelling Skin: Denies abnormal skin rashes Lymphatics: Denies new lymphadenopathy or easy bruising Neurological:Denies numbness, tingling or new weaknesses Behavioral/Psych: Mood is stable, no new changes  All other systems were reviewed with the patient and are negative.  I have reviewed the past medical history, past surgical history, social history and family history with the patient and they are unchanged from previous note.  ALLERGIES:  is allergic to amoxicillin.  MEDICATIONS:  Current Outpatient Medications  Medication Sig Dispense Refill   acetaminophen (TYLENOL) 500 MG tablet Take 1,000 mg by mouth every 6 (six) hours as needed for moderate pain or headache.     Carboxymethylcellul-Glycerin (LUBRICATING EYE DROPS OP) Place 1 drop into both eyes daily as needed (dry eyes).     Cholecalciferol (VITAMIN D) 50 MCG (2000 UT) tablet Take 6,000 Units by mouth daily.     hyoscyamine (ANASPAZ) 0.125 MG TBDP disintergrating tablet Take 0.125 mg by mouth 4 (four) times daily as needed. cramps     Magnesium 250 MG TABS Take 250 mg by mouth daily.     Mesalamine (ASACOL) 400 MG CPDR DR capsule Take 1 capsule (400 mg total) by mouth 2 (two) times daily. 180 capsule    Multiple Vitamins-Minerals (ZINC PO) Take 2 tablets by mouth daily. With quercetin     ondansetron (ZOFRAN) 4 MG tablet Take 1 tablet (4 mg total) by mouth every 6 (six) hours. 12 tablet 0   OVER THE COUNTER MEDICATION Take 2 capsules by mouth at bedtime as needed (sleep). Stress Relief otc supplement     oxymetazoline (AFRIN) 0.05 % nasal spray Place 1 spray into both nostrils 2 (two) times daily as  needed for congestion.     polyethylene glycol (MIRALAX / GLYCOLAX) 17 g packet Take 17 g by mouth daily as needed for mild constipation.     SUPER B COMPLEX/C PO Take 1 tablet by mouth daily.     TURMERIC PO  Take 2 capsules by mouth daily.     No current facility-administered medications for this visit.    SUMMARY OF ONCOLOGIC HISTORY: Oncology History  Hodgkin lymphoma ([place])  03/10/2020 Pathology Results   A. LYMPH NODE, RIGHT SUBMANDIBULAR, BIOPSY:  -  Classic Hodgkin lymphoma  -  See comment   COMMENT:   The submitted lymph nodes are effaced by a vaguely nodular lymphoid proliferation separated by bands of fibrosis with admixed inflammatory cells including neutrophils and eosinophils. The lymphoid proliferation  is composed predominantly of small mature lymphocytes with scattered large atypical cells. The atypical cells have mono and multi-lobated nuclei and prominent nucleoli characteristic of Reed-Sternberg cells.  Lacunar cells are also present.   The neoplastic cells are CD30, CD15 (subset), Pax-5 (dim) and CD20 positive by immunohistochemistry. EBV by in-situ hybridization is positive in the Reed-Sternberg cells. CD3 highlights the background  T-cells.  EBV in-situ hybridization is negative.   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    03/14/2020 Initial Diagnosis   Hodgkin lymphoma ([place])   03/14/2020 Cancer Staging   Staging form: Hodgkin and Non-Hodgkin Lymphoma, AJCC 8th Edition - Clinical stage from 03/14/2020: Stage I (Hodgkin lymphoma, B - Symptoms) - Signed by [name], MD on 03/14/2020    03/18/2020 Echocardiogram   1. Left ventricular ejection fraction, by estimation, is 55 to 60%. The left ventricle has normal function. The left ventricle has no regional wall motion abnormalities. Left ventricular diastolic parameters were normal. The average left ventricular global longitudinal strain is -15.9 %. The global longitudinal strain is normal.  2. Right ventricular systolic function is normal. The right ventricular size is normal. There is normal pulmonary artery systolic  pressure.  3. The mitral valve is normal in structure. No evidence of mitral valve regurgitation. No evidence of mitral stenosis.  4. The aortic valve is normal in structure. Aortic valve regurgitation is not visualized. No aortic stenosis is present.  5. The inferior vena cava is normal in size with greater than 50% respiratory variability, suggesting right atrial pressure of 3 mmHg.   03/20/2020 Procedure   Placement of a subcutaneous port device. Catheter tip at the superior cavoatrial junction.   03/24/2020 - 09/08/2020 Chemotherapy   The patient had ABVD for chemotherapy treatment.     05/16/2020 PET scan   1. Small hypermetabolic RIGHT level II lymph node is indeterminate. Activity is similar to liver activity. No comparison available. 2. Reduction in size and no significant metabolic activity of RIGHT supraclavicular lymph node ( Deauville 1). 3. No evidence lymphoma chest, abdomen pelvis.  Normal spleen.  4. Intense metabolic activity localizing to LEFT paraspinal musculature in the cervical neck. Favor benign physiologic muscle musculature in the LEFT neck. Recommend clinical correlation for muscle spasm or trauma. If concern for unlikely muscular lymphoma, consider contrast CT or MRI of the neck.   06/10/2020 Echocardiogram    1. Left ventricular ejection fraction, by estimation, is 55 to 60%. The left ventricle has normal function. The left ventricle has no regional wall motion abnormalities. Left ventricular diastolic parameters were normal. The average left ventricular global longitudinal strain is -21.6 %. The global longitudinal strain  is normal.  2. Right ventricular systolic function is normal. The right ventricular size is normal. Tricuspid regurgitation signal is inadequate for assessing PA pressure.  3. The mitral valve is normal in structure. Trivial mitral valve regurgitation. No evidence of mitral stenosis.  4. The aortic valve is tricuspid. Aortic valve regurgitation is not  visualized. No aortic stenosis is present.     10/10/2020 PET scan   Negative PET-CT for metabolically active lymphoma.  (Deauville 1)     11/03/2020 Procedure   Successful removal of implanted Port-A-Cath.   08/24/2021 PET scan   1. Mildly enlarged and mildly hypermetabolic 1.0 cm right level 2 neck lymph node, mildly increased in metabolism and not substantially changed in size since 10/09/2020 PET-CT, equivocal for recurrent Deauville score 3 Hodgkin lymphoma. 2. No additional potential findings of metabolically active Hodgkin lymphoma. 3. New mild wall thickening with associated diffuse hypermetabolism throughout the sigmoid colon, suspicious for active inflammatory colitis in this patient with history of Crohn disease per prior imaging reports.       PHYSICAL EXAMINATION: ECOG PERFORMANCE STATUS: 1 - Symptomatic but completely ambulatory  Vitals:   08/24/21 1129  BP: 126/70  Pulse: 79  Resp: 18  Temp: (!) 97.4 F (36.3 C)  SpO2: 100%   Filed Weights   08/24/21 1129  Weight: 233 lb (105.7 kg)    GENERAL:alert, no distress and comfortable NEURO: alert & oriented x 3 with fluent speech, no focal motor/sensory deficits  LABORATORY DATA:  I have reviewed the data as listed    Component Value Date/Time   NA 137 08/10/2021 1336   NA 141 04/27/2013 1217   K 4.0 08/10/2021 1336   K 4.0 04/27/2013 1217   CL 105 08/10/2021 1336   CL 106 04/26/2012 1550   CO2 25 08/10/2021 1336   CO2 28 04/27/2013 1217   GLUCOSE 96 08/10/2021 1336   GLUCOSE 92 04/27/2013 1217   GLUCOSE 108 (H) 04/26/2012 1550   BUN 21 (H) 08/10/2021 1336   BUN 10.2 04/27/2013 1217   CREATININE 0.81 08/10/2021 1336   CREATININE 0.84 10/09/2020 1005   CREATININE 0.8 04/27/2013 1217   CALCIUM 9.6 08/10/2021 1336   CALCIUM 9.7 04/27/2013 1217   PROT 7.4 08/10/2021 1336   PROT 7.5 04/27/2013 1217   ALBUMIN 4.4 08/10/2021 1336   ALBUMIN 3.6 04/27/2013 1217   AST 15 08/10/2021 1336   AST 22 10/09/2020  1005   AST 14 04/27/2013 1217   ALT 16 08/10/2021 1336   ALT 20 10/09/2020 1005   ALT 23 04/27/2013 1217   ALKPHOS 53 08/10/2021 1336   ALKPHOS 64 04/27/2013 1217   BILITOT 0.4 08/10/2021 1336   BILITOT 0.7 10/09/2020 1005   BILITOT 0.86 04/27/2013 1217   GFRNONAA >60 08/10/2021 1336   GFRNONAA >60 10/09/2020 1005   GFRAA >60 03/24/2020 0853    No results found for: SPEP, UPEP  Lab Results  Component Value Date   WBC 9.6 08/10/2021   NEUTROABS 6.4 08/10/2021   HGB 13.6 08/10/2021   HCT 39.6 08/10/2021   MCV 86.1 08/10/2021   PLT 306 08/10/2021      Chemistry      Component Value Date/Time   NA 137 08/10/2021 1336   NA 141 04/27/2013 1217   K 4.0 08/10/2021 1336   K 4.0 04/27/2013 1217   CL 105 08/10/2021 1336   CL 106 04/26/2012 1550   CO2 25 08/10/2021 1336   CO2 28 04/27/2013 1217   BUN 21 (  H) 08/10/2021 1336   BUN 10.2 04/27/2013 1217   CREATININE 0.81 08/10/2021 1336   CREATININE 0.84 10/09/2020 1005   CREATININE 0.8 04/27/2013 1217      Component Value Date/Time   CALCIUM 9.6 08/10/2021 1336   CALCIUM 9.7 04/27/2013 1217   ALKPHOS 53 08/10/2021 1336   ALKPHOS 64 04/27/2013 1217   AST 15 08/10/2021 1336   AST 22 10/09/2020 1005   AST 14 04/27/2013 1217   ALT 16 08/10/2021 1336   ALT 20 10/09/2020 1005   ALT 23 04/27/2013 1217   BILITOT 0.4 08/10/2021 1336   BILITOT 0.7 10/09/2020 1005   BILITOT 0.86 04/27/2013 1217       RADIOGRAPHIC STUDIES: I have reviewed multiple imaging studies with the patient I have personally reviewed the radiological images as listed and agreed with the findings in the report. 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 Reported new lymph node in right neck and left groin. EXAM: NUCLEAR MEDICINE PET SKULL BASE TO THIGH TECHNIQUE: 10.9 mCi F-18 FDG was injected  intravenously. Full-ring PET imaging was performed from the skull base to thigh after the radiotracer. CT data was obtained and used for attenuation correction and anatomic localization. Fasting blood glucose: 108 mg/dl COMPARISON:  05/31/2021 CT abdomen/pelvis.  10/09/2020 PET-CT. FINDINGS: Mediastinal blood pool activity: SUV max 2.0 Liver activity: SUV max 2.7 NECK: Mildly enlarged 1.0 cm right level 2 neck lymph node demonstrates mild hypermetabolism with max SUV 3.1 (series 4/image 40), previously 0.9 cm with max SUV 2.0, mildly increased in metabolism and not substantially changed in size. No additional enlarged or hypermetabolic lymph nodes in the neck. Incidental CT findings: none CHEST: No enlarged or hypermetabolic axillary, mediastinal or hilar lymph nodes. No hypermetabolic pulmonary findings. Incidental CT findings: Perifissural 0.4 cm right middle lobe nodule along the minor fissure (series 8/image 46), unchanged and considered benign. No new significant pulmonary nodules. ABDOMEN/PELVIS: No abnormal hypermetabolic activity within the liver, pancreas, adrenal glands, or spleen. No hypermetabolic lymph nodes in the abdomen or pelvis. No pathologically enlarged or hypermetabolic inguinal lymph nodes. Mild wall thickening throughout the sigmoid colon appears new and is associated with new diffuse hypermetabolism. Incidental CT findings: Normal size spleen. SKELETON: No focal hypermetabolic activity to suggest skeletal metastasis. Incidental CT findings: none IMPRESSION: 1. Mildly enlarged and mildly hypermetabolic 1.0 cm right level 2 neck lymph node, mildly increased in metabolism and not substantially changed in size since 10/09/2020 PET-CT, equivocal for recurrent Deauville score 3 Hodgkin lymphoma. 2. No additional potential findings of metabolically active Hodgkin lymphoma. 3. New mild wall thickening with associated diffuse hypermetabolism throughout the sigmoid colon, suspicious for active  inflammatory colitis in this patient with history of Crohn disease per prior imaging reports. Electronically Signed   By: Ilona Sorrel M.D.   On: 08/24/2021 08:48

## 2021-08-25 NOTE — Assessment & Plan Note (Signed)
I have reviewed recent PET/CT imaging with the patient The lymphadenopathy that was palpable in the inguinal region were not PET avid The lymphadenopathy on the right side of his neck had minimum SUV activity Overall, I believe it is reactive The patient has no significant findings to suggest persistent lymphoma I plan to see him again in 3 months for further follow-up

## 2021-10-15 ENCOUNTER — Telehealth: Payer: Self-pay

## 2021-10-15 NOTE — Telephone Encounter (Signed)
Returned his call and reviewed 5/30 appts. He verbalized understanding ?

## 2021-11-24 ENCOUNTER — Inpatient Hospital Stay: Payer: 59 | Admitting: Hematology and Oncology

## 2021-11-24 ENCOUNTER — Other Ambulatory Visit: Payer: Self-pay

## 2021-11-24 ENCOUNTER — Inpatient Hospital Stay: Payer: 59 | Attending: Hematology and Oncology

## 2021-11-24 ENCOUNTER — Encounter: Payer: Self-pay | Admitting: Hematology and Oncology

## 2021-11-24 ENCOUNTER — Other Ambulatory Visit (HOSPITAL_COMMUNITY): Payer: Self-pay

## 2021-11-24 ENCOUNTER — Ambulatory Visit (HOSPITAL_COMMUNITY)
Admission: RE | Admit: 2021-11-24 | Discharge: 2021-11-24 | Disposition: A | Discharge status: Home / Self Care | Payer: 59 | Source: Ambulatory Visit | Attending: Hematology and Oncology | Admitting: Hematology and Oncology

## 2021-11-24 ENCOUNTER — Telehealth: Payer: Self-pay

## 2021-11-24 ENCOUNTER — Other Ambulatory Visit: Payer: Self-pay | Admitting: Hematology and Oncology

## 2021-11-24 VITALS — BP 134/68 | HR 85 | Temp 98.1°F | Resp 18 | Ht 77.0 in | Wt 242.2 lb

## 2021-11-24 DIAGNOSIS — C8111 Nodular sclerosis classical Hodgkin lymphoma, lymph nodes of head, face, and neck: Secondary | ICD-10-CM | POA: Diagnosis not present

## 2021-11-24 DIAGNOSIS — C8190 Hodgkin lymphoma, unspecified, unspecified site: Secondary | ICD-10-CM | POA: Insufficient documentation

## 2021-11-24 DIAGNOSIS — K922 Gastrointestinal hemorrhage, unspecified: Secondary | ICD-10-CM | POA: Diagnosis not present

## 2021-11-24 DIAGNOSIS — R110 Nausea: Secondary | ICD-10-CM | POA: Insufficient documentation

## 2021-11-24 DIAGNOSIS — D500 Iron deficiency anemia secondary to blood loss (chronic): Secondary | ICD-10-CM | POA: Diagnosis not present

## 2021-11-24 DIAGNOSIS — D539 Nutritional anemia, unspecified: Secondary | ICD-10-CM

## 2021-11-24 DIAGNOSIS — R051 Acute cough: Secondary | ICD-10-CM

## 2021-11-24 DIAGNOSIS — R059 Cough, unspecified: Secondary | ICD-10-CM | POA: Diagnosis not present

## 2021-11-24 DIAGNOSIS — R590 Localized enlarged lymph nodes: Secondary | ICD-10-CM

## 2021-11-24 LAB — CBC WITH DIFFERENTIAL/PLATELET
Abs Immature Granulocytes: 0.03 10*3/uL (ref 0.00–0.07)
Basophils Absolute: 0.1 10*3/uL (ref 0.0–0.1)
Basophils Relative: 1 %
Eosinophils Absolute: 0.4 10*3/uL (ref 0.0–0.5)
Eosinophils Relative: 4 %
HCT: 37.6 % — ABNORMAL LOW (ref 39.0–52.0)
Hemoglobin: 12.7 g/dL — ABNORMAL LOW (ref 13.0–17.0)
Immature Granulocytes: 0 %
Lymphocytes Relative: 23 %
Lymphs Abs: 2.4 10*3/uL (ref 0.7–4.0)
MCH: 29.1 pg (ref 26.0–34.0)
MCHC: 33.8 g/dL (ref 30.0–36.0)
MCV: 86.2 fL (ref 80.0–100.0)
Monocytes Absolute: 1.5 10*3/uL — ABNORMAL HIGH (ref 0.1–1.0)
Monocytes Relative: 15 %
Neutro Abs: 5.9 10*3/uL (ref 1.7–7.7)
Neutrophils Relative %: 57 %
Platelets: 358 10*3/uL (ref 150–400)
RBC: 4.36 MIL/uL (ref 4.22–5.81)
RDW: 12.7 % (ref 11.5–15.5)
WBC: 10.3 10*3/uL (ref 4.0–10.5)
nRBC: 0 % (ref 0.0–0.2)

## 2021-11-24 LAB — COMPREHENSIVE METABOLIC PANEL
ALT: 13 U/L (ref 0–44)
AST: 12 U/L — ABNORMAL LOW (ref 15–41)
Albumin: 3.8 g/dL (ref 3.5–5.0)
Alkaline Phosphatase: 54 U/L (ref 38–126)
Anion gap: 4 — ABNORMAL LOW (ref 5–15)
BUN: 15 mg/dL (ref 6–20)
CO2: 30 mmol/L (ref 22–32)
Calcium: 9.2 mg/dL (ref 8.9–10.3)
Chloride: 105 mmol/L (ref 98–111)
Creatinine, Ser: 0.92 mg/dL (ref 0.61–1.24)
GFR, Estimated: >60 mL/min (ref >60)
Glucose, Bld: 66 mg/dL — ABNORMAL LOW (ref 70–99)
Potassium: 3.8 mmol/L (ref 3.5–5.1)
Sodium: 139 mmol/L (ref 135–145)
Total Bilirubin: 0.4 mg/dL (ref 0.3–1.2)
Total Protein: 7.1 g/dL (ref 6.5–8.1)

## 2021-11-24 MED ORDER — BUDESONIDE 3 MG PO CPEP
9.0000 mg | ORAL_CAPSULE | Freq: Every day | ORAL | 1 refills | Status: DC
  Filled 2021-11-24: qty 7, 2d supply, fill #0

## 2021-11-24 MED ORDER — BUDESONIDE 3 MG PO CPEP: 9.0000 mg | ORAL_CAPSULE | Freq: Every day | ORAL | 1 refills | Status: DC

## 2021-11-24 NOTE — Assessment & Plan Note (Signed)
He has new onset of anemia due to GI bleed I will recheck iron studies in his next visit I recommend a short course of B[place] I recommend the patient to reach out to his gastroenterologist for further management

## 2021-11-24 NOTE — Assessment & Plan Note (Signed)
He has recent cough I recommend chest x-ray today We will call him with test results

## 2021-11-24 NOTE — Telephone Encounter (Signed)
Called per [name], chest xray good and no need for antibiotics. He verbalized understanding.  Sent Budesonide Rx to [facility] to make 3 capsules daily for 7 days.

## 2021-11-24 NOTE — Progress Notes (Signed)
Mark Decker OFFICE PROGRESS NOTE  Patient Care Team: Mark Jordan, MD as PCP - General (Family Medicine) Mark Jordan, MD as Attending Physician (Family Medicine) Mark Irani, MD (Inactive) (Gastroenterology) Mark Corner, MD as Consulting Physician (Gastroenterology)  ASSESSMENT & PLAN:  Hodgkin lymphoma [facility]) His examination is benign I suspect his recent fever and chills and night sweats are not related to recurrent lymphoma, likely due to his recurrent Crohn's disease I will see him again in 3 months for further follow-up If his symptoms recur again, I will repeat imaging study  Deficiency anemia He has new onset of anemia due to GI bleed I will recheck iron studies in his next visit I recommend a short course of [place] I recommend the patient to reach out to his gastroenterologist for further management  Cough in adult He has recent cough I recommend chest x-ray today We will call him with test results  Orders Placed This Encounter  Procedures   DG Chest 2 View    Standing Status:   Future    Number of Occurrences:   1    Standing Expiration Date:   11/24/2022    Order Specific Question:   Reason for Exam (SYMPTOM  OR DIAGNOSIS REQUIRED)    Answer:   cough and fever, Hx of lymphoma    Order Specific Question:   Preferred imaging location?    Answer:   [facility]   Iron and Iron Binding Capacity ([facility] only)    Standing Status:   Future    Standing Expiration Date:   11/25/2022   Ferritin    Standing Status:   Future    Standing Expiration Date:   11/24/2022   Sedimentation rate    Standing Status:   Future    Standing Expiration Date:   11/25/2022    All questions were answered. The patient knows to call the clinic with any problems, questions or concerns. The total time spent in the appointment was 30 minutes encounter with patients including review of chart and various tests results, discussions about plan of care and  coordination of care plan   Mark Lark, MD 11/24/2021 12:51 PM  INTERVAL HISTORY: Please see below for problem oriented charting. he returns for surveillance follow-up He is not doing well His Crohn's disease is not under control He had 6-8 bowel movements a day and at least 3-5 times of bloody diarrhea He has severe abdominal pain He had recent fever and chills with night sweats with temperature as high as 99.8 that resolved with acetaminophen He has some mild nonspecific dry cough and some sore throat He had occasional nausea  REVIEW OF SYSTEMS:   Eyes: Denies blurriness of vision Ears, nose, mouth, throat, and face: Denies mucositis or sore throat Cardiovascular: Denies palpitation, chest discomfort or lower extremity swelling Skin: Denies abnormal skin rashes Lymphatics: Denies new lymphadenopathy or easy bruising Neurological:Denies numbness, tingling or new weaknesses Behavioral/Psych: Mood is stable, no new changes  All other systems were reviewed with the patient and are negative.  I have reviewed the past medical history, past surgical history, social history and family history with the patient and they are unchanged from previous note.  ALLERGIES:  is allergic to amoxicillin.  MEDICATIONS:  Current Outpatient Medications  Medication Sig Dispense Refill   budesonide (ENTOCORT EC) 3 MG 24 hr capsule Take 3 capsules (9 mg total) by mouth daily. 7 capsule 1   acetaminophen (TYLENOL) 500 MG tablet Take 1,000 mg by mouth every  6 (six) hours as needed for moderate pain or headache.     Carboxymethylcellul-Glycerin (LUBRICATING EYE DROPS OP) Place 1 drop into both eyes daily as needed (dry eyes).     Cholecalciferol (VITAMIN D) 50 MCG (2000 UT) tablet Take 6,000 Units by mouth daily.     hyoscyamine (ANASPAZ) 0.125 MG TBDP disintergrating tablet Take 0.125 mg by mouth 4 (four) times daily as needed. cramps     Magnesium 250 MG TABS Take 250 mg by mouth daily.     Mesalamine  (ASACOL) 400 MG CPDR DR capsule Take 1 capsule (400 mg total) by mouth 2 (two) times daily. 180 capsule    Multiple Vitamins-Minerals (ZINC PO) Take 2 tablets by mouth daily. With quercetin     ondansetron (ZOFRAN) 4 MG tablet Take 1 tablet (4 mg total) by mouth every 6 (six) hours. 12 tablet 0   OVER THE COUNTER MEDICATION Take 2 capsules by mouth at bedtime as needed (sleep). Stress Relief otc supplement     oxymetazoline (AFRIN) 0.05 % nasal spray Place 1 spray into both nostrils 2 (two) times daily as needed for congestion.     polyethylene glycol (MIRALAX / GLYCOLAX) 17 g packet Take 17 g by mouth daily as needed for mild constipation.     SUPER B COMPLEX/C PO Take 1 tablet by mouth daily.     TURMERIC PO Take 2 capsules by mouth daily.     No current facility-administered medications for this visit.    SUMMARY OF ONCOLOGIC HISTORY: Oncology History  Hodgkin lymphoma ([place])  03/10/2020 Pathology Results   A. LYMPH NODE, RIGHT SUBMANDIBULAR, BIOPSY:  -  Classic Hodgkin lymphoma  -  See comment   COMMENT:   The submitted lymph nodes are effaced by a vaguely nodular lymphoid proliferation separated by bands of fibrosis with admixed inflammatory cells including neutrophils and eosinophils. The lymphoid proliferation  is composed predominantly of small mature lymphocytes with scattered large atypical cells. The atypical cells have mono and multi-lobated nuclei and prominent nucleoli characteristic of Reed-Sternberg cells.  Lacunar cells are also present.   The neoplastic cells are CD30, CD15 (subset), Pax-5 (dim) and CD20 positive by immunohistochemistry. EBV by in-situ hybridization is positive in the Reed-Sternberg cells. CD3 highlights the background  T-cells.  EBV in-situ hybridization is negative.   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    03/14/2020 Initial Diagnosis   Hodgkin  lymphoma ([place])    03/14/2020 Cancer Staging   Staging form: Hodgkin and Non-Hodgkin Lymphoma, AJCC 8th Edition - Clinical stage from 03/14/2020: Stage I (Hodgkin lymphoma, B - Symptoms) - Signed by Mark Lark, MD on 03/14/2020    03/18/2020 Echocardiogram   1. Left ventricular ejection fraction, by estimation, is 55 to 60%. The left ventricle has normal function. The left ventricle has no regional wall motion abnormalities. Left ventricular diastolic parameters were normal. The average left ventricular global longitudinal strain is -15.9 %. The global longitudinal strain is normal.  2. Right ventricular systolic function is normal. The right ventricular size is normal. There is normal pulmonary artery systolic pressure.  3. The mitral valve is normal in structure. No evidence of mitral valve regurgitation. No evidence of mitral stenosis.  4. The aortic valve is normal in structure. Aortic valve regurgitation is not visualized. No aortic stenosis is present.  5. The inferior vena cava is normal in size with greater than 50% respiratory variability, suggesting right atrial  pressure of 3 mmHg.   03/20/2020 Procedure   Placement of a subcutaneous port device. Catheter tip at the superior cavoatrial junction.   03/24/2020 - 09/08/2020 Chemotherapy   The patient had ABVD for chemotherapy treatment.     05/16/2020 PET scan   1. Small hypermetabolic RIGHT level II lymph node is indeterminate. Activity is similar to liver activity. No comparison available. 2. Reduction in size and no significant metabolic activity of RIGHT supraclavicular lymph node ( Deauville 1). 3. No evidence lymphoma chest, abdomen pelvis.  Normal spleen.  4. Intense metabolic activity localizing to LEFT paraspinal musculature in the cervical neck. Favor benign physiologic muscle musculature in the LEFT neck. Recommend clinical correlation for muscle spasm or trauma. If concern for unlikely muscular lymphoma, consider contrast CT or  MRI of the neck.   06/10/2020 Echocardiogram    1. Left ventricular ejection fraction, by estimation, is 55 to 60%. The left ventricle has normal function. The left ventricle has no regional wall motion abnormalities. Left ventricular diastolic parameters were normal. The average left ventricular global longitudinal strain is -21.6 %. The global longitudinal strain is normal.  2. Right ventricular systolic function is normal. The right ventricular size is normal. Tricuspid regurgitation signal is inadequate for assessing PA pressure.  3. The mitral valve is normal in structure. Trivial mitral valve regurgitation. No evidence of mitral stenosis.  4. The aortic valve is tricuspid. Aortic valve regurgitation is not visualized. No aortic stenosis is present.     10/10/2020 PET scan   Negative PET-CT for metabolically active lymphoma.  (Deauville 1)     11/03/2020 Procedure   Successful removal of implanted Port-A-Cath.   08/24/2021 PET scan   1. Mildly enlarged and mildly hypermetabolic 1.0 cm right level 2 neck lymph node, mildly increased in metabolism and not substantially changed in size since 10/09/2020 PET-CT, equivocal for recurrent Deauville score 3 Hodgkin lymphoma. 2. No additional potential findings of metabolically active Hodgkin lymphoma. 3. New mild wall thickening with associated diffuse hypermetabolism throughout the sigmoid colon, suspicious for active inflammatory colitis in this patient with history of Crohn disease per prior imaging reports.       PHYSICAL EXAMINATION: ECOG PERFORMANCE STATUS: 1 - Symptomatic but completely ambulatory  Vitals:   11/24/21 1054  BP: 134/68  Pulse: 85  Resp: 18  Temp: 98.1 F (36.7 C)  SpO2: 100%   Filed Weights   11/24/21 1054  Weight: 242 lb 3.2 oz (109.9 kg)    GENERAL:alert, no distress and comfortable SKIN: skin color, texture, turgor are normal, no rashes or significant lesions EYES: normal, Conjunctiva are pink and  non-injected, sclera clear OROPHARYNX:no exudate, no erythema and lips, buccal mucosa, and tongue normal  NECK: supple, thyroid normal size, non-tender, without nodularity LYMPH:  no palpable lymphadenopathy in the cervical, axillary or inguinal LUNGS: clear to auscultation and percussion with normal breathing effort HEART: regular rate & rhythm and no murmurs and no lower extremity edema ABDOMEN:abdomen soft, non-tender and normal bowel sounds Musculoskeletal:no cyanosis of digits and no clubbing  NEURO: alert & oriented x 3 with fluent speech, no focal motor/sensory deficits  LABORATORY DATA:  I have reviewed the data as listed    Component Value Date/Time   NA 139 11/24/2021 1039   NA 141 04/27/2013 1217   K 3.8 11/24/2021 1039   K 4.0 04/27/2013 1217   CL 105 11/24/2021 1039   CL 106 04/26/2012 1550   CO2 30 11/24/2021 1039   CO2 28 04/27/2013 1217  GLUCOSE 66 (L) 11/24/2021 1039   GLUCOSE 92 04/27/2013 1217   GLUCOSE 108 (H) 04/26/2012 1550   BUN 15 11/24/2021 1039   BUN 10.2 04/27/2013 1217   CREATININE 0.92 11/24/2021 1039   CREATININE 0.84 10/09/2020 1005   CREATININE 0.8 04/27/2013 1217   CALCIUM 9.2 11/24/2021 1039   CALCIUM 9.7 04/27/2013 1217   PROT 7.1 11/24/2021 1039   PROT 7.5 04/27/2013 1217   ALBUMIN 3.8 11/24/2021 1039   ALBUMIN 3.6 04/27/2013 1217   AST 12 (L) 11/24/2021 1039   AST 22 10/09/2020 1005   AST 14 04/27/2013 1217   ALT 13 11/24/2021 1039   ALT 20 10/09/2020 1005   ALT 23 04/27/2013 1217   ALKPHOS 54 11/24/2021 1039   ALKPHOS 64 04/27/2013 1217   BILITOT 0.4 11/24/2021 1039   BILITOT 0.7 10/09/2020 1005   BILITOT 0.86 04/27/2013 1217   GFRNONAA >60 11/24/2021 1039   GFRNONAA >60 10/09/2020 1005   GFRAA >60 03/24/2020 0853    No results found for: SPEP, UPEP  Lab Results  Component Value Date   WBC 10.3 11/24/2021   NEUTROABS 5.9 11/24/2021   HGB 12.7 (L) 11/24/2021   HCT 37.6 (L) 11/24/2021   MCV 86.2 11/24/2021   PLT 358  11/24/2021      Chemistry      Component Value Date/Time   NA 139 11/24/2021 1039   NA 141 04/27/2013 1217   K 3.8 11/24/2021 1039   K 4.0 04/27/2013 1217   CL 105 11/24/2021 1039   CL 106 04/26/2012 1550   CO2 30 11/24/2021 1039   CO2 28 04/27/2013 1217   BUN 15 11/24/2021 1039   BUN 10.2 04/27/2013 1217   CREATININE 0.92 11/24/2021 1039   CREATININE 0.84 10/09/2020 1005   CREATININE 0.8 04/27/2013 1217      Component Value Date/Time   CALCIUM 9.2 11/24/2021 1039   CALCIUM 9.7 04/27/2013 1217   ALKPHOS 54 11/24/2021 1039   ALKPHOS 64 04/27/2013 1217   AST 12 (L) 11/24/2021 1039   AST 22 10/09/2020 1005   AST 14 04/27/2013 1217   ALT 13 11/24/2021 1039   ALT 20 10/09/2020 1005   ALT 23 04/27/2013 1217   BILITOT 0.4 11/24/2021 1039   BILITOT 0.7 10/09/2020 1005   BILITOT 0.86 04/27/2013 1217

## 2021-11-24 NOTE — Assessment & Plan Note (Addendum)
His examination is benign I suspect his recent fever and chills and night sweats are not related to recurrent lymphoma, likely due to his recurrent Crohn's disease I will see him again in 3 months for further follow-up If his symptoms recur again, I will repeat imaging study

## 2021-11-25 ENCOUNTER — Telehealth: Payer: Self-pay

## 2021-11-25 NOTE — Telephone Encounter (Signed)
Notified Patient of response received from [facility]. Prior Authorization is not required for medication. Medication is on the plan's list of covered drugs. Advised Patient to call Pharmacy and request that medication be filled today because he had only 1 pill left. Provided Patient with telephone number to [facility] pharmacy help desk [phone]). No other needs or concerns voiced at this time.

## 2021-11-26 ENCOUNTER — Telehealth: Payer: Self-pay

## 2021-11-26 ENCOUNTER — Other Ambulatory Visit: Payer: Self-pay

## 2021-11-26 MED ORDER — BUDESONIDE 3 MG PO CPEP: 9.0000 mg | ORAL_CAPSULE | Freq: Every day | ORAL | 1 refills | Status: DC

## 2021-11-26 NOTE — Telephone Encounter (Signed)
Returned his call. Verifying which pharmacy he prefers Entocort sent to. Ask him to call the office back.

## 2021-11-26 NOTE — Telephone Encounter (Signed)
Called him and sent Rx to his preferred pharmacy. His insurance prefers [employer].

## 2021-12-02 ENCOUNTER — Telehealth: Payer: Self-pay | Admitting: *Deleted

## 2021-12-02 NOTE — Telephone Encounter (Signed)
He wanted to let [name] know that the Budesonide didn't help as much as hoped. It helped for the first few days as the bleeding stopped. The bloating has not subsided and he's still having pain with BMs, even with using enema. He called gastroenterologist Dr. Michail Sermon earlier today and is waiting on response. Message/info routed to MD

## 2021-12-03 NOTE — Telephone Encounter (Signed)
I do not have anything new to suggest except trying oral prednisone  If he is able to get hold of his GI doctor, that would be the best But if he wants to try oral prednisone I can send it in

## 2021-12-21 ENCOUNTER — Other Ambulatory Visit (HOSPITAL_COMMUNITY): Payer: Self-pay | Admitting: Gastroenterology

## 2021-12-21 ENCOUNTER — Other Ambulatory Visit: Payer: Self-pay | Admitting: Gastroenterology

## 2021-12-21 DIAGNOSIS — R1031 Right lower quadrant pain: Secondary | ICD-10-CM

## 2021-12-22 ENCOUNTER — Ambulatory Visit (HOSPITAL_COMMUNITY)
Admission: RE | Admit: 2021-12-22 | Discharge: 2021-12-22 | Disposition: A | Discharge status: Home / Self Care | Payer: 59 | Source: Ambulatory Visit | Attending: Gastroenterology | Admitting: Gastroenterology

## 2021-12-22 DIAGNOSIS — R1031 Right lower quadrant pain: Secondary | ICD-10-CM | POA: Diagnosis present

## 2021-12-22 MED ORDER — BARIUM SULFATE 0.1 % PO SUSP
ORAL | Status: AC
  Administered 2021-12-22: 1 mL
  Filled 2021-12-22: qty 3

## 2021-12-22 MED ORDER — IOHEXOL 300 MG/ML  SOLN
100.0000 mL | Freq: Once | INTRAMUSCULAR | Status: AC | PRN
  Administered 2021-12-22: 100 mL via INTRAVENOUS

## 2021-12-25 ENCOUNTER — Telehealth: Payer: Self-pay

## 2021-12-25 NOTE — Telephone Encounter (Signed)
He called and left a message. He had CT earlier this week and it showed no active inflammatory disease. It did show constipation.

## 2021-12-28 NOTE — Telephone Encounter (Signed)
That's good news I hope his GI doctor has recommendations to help him feel better

## 2021-12-28 NOTE — Telephone Encounter (Signed)
Called and given below message. He verbalized understanding and appreciated the call.

## 2022-01-25 ENCOUNTER — Ambulatory Visit
Admission: RE | Admit: 2022-01-25 | Discharge: 2022-01-25 | Disposition: A | Discharge status: Home / Self Care | Payer: 59 | Source: Ambulatory Visit | Attending: Physician Assistant | Admitting: Physician Assistant

## 2022-01-25 ENCOUNTER — Other Ambulatory Visit: Payer: Self-pay | Admitting: Physician Assistant

## 2022-01-25 DIAGNOSIS — R1084 Generalized abdominal pain: Secondary | ICD-10-CM

## 2022-01-25 DIAGNOSIS — K5900 Constipation, unspecified: Secondary | ICD-10-CM

## 2022-01-26 ENCOUNTER — Encounter: Payer: Self-pay | Admitting: Hematology and Oncology

## 2022-01-27 ENCOUNTER — Encounter (HOSPITAL_COMMUNITY): Payer: Self-pay

## 2022-01-27 ENCOUNTER — Emergency Department (HOSPITAL_COMMUNITY): Payer: 59

## 2022-01-27 ENCOUNTER — Inpatient Hospital Stay (HOSPITAL_COMMUNITY)
Admission: EM | Admit: 2022-01-27 | Discharge: 2022-01-29 | DRG: 386 | Disposition: A | Discharge status: Home / Self Care | Payer: 59 | Attending: Internal Medicine | Admitting: Internal Medicine

## 2022-01-27 ENCOUNTER — Other Ambulatory Visit: Payer: Self-pay

## 2022-01-27 DIAGNOSIS — D509 Iron deficiency anemia, unspecified: Secondary | ICD-10-CM | POA: Diagnosis not present

## 2022-01-27 DIAGNOSIS — K50919 Crohn's disease, unspecified, with unspecified complications: Secondary | ICD-10-CM

## 2022-01-27 DIAGNOSIS — E876 Hypokalemia: Secondary | ICD-10-CM | POA: Diagnosis not present

## 2022-01-27 DIAGNOSIS — Z79899 Other long term (current) drug therapy: Secondary | ICD-10-CM | POA: Diagnosis not present

## 2022-01-27 DIAGNOSIS — C8190 Hodgkin lymphoma, unspecified, unspecified site: Secondary | ICD-10-CM | POA: Diagnosis present

## 2022-01-27 DIAGNOSIS — K922 Gastrointestinal hemorrhage, unspecified: Secondary | ICD-10-CM | POA: Diagnosis not present

## 2022-01-27 DIAGNOSIS — K50811 Crohn's disease of both small and large intestine with rectal bleeding: Secondary | ICD-10-CM | POA: Diagnosis not present

## 2022-01-27 DIAGNOSIS — E860 Dehydration: Secondary | ICD-10-CM | POA: Diagnosis present

## 2022-01-27 DIAGNOSIS — K5900 Constipation, unspecified: Secondary | ICD-10-CM | POA: Diagnosis not present

## 2022-01-27 DIAGNOSIS — K219 Gastro-esophageal reflux disease without esophagitis: Secondary | ICD-10-CM | POA: Diagnosis present

## 2022-01-27 DIAGNOSIS — C8111 Nodular sclerosis classical Hodgkin lymphoma, lymph nodes of head, face, and neck: Secondary | ICD-10-CM | POA: Diagnosis not present

## 2022-01-27 DIAGNOSIS — Z880 Allergy status to penicillin: Secondary | ICD-10-CM | POA: Diagnosis not present

## 2022-01-27 DIAGNOSIS — Z7952 Long term (current) use of systemic steroids: Secondary | ICD-10-CM | POA: Diagnosis not present

## 2022-01-27 DIAGNOSIS — K5090 Crohn's disease, unspecified, without complications: Secondary | ICD-10-CM | POA: Diagnosis present

## 2022-01-27 LAB — URINALYSIS, ROUTINE W REFLEX MICROSCOPIC
Bilirubin Urine: NEGATIVE
Glucose, UA: NEGATIVE mg/dL
Ketones, ur: NEGATIVE mg/dL
Leukocytes,Ua: NEGATIVE
Nitrite: NEGATIVE
Protein, ur: NEGATIVE mg/dL
Specific Gravity, Urine: 1.026 (ref 1.005–1.030)
pH: 5 (ref 5.0–8.0)

## 2022-01-27 LAB — CBC WITH DIFFERENTIAL/PLATELET
Abs Immature Granulocytes: 0.05 10*3/uL (ref 0.00–0.07)
Basophils Absolute: 0.1 10*3/uL (ref 0.0–0.1)
Basophils Relative: 1 %
Eosinophils Absolute: 0.1 10*3/uL (ref 0.0–0.5)
Eosinophils Relative: 1 %
HCT: 34 % — ABNORMAL LOW (ref 39.0–52.0)
Hemoglobin: 11.1 g/dL — ABNORMAL LOW (ref 13.0–17.0)
Immature Granulocytes: 0 %
Lymphocytes Relative: 14 %
Lymphs Abs: 1.8 10*3/uL (ref 0.7–4.0)
MCH: 27.5 pg (ref 26.0–34.0)
MCHC: 32.6 g/dL (ref 30.0–36.0)
MCV: 84.4 fL (ref 80.0–100.0)
Monocytes Absolute: 2.4 10*3/uL — ABNORMAL HIGH (ref 0.1–1.0)
Monocytes Relative: 19 %
Neutro Abs: 8.3 10*3/uL — ABNORMAL HIGH (ref 1.7–7.7)
Neutrophils Relative %: 65 %
Platelets: 402 10*3/uL — ABNORMAL HIGH (ref 150–400)
RBC: 4.03 MIL/uL — ABNORMAL LOW (ref 4.22–5.81)
RDW: 13.7 % (ref 11.5–15.5)
WBC: 12.8 10*3/uL — ABNORMAL HIGH (ref 4.0–10.5)
nRBC: 0 % (ref 0.0–0.2)

## 2022-01-27 LAB — COMPREHENSIVE METABOLIC PANEL
ALT: 18 U/L (ref 0–44)
AST: 13 U/L — ABNORMAL LOW (ref 15–41)
Albumin: 3.1 g/dL — ABNORMAL LOW (ref 3.5–5.0)
Alkaline Phosphatase: 41 U/L (ref 38–126)
Anion gap: 8 (ref 5–15)
BUN: 17 mg/dL (ref 6–20)
CO2: 25 mmol/L (ref 22–32)
Calcium: 8.6 mg/dL — ABNORMAL LOW (ref 8.9–10.3)
Chloride: 104 mmol/L (ref 98–111)
Creatinine, Ser: 0.89 mg/dL (ref 0.61–1.24)
GFR, Estimated: >60 mL/min (ref >60)
Glucose, Bld: 102 mg/dL — ABNORMAL HIGH (ref 70–99)
Potassium: 3.2 mmol/L — ABNORMAL LOW (ref 3.5–5.1)
Sodium: 137 mmol/L (ref 135–145)
Total Bilirubin: 0.5 mg/dL (ref 0.3–1.2)
Total Protein: 6.7 g/dL (ref 6.5–8.1)

## 2022-01-27 LAB — PHOSPHORUS: Phosphorus: 2.9 mg/dL (ref 2.5–4.6)

## 2022-01-27 LAB — CBC
HCT: 31.7 % — ABNORMAL LOW (ref 39.0–52.0)
Hemoglobin: 10 g/dL — ABNORMAL LOW (ref 13.0–17.0)
MCH: 26.8 pg (ref 26.0–34.0)
MCHC: 31.5 g/dL (ref 30.0–36.0)
MCV: 85 fL (ref 80.0–100.0)
Platelets: 383 10*3/uL (ref 150–400)
RBC: 3.73 MIL/uL — ABNORMAL LOW (ref 4.22–5.81)
RDW: 14 % (ref 11.5–15.5)
WBC: 10.6 10*3/uL — ABNORMAL HIGH (ref 4.0–10.5)
nRBC: 0 % (ref 0.0–0.2)

## 2022-01-27 LAB — TYPE AND SCREEN
ABO/RH(D): A POS
Antibody Screen: NEGATIVE

## 2022-01-27 LAB — MAGNESIUM: Magnesium: 2 mg/dL (ref 1.7–2.4)

## 2022-01-27 LAB — CK: Total CK: 39 U/L — ABNORMAL LOW (ref 49–397)

## 2022-01-27 LAB — LIPASE, BLOOD: Lipase: 33 U/L (ref 11–51)

## 2022-01-27 MED ORDER — HYDROMORPHONE HCL 1 MG/ML IJ SOLN
1.0000 mg | Freq: Once | INTRAMUSCULAR | Status: AC
  Administered 2022-01-27: 1 mg via INTRAVENOUS
  Filled 2022-01-27: qty 1

## 2022-01-27 MED ORDER — MESALAMINE 400 MG PO CPDR
800.0000 mg | DELAYED_RELEASE_CAPSULE | Freq: Three times a day (TID) | ORAL | Status: DC
  Administered 2022-01-27 – 2022-01-29 (×5): 800 mg via ORAL
  Filled 2022-01-27 (×6): qty 2

## 2022-01-27 MED ORDER — METHYLPREDNISOLONE SODIUM SUCC 40 MG IJ SOLR
40.0000 mg | Freq: Every day | INTRAMUSCULAR | Status: DC
  Administered 2022-01-28 – 2022-01-29 (×2): 40 mg via INTRAVENOUS
  Filled 2022-01-27 (×2): qty 1

## 2022-01-27 MED ORDER — POTASSIUM CHLORIDE 10 MEQ/100ML IV SOLN
10.0000 meq | INTRAVENOUS | Status: AC
  Administered 2022-01-27 (×2): 10 meq via INTRAVENOUS
  Filled 2022-01-27 (×2): qty 100

## 2022-01-27 MED ORDER — ACETAMINOPHEN 325 MG PO TABS
650.0000 mg | ORAL_TABLET | Freq: Four times a day (QID) | ORAL | Status: DC | PRN
  Administered 2022-01-27: 650 mg via ORAL
  Filled 2022-01-27: qty 2

## 2022-01-27 MED ORDER — POTASSIUM CHLORIDE CRYS ER 20 MEQ PO TBCR
40.0000 meq | EXTENDED_RELEASE_TABLET | Freq: Once | ORAL | Status: AC
  Administered 2022-01-27: 40 meq via ORAL
  Filled 2022-01-27: qty 2

## 2022-01-27 MED ORDER — ONDANSETRON HCL 4 MG/2ML IJ SOLN
4.0000 mg | Freq: Once | INTRAMUSCULAR | Status: AC
  Administered 2022-01-27: 4 mg via INTRAVENOUS
  Filled 2022-01-27: qty 2

## 2022-01-27 MED ORDER — ACETAMINOPHEN 650 MG RE SUPP: 650.0000 mg | Freq: Four times a day (QID) | RECTAL | Status: DC | PRN

## 2022-01-27 MED ORDER — MORPHINE SULFATE (PF) 2 MG/ML IV SOLN
2.0000 mg | INTRAVENOUS | Status: DC | PRN
  Administered 2022-01-27 – 2022-01-28 (×3): 2 mg via INTRAVENOUS
  Filled 2022-01-27 (×3): qty 1

## 2022-01-27 MED ORDER — SODIUM CHLORIDE 0.9 % IV BOLUS
1000.0000 mL | Freq: Once | INTRAVENOUS | Status: AC
Start: 2022-01-27 — End: 2022-01-27
  Administered 2022-01-27: 1000 mL via INTRAVENOUS

## 2022-01-27 MED ORDER — SODIUM CHLORIDE 0.9 % IV SOLN: INTRAVENOUS | Status: DC

## 2022-01-27 MED ORDER — IOHEXOL 300 MG/ML  SOLN
100.0000 mL | Freq: Once | INTRAMUSCULAR | Status: AC | PRN
  Administered 2022-01-27: 100 mL via INTRAVENOUS

## 2022-01-27 NOTE — ED Triage Notes (Signed)
Pt reports he has been dealing with a chron's flair up for the past month. States he has began having blood in his stool over the past couple of days now.

## 2022-01-27 NOTE — Assessment & Plan Note (Signed)
-   Suspect [facility] source - Admit  For further management given:    comorbid illness rebleeding    most likely related to IBD        -  ER  Provider spoke to gastroenterology  [facility] ) they will see patient in a.m. appreciate their consult   - serial CBC.    - Monitor for any recurrence,  evidence of hemodynamic instability or significant blood loss -  type and screen,  - Transfuse as needed for hemoglobin below 7 or <9 if evidence of significant  bleeding  - Establish at least 2 PIV and fluid resuscitate   - clear liquids for tonight keep nothing by mouth post midnight,

## 2022-01-27 NOTE — ED Notes (Signed)
Pt had bright red blood in stool. EDP aware.

## 2022-01-27 NOTE — Assessment & Plan Note (Signed)
Appreciate [facility] consult Cont mesalamine and change prednisone to solumedrol 40 mg IV in AM defer to gi if need to increase the dose

## 2022-01-27 NOTE — ED Notes (Signed)
Occult card positive.

## 2022-01-27 NOTE — Assessment & Plan Note (Signed)
Sp chemo followed by [name]  Now in remission

## 2022-01-27 NOTE — ED Provider Triage Note (Signed)
Emergency Medicine Provider Triage Evaluation Note  Mark Decker , a 26 y.o. male  was evaluated in triage.  Pt complains of abdominal pain x a couple of days.  Complaining of blood in his stool for the last 2 days.  He reports "a good amount of blood on the toilet bowl ".  He does have an ongoing history of Crohn's, is on daily prednisone 40 mg.  He is followed by [facility] Dr. Michail Sermon.  No fever.  Review of Systems  Positive: Abdominal pain, blood in stool Negative: Fever, vomiting  Physical Exam  BP 136/72 (BP Location: Right Arm)   Pulse (!) 115   Temp 98.1 F (36.7 C) (Oral)   Resp 16   SpO2 100%  Gen:   Awake, no distress   Resp:  Normal effort  MSK:   Moves extremities without difficulty  Other:    Medical Decision Making  Medically screening exam initiated at 11:08 AM.  Appropriate orders placed.  Mark Decker was informed that the remainder of the evaluation will be completed by another provider, this initial triage assessment does not replace that evaluation, and the importance of remaining in the ED until their evaluation is complete.     [name], PA-C 01/27/22 1115

## 2022-01-27 NOTE — H&P (Signed)
Mark Decker BUL:[id number] DOB: 12-16-1995 DOA: 01/27/2022     PCP: [name], MD   Outpatient Specialists:    Fabienne Bruns  [name] ) [name], MD (Inactive) [name], MD    Patient arrived to ER on 01/27/22 at 1059 Referred by Attending Toy Baker, MD   Patient coming from:    home Lives With family    Chief Complaint:   Chief Complaint  Patient presents with   Abdominal Pain   GI Bleeding   Nausea    HPI: Mark Decker is a 26 y.o. male with medical history significant of Crohn's disease, hx of stage 1 lymphoma    Presented with bright red blood per rectum No new subjective & objective note has been filed under this hospital service since the last note was generated.  Worsening bright red blood per rectum Patient has been followed by [name] for Crohn's disease he is prednisone has been increased gradually he has not taken 40 mg p.o. daily but still continues to have significant pain with bowel movements and now started to have blood at first culture in the stool but now continued to have bloody bowel movements initially slightly tachycardic in 200s after IV fluids improved reports rectal discomfort with each bowel movement he still able to pass gas last BM was in the emergency department  Occasional etoh no tobacco  Lab Results  Component Value Date   [place] 05/31/2021   [place] NEGATIVE 04/11/2021   [place] NEGATIVE 03/18/2020   [place] NEGATIVE 03/07/2020     Regarding pertinent Chronic problems:   Crohn's on mesalamine  and prednisone'    Chronic anemia - baseline hg Hemoglobin & Hematocrit  Recent Labs    08/10/21 1336 11/24/21 1039 01/27/22 1123  HGB 13.6 12.7* 11.1*  On iron      While in ER:   CT abd Question of mild proctitis. Otherwise no acute abnormality in the abdomen or pelvis.    Large stool in the colon could suggest constipation.    Following Medications were  ordered in ER: Medications  0.9 %  sodium chloride infusion ( Intravenous New Bag/Given 01/27/22 1839)  HYDROmorphone (DILAUDID) injection 1 mg (has no administration in time range)  sodium chloride 0.9 % bolus 1,000 mL (0 mLs Intravenous Stopped 01/27/22 1810)  ondansetron (ZOFRAN) injection 4 mg (4 mg Intravenous Given 01/27/22 1644)  HYDROmorphone (DILAUDID) injection 1 mg (1 mg Intravenous Given 01/27/22 1645)  iohexol (OMNIPAQUE) 300 MG/ML solution 100 mL (100 mLs Intravenous Contrast Given 01/27/22 1741)    _______________________________________________________ ER Provider Called:  [facility]   Dr. Alessandra Bevels They Recommend admit to medicine   Will see in AM  ED Triage Vitals  Enc Vitals Group     BP 01/27/22 1104 136/72     Pulse Rate 01/27/22 1104 (!) 115     Resp 01/27/22 1104 16     Temp 01/27/22 1104 98.1 F (36.7 C)     Temp Source 01/27/22 1104 Oral     SpO2 01/27/22 1104 100 %     Weight 01/27/22 1109 235 lb (106.6 kg)     Height 01/27/22 1109 6' 5"  (1.956 m)     Head Circumference --      Peak Flow --      Pain Score 01/27/22 1108 6     Pain Loc --      Pain Edu? --      Excl. in [place]? --   HOZY(24)@  _________________________________________ Significant initial  Findings: Abnormal Labs Reviewed  CBC WITH DIFFERENTIAL/PLATELET - Abnormal; Notable for the following components:      Result Value   WBC 12.8 (*)    RBC 4.03 (*)    Hemoglobin 11.1 (*)    HCT 34.0 (*)    Platelets 402 (*)    Neutro Abs 8.3 (*)    Monocytes Absolute 2.4 (*)    All other components within normal limits  COMPREHENSIVE METABOLIC PANEL - Abnormal; Notable for the following components:   Potassium 3.2 (*)    Glucose, Bld 102 (*)    Calcium 8.6 (*)    Albumin 3.1 (*)    AST 13 (*)    All other components within normal limits  URINALYSIS, ROUTINE W REFLEX MICROSCOPIC - Abnormal; Notable for the following components:   Hgb urine dipstick SMALL (*)    Bacteria, UA RARE (*)    All other  components within normal limits     _________________________     The recent clinical data is shown below. Vitals:   01/27/22 1700 01/27/22 1730 01/27/22 1750 01/27/22 1841  BP: 122/73 122/81  126/79  Pulse: 83 82  82  Resp: 16 16  16   Temp:   97.8 F (36.6 C)   TempSrc:   Oral   SpO2: 99% 99%  100%  Weight:      Height:          WBC     Component Value Date/Time   WBC 12.8 (H) 01/27/2022 1123   LYMPHSABS 1.8 01/27/2022 1123   LYMPHSABS 1.1 01/25/2014 1412   MONOABS 2.4 (H) 01/27/2022 1123   MONOABS 1.0 (H) 01/25/2014 1412   EOSABS 0.1 01/27/2022 1123   EOSABS 0.2 01/25/2014 1412   BASOSABS 0.1 01/27/2022 1123   BASOSABS 0.1 01/25/2014 1412      _______________________________________________ Hospitalist was called for admission for   Gastrointestinal hemorrhage, unspecified gastrointestinal hemorrhage type     The following Work up has been ordered so far:  Orders Placed This Encounter  Procedures   CT Abdomen Pelvis W Contrast   CBC with Differential   Comprehensive metabolic panel   Lipase, blood   Urinalysis, Routine w reflex microscopic   Magnesium   Phosphorus   CK   Prealbumin   CBC   Cardiac monitoring   Consult to gastroenterology   Consult to hospitalist   POC occult blood, ED RN will collect   Type and screen [place] to Inpatient (patient's expected length of stay will be greater than 2 midnights or inpatient only procedure)     OTHER Significant initial  Findings:  labs showing:  Recent Labs  Lab 01/27/22 1123  NA 137  K 3.2*  CO2 25  GLUCOSE 102*  BUN 17  CREATININE 0.89  CALCIUM 8.6*    Cr   stable,   Lab Results  Component Value Date   CREATININE 0.89 01/27/2022   CREATININE 0.92 11/24/2021   CREATININE 0.81 08/10/2021    Recent Labs  Lab 01/27/22 1123  AST 13*  ALT 18  ALKPHOS 41  BILITOT 0.5  PROT 6.7  ALBUMIN 3.1*   Lab Results  Component Value Date   CALCIUM 8.6 (L)  01/27/2022    Plt: Lab Results  Component Value Date   PLT 402 (H) 01/27/2022    COVID-19 Labs  No results for input(s): "DDIMER", "FERRITIN", "LDH", "CRP" in the last 72 hours.  Lab Results  Component  Value Date   SARSCOV2NAA NEGATIVE 05/31/2021   SARSCOV2NAA NEGATIVE 04/11/2021   SARSCOV2NAA NEGATIVE 03/18/2020   SARSCOV2NAA NEGATIVE 03/07/2020     Recent Labs  Lab 01/27/22 1123  WBC 12.8*  NEUTROABS 8.3*  HGB 11.1*  HCT 34.0*  MCV 84.4  PLT 402*    HG/HCT  Down  from baseline see below    Component Value Date/Time   HGB 11.1 (L) 01/27/2022 1123   HGB 14.3 10/09/2020 1005   HGB 13.7 01/25/2014 1412   HCT 34.0 (L) 01/27/2022 1123   HCT 42.8 01/25/2014 1412   MCV 84.4 01/27/2022 1123   MCV 84.0 01/25/2014 1412    Recent Labs  Lab 01/27/22 1123  LIPASE 33     Cultures: No results found for: "SDES", "SPECREQUEST", "CULT", "REPTSTATUS"   Radiological Exams on Admission: CT Abdomen Pelvis W Contrast  Result Date: 01/27/2022 CLINICAL DATA:  Abdominal pain, history of Crohn's blood in stool EXAM: CT ABDOMEN AND PELVIS WITH CONTRAST TECHNIQUE: Multidetector CT imaging of the abdomen and pelvis was performed using the standard protocol following bolus administration of intravenous contrast. RADIATION DOSE REDUCTION: This exam was performed according to the departmental dose-optimization program which includes automated exposure control, adjustment of the mA and/or kV according to patient size and/or use of iterative reconstruction technique. CONTRAST:  179m OMNIPAQUE IOHEXOL 300 MG/ML  SOLN COMPARISON:  CT enterography 12/22/2021 FINDINGS: Lower chest: No acute abnormality. Hepatobiliary: No suspicious focal liver abnormality is seen. No gallstones, gallbladder wall thickening, or biliary dilatation. Pancreas: Unremarkable. No pancreatic ductal dilatation or surrounding inflammatory changes. Spleen: Normal in size without focal abnormality. Adrenals/Urinary Tract: Adrenal  glands are unremarkable. Kidneys are normal, without renal calculi, suspicious focal lesion, or hydronephrosis. Bladder is unremarkable. Stomach/Bowel: Stomach is within normal limits. The appendix is normal. The terminal ileum appears normal. Large amount of stool throughout the colon. Mild rectal and distal sigmoid colon wall thickening and adjacent stranding. Normal caliber large and small bowel. Vascular/Lymphatic: No significant vascular findings are present. No enlarged abdominal or pelvic lymph nodes. Reproductive: Unremarkable. Other: No free intraperitoneal fluid or air. Musculoskeletal: No acute or significant osseous findings. IMPRESSION: 1. Question of mild proctitis. Otherwise no acute abnormality in the abdomen or pelvis. 2.  Large stool in the colon could suggest constipation. Electronically Signed   By: TPlacido SouM.D.   On: 01/27/2022 17:54   _______________________________________________________________________________________________________ Latest   Blood pressure 126/79, pulse 82, temperature 97.8 F (36.6 C), temperature source Oral, resp. rate 16, height 6' 5"  (1.956 m), weight 106.6 kg, SpO2 100 %.   Vitals  labs and radiology finding personally reviewed  Review of Systems:    Pertinent positives include:  abdominal pain,  blood in stool,  Constitutional:  No weight loss, night sweats, Fevers, chills, fatigue, weight loss  HEENT:  No headaches, Difficulty swallowing,Tooth/dental problems,Sore throat,  No sneezing, itching, ear ache, nasal congestion, post nasal drip,  Cardio-vascular:  No chest pain, Orthopnea, PND, anasarca, dizziness, palpitations.no Bilateral lower extremity swelling  GI:  No heartburn, indigestion, nausea, vomiting, diarrhea, change in bowel habits, loss of appetite, melena, hematemesis Resp:  no shortness of breath at rest. No dyspnea on exertion, No excess mucus, no productive cough, No non-productive cough, No coughing up of blood.No change  in color of mucus.No wheezing. Skin:  no rash or lesions. No jaundice GU:  no dysuria, change in color of urine, no urgency or frequency. No straining to urinate.  No flank pain.  Musculoskeletal:  No joint pain  or no joint swelling. No decreased range of motion. No back pain.  Psych:  No change in mood or affect. No depression or anxiety. No memory loss.  Neuro: no localizing neurological complaints, no tingling, no weakness, no double vision, no gait abnormality, no slurred speech, no confusion  All systems reviewed and apart from [place] all are negative _______________________________________________________________________________________________ Past Medical History:   Past Medical History:  Diagnosis Date   Anemia 04/26/2013   Crohn disease ([place])    Gastroesophageal reflux disease    Iron deficiency anemia    Left varicocele    Lymphopenia 04/14/2012   HIV negative in 03/2012 per PCP.   Lymphopenia 04/27/2013   Weight loss       Past Surgical History:  Procedure Laterality Date   IR IMAGING GUIDED PORT INSERTION  03/20/2020   IR REMOVAL TUN ACCESS W/ PORT W/O FL MOD SED  11/03/2020   MASS BIOPSY Right 03/10/2020   Procedure: EXCISIONAL RIGHT NECK MASS BIOPSY;  Surgeon: Rozetta Nunnery, MD;  Location: [facility];  Service: ENT;  Laterality: Right;   VARICOCELE EXCISION  2010    Social History:  Ambulatory   independently       reports that he has never smoked. He has never used smokeless tobacco. He reports current alcohol use. He reports that he does not currently use drugs after having used the following drugs: Marijuana.    Family History:   Family History  Problem Relation Age of Onset   Arthritis Mother    Crohn's disease Mother    Hypertension Mother    IgA nephropathy Mother    Arthritis Father    Migraines Father    Eczema Father    Cancer Maternal Aunt 37       colon   Cancer Paternal Grandfather        colon   Migraines Sister     ______________________________________________________________________________________________ Allergies: Allergies  Allergen Reactions   Amoxicillin Anaphylaxis     Prior to Admission medications   Medication Sig Start Date End Date Taking? Authorizing Provider  acetaminophen (TYLENOL) 500 MG tablet Take 1,000 mg by mouth every 6 (six) hours as needed for moderate pain or headache.   Yes [provider]  ascorbic acid (VITAMIN C) 500 MG tablet Take 500 mg by mouth daily.   Yes [provider]  Cholecalciferol (VITAMIN D) 50 MCG (2000 UT) tablet Take 6,000 Units by mouth daily.   Yes [provider]  ferrous sulfate 325 (65 FE) MG EC tablet Take 325 mg by mouth in the morning and at bedtime.   Yes [provider]  Magnesium 250 MG TABS Take 250 mg by mouth daily.   Yes [provider]  Mesalamine (ASACOL) 400 MG CPDR DR capsule Take 1 capsule (400 mg total) by mouth 2 (two) times daily. Patient taking differently: Take 800 mg by mouth 3 (three) times daily. 04/12/21  Yes Kathie Dike, MD  polyethylene glycol (MIRALAX / GLYCOLAX) 17 g packet Take 17 g by mouth daily as needed for mild constipation.   Yes [provider]  predniSONE (DELTASONE) 10 MG tablet Take 40 mg by mouth daily. 01/02/22  Yes [provider]  PRESCRIPTION MEDICATION Place 1 Application rectally 3 (three) times daily. Medication: Diltiazem 2% CR/LIDOC 2% oint.  For anal fissure pain   Yes [provider]  SUPER B COMPLEX/C PO Take 1 tablet by mouth daily.   Yes [provider]  budesonide (ENTOCORT EC)  3 MG 24 hr capsule Take 3 capsules (9 mg total) by mouth daily. Patient not taking: Reported on 01/27/2022 11/26/21   [name], MD  ondansetron (ZOFRAN) 4 MG tablet Take 1 tablet (4 mg total) by mouth every 6 (six) hours. Patient not taking: Reported on 01/27/2022 05/31/21   Domenic Moras, PA-C  prochlorperazine (COMPAZINE) 10 MG tablet Take 1  tablet (10 mg total) by mouth every 6 (six) hours as needed (Nausea or vomiting). 03/14/20 10/10/20  [name], MD    ___________________________________________________________________________________________________ Physical Exam:    01/27/2022    6:41 PM 01/27/2022    5:30 PM 01/27/2022    5:00 PM  Vitals with BMI  Systolic [id number]  Diastolic 79 81 73  Pulse 82 82 83     1. General:  in No  Acute distress   Chronically ill  -appearing 2. Psychological: Alert and  Oriented 3. Head/ENT:  Dry Mucous Membranes                          Head Non traumatic, neck supple                          Normal  Dentition 4. SKIN:  decreased Skin turgor,  Skin clean Dry and intact no rash 5. Heart: Regular rate and rhythm no  Murmur, no Rub or gallop 6. Lungs:  Clear to auscultation bilaterally, no wheezes or crackles   7. Abdomen: Soft, lower abd tender, Non distended bowel sounds present 8. Lower extremities: no clubbing, cyanosis, no  edema 9. Neurologically Grossly intact, moving all 4 extremities equally   10. MSK: Normal range of motion    Chart has been reviewed  ______________________________________________________________________________________________  Assessment/Plan  26 y.o. male with medical history significant of Crohn's disease, hx of stage 1 lymphoma    Admitted for   Gastrointestinal hemorrhage, unspecified gastrointestinal hemorrhage type      Present on Admission:  Lower GI bleed  Crohn disease ([place])  Hypokalemia  Hodgkin lymphoma ([place])  Iron deficiency anemia     Crohn disease ([facility]) Appreciate [facility] consult Cont mesalamine and change prednisone to solumedrol 40 mg IV in AM defer to gi if need to increase the dose  Lower GI bleed - Suspect Lower Gi source - Admit  For further management given:    comorbid illness rebleeding    most likely related to IBD        -  ER  Provider spoke to gastroenterology  [facility] ) they will see patient in a.m. appreciate  their consult   - serial CBC.    - Monitor for any recurrence,  evidence of hemodynamic instability or significant blood loss -  type and screen,  - Transfuse as needed for hemoglobin below 7 or <9 if evidence of significant  bleeding  - Establish at least 2 PIV and fluid resuscitate   - clear liquids for tonight keep nothing by mouth post midnight,     Hypokalemia Replace and recheck in AM  mag WNL  Hodgkin lymphoma ([place]) Sp chemo followed by [name]  Now in remission  Iron deficiency anemia Obtain anemia panel serial CBC    Other plan as per orders.  DVT prophylaxis:  SCD       Code Status:    Code Status: Prior FULL CODE  as per patient   I had personally discussed CODE STATUS with patient  Family Communication:   Family  at  Bedside  plan of care was discussed   with Wife,   Disposition Plan:    To home once workup is complete and patient is stable   Following barriers for discharge:                            Electrolytes corrected                               Anemia stable                              Pain controlled with PO medications                                                      Will need consultants to evaluate patient prior to discharge                       Consults called:    [facility]  Admission status:  ED Disposition     ED Disposition  Admit   Condition  --   [place]: [facility] [[id number]]  Level of Care: Telemetry [5]  Admit to tele based on following criteria: Other see comments  Comments: hypokalemia  May admit patient to [facility] or [facility] if equivalent level of care is available:: No  Covid Evaluation: Asymptomatic - no recent exposure (last 10 days) testing not required  Diagnosis: Lower GI bleed [[id number]]  Admitting Physician: Toy Baker [[id number]]  Attending Physician: Toy Baker [[id number]]  Certification:: I certify this patient will need inpatient services for at  least 2 midnights  Estimated Length of Stay: 2              inpatient     I Expect 2 midnight stay secondary to severity of patient's current illness need for inpatient interventions justified by the following:  hemodynamic instability despite optimal treatment (tachycardia  )   and extensive comorbidities including:IBD, malignancy  That are currently affecting medical management.   I expect  patient to be hospitalized for 2 midnights requiring inpatient medical care.  Patient is at high risk for adverse outcome (such as loss of life or disability) if not treated.  Indication for inpatient stay as follows:    Need for operative/procedural  intervention     Need for  IV fluids, IV PPI IV steroids    Level of care     tele  For 12H    Natsha Guidry 01/27/2022, 8:36 PM    [facility]     after 2 AM please page floor coverage PA If 7AM-7PM, please contact the day team taking care of the patient using [email or web address]   Patient was evaluated in the context of the global COVID-19 pandemic, which necessitated consideration that the patient might be at risk for infection with the SARS-CoV-2 virus that causes COVID-19. Institutional protocols and algorithms that pertain to the evaluation of patients at risk for COVID-19 are in a state of rapid change based on information released by regulatory bodies including the CDC and federal and state organizations. These policies and algorithms  were followed during the patient's care.

## 2022-01-27 NOTE — Subjective & Objective (Signed)
Hx of Crohn's dieses since 15 Have had prior bleeding in October but not as significant as it is now Feeling warm but not fever  Reports a lot of fatigue For 1 wk have had mucus and blood  Reports significant urgency No CP no SOB

## 2022-01-27 NOTE — Plan of Care (Signed)
Discussed with patient plan of care for the evening, pain management and admission question with some teach back displayed.  Explained medications and not getting up to use the bathroom without assistance since patient is still having bloody stools.

## 2022-01-27 NOTE — Assessment & Plan Note (Signed)
Obtain anemia panel serial CBC

## 2022-01-27 NOTE — Assessment & Plan Note (Signed)
Replace and recheck in AM  mag WNL

## 2022-01-27 NOTE — ED Provider Notes (Signed)
Mark Decker Provider Note   CSN: [id number] Arrival date & time: 01/27/22  1059     History  Chief Complaint  Patient presents with   Abdominal Pain   GI Bleeding   Nausea    Mark Decker is a 26 y.o. male.  Patient followed by [name] known to have a history of Crohn's disease.  For the past 2 weeks been having some discomfort and they have been increasing his steroid medication he went up to prednisone 40 mg a day this weekend.  Been having pain with bowel movements and then for the past 2 days has had some black stool and then with periphery of blood in the toilet water.  Had a bowel movement here that was predominantly all red blood.  Patient arrived with heart rate in the 100s but blood pressures have been good.  States that he has been having some rectal discomfort with each of the bowel movements.  Last seen by GI medicine on July 31.  Past medical history significant for the history of Crohn's disease.  Patient is never been a smoker.  Patient has not had any abdominal surgery.       Home Medications Prior to Admission medications   Medication Sig Start Date End Date Taking? Authorizing Provider  acetaminophen (TYLENOL) 500 MG tablet Take 1,000 mg by mouth every 6 (six) hours as needed for moderate pain or headache.   Yes [provider]  ascorbic acid (VITAMIN C) 500 MG tablet Take 500 mg by mouth daily.   Yes [provider]  Cholecalciferol (VITAMIN D) 50 MCG (2000 UT) tablet Take 6,000 Units by mouth daily.   Yes [provider]  ferrous sulfate 325 (65 FE) MG EC tablet Take 325 mg by mouth in the morning and at bedtime.   Yes [provider]  Magnesium 250 MG TABS Take 250 mg by mouth daily.   Yes [provider]  Mesalamine (ASACOL) 400 MG CPDR DR capsule Take 1 capsule (400 mg total) by mouth 2 (two) times daily. Patient taking differently: Take 800 mg by mouth 3 (three)  times daily. 04/12/21  Yes Kathie Dike, MD  polyethylene glycol (MIRALAX / GLYCOLAX) 17 g packet Take 17 g by mouth daily as needed for mild constipation.   Yes [provider]  predniSONE (DELTASONE) 10 MG tablet Take 40 mg by mouth daily. 01/02/22  Yes [provider]  PRESCRIPTION MEDICATION Place 1 Application rectally 3 (three) times daily. Medication: Diltiazem 2% CR/LIDOC 2% oint.  For anal fissure pain   Yes [provider]  SUPER B COMPLEX/C PO Take 1 tablet by mouth daily.   Yes [provider]  budesonide (ENTOCORT EC) 3 MG 24 hr capsule Take 3 capsules (9 mg total) by mouth daily. Patient not taking: Reported on 01/27/2022 11/26/21   [name], MD  ondansetron (ZOFRAN) 4 MG tablet Take 1 tablet (4 mg total) by mouth every 6 (six) hours. Patient not taking: Reported on 01/27/2022 05/31/21   Domenic Moras, PA-C  prochlorperazine (COMPAZINE) 10 MG tablet Take 1 tablet (10 mg total) by mouth every 6 (six) hours as needed (Nausea or vomiting). 03/14/20 10/10/20  [name], MD      Allergies    Amoxicillin    Review of Systems   Review of Systems  Constitutional:  Negative for chills and fever.  HENT:  Negative for ear pain and sore throat.   Eyes:  Negative for pain and  visual disturbance.  Respiratory:  Negative for cough and shortness of breath.   Cardiovascular:  Negative for chest pain and palpitations.  Gastrointestinal:  Positive for abdominal pain and blood in stool. Negative for nausea and vomiting.  Genitourinary:  Negative for dysuria and hematuria.  Musculoskeletal:  Negative for arthralgias and back pain.  Skin:  Negative for color change and rash.  Neurological:  Negative for seizures and syncope.  All other systems reviewed and are negative.   Physical Exam Updated Vital Signs BP 126/79   Pulse 82   Temp 97.8 F (36.6 C) (Oral)   Resp 16   Ht 1.956 m (6' 5" )   Wt 106.6 kg   SpO2 100%   BMI 27.87 kg/m  Physical Exam Vitals  and nursing note reviewed.  Constitutional:      General: He is not in acute distress.    Appearance: He is well-developed.  HENT:     Head: Normocephalic and atraumatic.  Eyes:     General: No scleral icterus.    Extraocular Movements: Extraocular movements intact.     Conjunctiva/sclera: Conjunctivae normal.     Pupils: Pupils are equal, round, and reactive to light.  Cardiovascular:     Rate and Rhythm: Normal rate and regular rhythm.     Heart sounds: No murmur heard. Pulmonary:     Effort: Pulmonary effort is normal. No respiratory distress.     Breath sounds: Normal breath sounds.  Abdominal:     General: Abdomen is flat.     Palpations: Abdomen is soft.     Tenderness: There is no abdominal tenderness.     Hernia: No hernia is present.  Genitourinary:    Rectum: Guaiac result positive.  Musculoskeletal:        General: No swelling.     Cervical back: Neck supple.  Skin:    General: Skin is warm and dry.     Capillary Refill: Capillary refill takes less than 2 seconds.  Neurological:     General: No focal deficit present.     Mental Status: He is alert and oriented to person, place, and time.  Psychiatric:        Mood and Affect: Mood normal.     ED Results / Procedures / Treatments   Labs (all labs ordered are listed, but only abnormal results are displayed) Labs Reviewed  CBC WITH DIFFERENTIAL/PLATELET - Abnormal; Notable for the following components:      Result Value   WBC 12.8 (*)    RBC 4.03 (*)    Hemoglobin 11.1 (*)    HCT 34.0 (*)    Platelets 402 (*)    Neutro Abs 8.3 (*)    Monocytes Absolute 2.4 (*)    All other components within normal limits  COMPREHENSIVE METABOLIC PANEL - Abnormal; Notable for the following components:   Potassium 3.2 (*)    Glucose, Bld 102 (*)    Calcium 8.6 (*)    Albumin 3.1 (*)    AST 13 (*)    All other components within normal limits  URINALYSIS, ROUTINE W REFLEX MICROSCOPIC - Abnormal; Notable for the following  components:   Hgb urine dipstick SMALL (*)    Bacteria, UA RARE (*)    All other components within normal limits  LIPASE, BLOOD  POC OCCULT BLOOD, ED  TYPE AND SCREEN    EKG None  Radiology CT Abdomen Pelvis W Contrast  Result Date: 01/27/2022 CLINICAL DATA:  Abdominal pain, history of Crohn's blood  in stool EXAM: CT ABDOMEN AND PELVIS WITH CONTRAST TECHNIQUE: Multidetector CT imaging of the abdomen and pelvis was performed using the standard protocol following bolus administration of intravenous contrast. RADIATION DOSE REDUCTION: This exam was performed according to the departmental dose-optimization program which includes automated exposure control, adjustment of the mA and/or kV according to patient size and/or use of iterative reconstruction technique. CONTRAST:  178m OMNIPAQUE IOHEXOL 300 MG/ML  SOLN COMPARISON:  CT enterography 12/22/2021 FINDINGS: Lower chest: No acute abnormality. Hepatobiliary: No suspicious focal liver abnormality is seen. No gallstones, gallbladder wall thickening, or biliary dilatation. Pancreas: Unremarkable. No pancreatic ductal dilatation or surrounding inflammatory changes. Spleen: Normal in size without focal abnormality. Adrenals/Urinary Tract: Adrenal glands are unremarkable. Kidneys are normal, without renal calculi, suspicious focal lesion, or hydronephrosis. Bladder is unremarkable. Stomach/Bowel: Stomach is within normal limits. The appendix is normal. The terminal ileum appears normal. Large amount of stool throughout the colon. Mild rectal and distal sigmoid colon wall thickening and adjacent stranding. Normal caliber large and small bowel. Vascular/Lymphatic: No significant vascular findings are present. No enlarged abdominal or pelvic lymph nodes. Reproductive: Unremarkable. Other: No free intraperitoneal fluid or air. Musculoskeletal: No acute or significant osseous findings. IMPRESSION: 1. Question of mild proctitis. Otherwise no acute abnormality in the  abdomen or pelvis. 2.  Large stool in the colon could suggest constipation. Electronically Signed   By: TPlacido SouM.D.   On: 01/27/2022 17:54    Procedures Procedures    Medications Ordered in ED Medications  0.9 %  sodium chloride infusion ( Intravenous New Bag/Given 01/27/22 1839)  sodium chloride 0.9 % bolus 1,000 mL (0 mLs Intravenous Stopped 01/27/22 1810)  ondansetron (ZOFRAN) injection 4 mg (4 mg Intravenous Given 01/27/22 1644)  HYDROmorphone (DILAUDID) injection 1 mg (1 mg Intravenous Given 01/27/22 1645)  iohexol (OMNIPAQUE) 300 MG/ML solution 100 mL (100 mLs Intravenous Contrast Given 01/27/22 1741)    ED Course/ Medical Decision Making/ A&P                           Medical Decision Making Amount and/or Complexity of Data Reviewed Labs: ordered. Radiology: ordered.  Risk Prescription drug management. Decision regarding hospitalization.   Patient with bowel movement here was grossly bloody.  Patient was originally tachycardic after liter of fluid no longer tachycardic.  Blood pressures have all been above 90.  Currently around 1115systolic.  Patient typed and screened.  CBC had a white count of 12.8 to the elevated white count probably something to do with the increase in steroids.  Hemoglobin is 11.1.  Hemoglobin was in the 12 range the end of May.  Complete metabolic panel some mild hypokalemia liver function test normal and renal function GFR greater than 60.  Lipase normal.  Urinalysis negative.  CT scan of the abdomen showed question of some mild proctitis otherwise normal large stool in the colon's could suggest constipation.  Discussed with on-call [facility], Dr. BAlessandra Bevels who agrees with admission and observation.  Patient currently hemodynamically stable.  But is still having active bleeding.  Will discuss with the hospitalist for admission.   Final Clinical Impression(s) / ED Diagnoses Final diagnoses:  Gastrointestinal hemorrhage, unspecified gastrointestinal  hemorrhage type    Rx / DC Orders ED Discharge Orders     None         ZFredia Sorrow MD 01/27/22 1845

## 2022-01-28 DIAGNOSIS — K922 Gastrointestinal hemorrhage, unspecified: Secondary | ICD-10-CM | POA: Diagnosis not present

## 2022-01-28 LAB — COMPREHENSIVE METABOLIC PANEL
ALT: 16 U/L (ref 0–44)
AST: 11 U/L — ABNORMAL LOW (ref 15–41)
Albumin: 2.9 g/dL — ABNORMAL LOW (ref 3.5–5.0)
Alkaline Phosphatase: 38 U/L (ref 38–126)
Anion gap: 5 (ref 5–15)
BUN: 11 mg/dL (ref 6–20)
CO2: 26 mmol/L (ref 22–32)
Calcium: 8.7 mg/dL — ABNORMAL LOW (ref 8.9–10.3)
Chloride: 109 mmol/L (ref 98–111)
Creatinine, Ser: 0.82 mg/dL (ref 0.61–1.24)
GFR, Estimated: >60 mL/min (ref >60)
Glucose, Bld: 108 mg/dL — ABNORMAL HIGH (ref 70–99)
Potassium: 4.3 mmol/L (ref 3.5–5.1)
Sodium: 140 mmol/L (ref 135–145)
Total Bilirubin: 0.6 mg/dL (ref 0.3–1.2)
Total Protein: 6.4 g/dL — ABNORMAL LOW (ref 6.5–8.1)

## 2022-01-28 LAB — RETICULOCYTES
Immature Retic Fract: 20.8 % — ABNORMAL HIGH (ref 2.3–15.9)
RBC.: 3.89 MIL/uL — ABNORMAL LOW (ref 4.22–5.81)
Retic Count, Absolute: 68.9 10*3/uL (ref 19.0–186.0)
Retic Ct Pct: 1.8 % (ref 0.4–3.1)

## 2022-01-28 LAB — CBC
HCT: 32.8 % — ABNORMAL LOW (ref 39.0–52.0)
HCT: 34.1 % — ABNORMAL LOW (ref 39.0–52.0)
HCT: 34.7 % — ABNORMAL LOW (ref 39.0–52.0)
Hemoglobin: 10.5 g/dL — ABNORMAL LOW (ref 13.0–17.0)
Hemoglobin: 10.8 g/dL — ABNORMAL LOW (ref 13.0–17.0)
Hemoglobin: 10.7 g/dL — ABNORMAL LOW (ref 13.0–17.0)
MCH: 27.2 pg (ref 26.0–34.0)
MCH: 27.1 pg (ref 26.0–34.0)
MCH: 26.4 pg (ref 26.0–34.0)
MCHC: 32 g/dL (ref 30.0–36.0)
MCHC: 31.7 g/dL (ref 30.0–36.0)
MCHC: 30.8 g/dL (ref 30.0–36.0)
MCV: 85 fL (ref 80.0–100.0)
MCV: 85.5 fL (ref 80.0–100.0)
MCV: 85.7 fL (ref 80.0–100.0)
Platelets: 380 10*3/uL (ref 150–400)
Platelets: 398 10*3/uL (ref 150–400)
Platelets: 402 10*3/uL — ABNORMAL HIGH (ref 150–400)
RBC: 3.86 MIL/uL — ABNORMAL LOW (ref 4.22–5.81)
RBC: 3.99 MIL/uL — ABNORMAL LOW (ref 4.22–5.81)
RBC: 4.05 MIL/uL — ABNORMAL LOW (ref 4.22–5.81)
RDW: 14.1 % (ref 11.5–15.5)
RDW: 14 % (ref 11.5–15.5)
RDW: 13.8 % (ref 11.5–15.5)
WBC: 9.7 10*3/uL (ref 4.0–10.5)
WBC: 9.8 10*3/uL (ref 4.0–10.5)
WBC: 10.9 10*3/uL — ABNORMAL HIGH (ref 4.0–10.5)
nRBC: 0 % (ref 0.0–0.2)
nRBC: 0 % (ref 0.0–0.2)
nRBC: 0 % (ref 0.0–0.2)

## 2022-01-28 LAB — IRON AND TIBC
Iron: 26 ug/dL — ABNORMAL LOW (ref 45–182)
Saturation Ratios: 11 % — ABNORMAL LOW (ref 17.9–39.5)
TIBC: 230 ug/dL — ABNORMAL LOW (ref 250–450)
UIBC: 204 ug/dL

## 2022-01-28 LAB — FOLATE: Folate: 7.9 ng/mL (ref >5.9)

## 2022-01-28 LAB — FERRITIN: Ferritin: 33 ng/mL (ref 24–336)

## 2022-01-28 LAB — PREALBUMIN: Prealbumin: 13 mg/dL — ABNORMAL LOW (ref 18–38)

## 2022-01-28 LAB — VITAMIN B12: Vitamin B-12: 575 pg/mL (ref 180–914)

## 2022-01-28 MED ORDER — METOPROLOL TARTRATE 5 MG/5ML IV SOLN: 5.0000 mg | INTRAVENOUS | Status: DC | PRN

## 2022-01-28 MED ORDER — ORAL CARE MOUTH RINSE: 15.0000 mL | OROMUCOSAL | Status: DC | PRN

## 2022-01-28 MED ORDER — IPRATROPIUM-ALBUTEROL 0.5-2.5 (3) MG/3ML IN SOLN: 3.0000 mL | RESPIRATORY_TRACT | Status: DC | PRN

## 2022-01-28 MED ORDER — HYDRALAZINE HCL 20 MG/ML IJ SOLN: 10.0000 mg | INTRAMUSCULAR | Status: DC | PRN

## 2022-01-28 MED ORDER — SENNOSIDES-DOCUSATE SODIUM 8.6-50 MG PO TABS: 1.0000 | ORAL_TABLET | Freq: Every evening | ORAL | Status: DC | PRN

## 2022-01-28 MED ORDER — ADULT MULTIVITAMIN W/MINERALS CH
1.0000 | ORAL_TABLET | Freq: Every day | ORAL | Status: DC
  Administered 2022-01-28 – 2022-01-29 (×2): 1 via ORAL
  Filled 2022-01-28 (×2): qty 1

## 2022-01-28 MED ORDER — FERROUS SULFATE 325 (65 FE) MG PO TABS
325.0000 mg | ORAL_TABLET | Freq: Two times a day (BID) | ORAL | Status: DC
  Administered 2022-01-29: 325 mg via ORAL
  Filled 2022-01-28: qty 1

## 2022-01-28 MED ORDER — SODIUM CHLORIDE 0.9 % IV SOLN
250.0000 mg | Freq: Every day | INTRAVENOUS | Status: AC
  Administered 2022-01-28 – 2022-01-29 (×2): 250 mg via INTRAVENOUS
  Filled 2022-01-28 (×2): qty 20

## 2022-01-28 MED ORDER — BOOST / RESOURCE BREEZE PO LIQD CUSTOM
1.0000 | Freq: Three times a day (TID) | ORAL | Status: DC
  Administered 2022-01-28 (×2): 1 via ORAL

## 2022-01-28 MED ORDER — OXYCODONE HCL 5 MG PO TABS
5.0000 mg | ORAL_TABLET | ORAL | Status: DC | PRN
  Administered 2022-01-28 – 2022-01-29 (×2): 5 mg via ORAL
  Filled 2022-01-28 (×2): qty 1

## 2022-01-28 MED ORDER — TRAZODONE HCL 50 MG PO TABS: 50.0000 mg | ORAL_TABLET | Freq: Every evening | ORAL | Status: DC | PRN

## 2022-01-28 NOTE — TOC Progression Note (Signed)
Transition of Care [facility]) - Progression Note    Patient Details  Name: Mark Decker MRN: [id number] Date of Birth: 1996-04-23  Transition of Care [facility]) CM/SW Contact  Servando Snare, Pena Phone Number: 01/28/2022, 10:35 AM  Clinical Narrative:     Transition of Care (TOC) Screening Note   Patient Details  Name: Mark Decker Date of Birth: 02/16/1996   Transition of Care [facility]) CM/SW Contact:    Servando Snare, LCSW Phone Number: 01/28/2022, 10:35 AM    Transition of Care Department [facility]) has reviewed patient and no TOC needs have been identified at this time. We will continue to monitor patient advancement through interdisciplinary progression rounds. If new patient transition needs arise, please place a TOC consult.          Expected Discharge Plan and Services                                                 Social Determinants of Health (SDOH) Interventions    Readmission Risk Interventions     No data to display

## 2022-01-28 NOTE — Progress Notes (Signed)
PROGRESS NOTE    Mark Decker  XOV:[id number] DOB: 02-19-1996 DOA: 01/27/2022 PCP: [name], MD   Brief Narrative:  26 year old male with history of Crohn's disease, stage I lymphoma comes to the hospital with bright red blood per rectum.  Patient is followed by [name] and on prednisone.  Upon admission he was dehydrated, tachycardic.  CT abdomen pelvis showed mild proctitis with large stool in the colon/constipation.  [facility] was consulted.   Assessment & Plan:  Principal Problem:   Lower GI bleed Active Problems:   Iron deficiency anemia   Crohn disease (HCC)   Hodgkin lymphoma (HCC)   Hypokalemia    Bright red blood per rectum Acute proctitis with large stool/constipation History of Crohn's disease -Continue patient on Solu-Medrol, GI consulted.  Continue mesalamine.  Need for antibiotics per GI.  If continues to have diarrhea he will require C. difficile/GI panel.  Mild proctitis infectious versus inflammatory versus reactive changes from constipation.  Currently patient is n.p.o., IV fluids.  Diet can be advanced per GI if no further procedures are planned. -Bowel regimen  Hypokalemia - Replete as needed  Hodgkin's lymphoma - Status postchemotherapy in remission.  Followed by [name]  Iron deficiency anemia -Low ferritin and iron saturation.  We will start him on IV iron while he is in the hospital thereafter transition to p.o.  Folate and B12 overall unremarkable.     DVT prophylaxis: SCDs Code Status: Full code Family Communication:    Status is: Inpatient Remains inpatient appropriate because: Ongoing management for possible infectious versus inflammatory colitis.  Discharge once cleared by GI.     Subjective: Seen and examined at bedside, tells me his abdominal symptoms have improved in last 24 hours.  Denies any nausea, vomiting.    Examination:  General exam: Appears calm and comfortable  Respiratory system: Clear to  auscultation. Respiratory effort normal. Cardiovascular system: S1 & S2 heard, RRR. No JVD, murmurs, rubs, gallops or clicks. No pedal edema. Gastrointestinal system: Slightly tender to deep palpation in the lower abdomen otherwise abdominal exam is unremarkable. Central nervous system: Alert and oriented. No focal neurological deficits. Extremities: Symmetric 5 x 5 power. Skin: No rashes, lesions or ulcers Psychiatry: Judgement and insight appear normal. Mood & affect appropriate.     Objective: Vitals:   01/28/22 0340 01/28/22 0415 01/28/22 0421 01/28/22 0515  BP: 115/61   (!) 112/57  Pulse: (!) 58   62  Resp: 18 11 16 16   Temp: 98 F (36.7 C)   97.7 F (36.5 C)  TempSrc: Oral   Oral  SpO2: 98%   98%  Weight:      Height:        Intake/Output Summary (Last 24 hours) at 01/28/2022 0807 Last data filed at 01/28/2022 0630 Gross per 24 hour  Intake 2799.56 ml  Output --  Net 2799.56 ml   Filed Weights   01/27/22 1109  Weight: 106.6 kg     Data Reviewed:   CBC: Recent Labs  Lab 01/27/22 1123 01/27/22 2147  WBC 12.8* 10.6*  NEUTROABS 8.3*  --   HGB 11.1* 10.0*  HCT 34.0* 31.7*  MCV 84.4 85.0  PLT 402* 004   Basic Metabolic Panel: Recent Labs  Lab 01/27/22 1123  NA 137  K 3.2*  CL 104  CO2 25  GLUCOSE 102*  BUN 17  CREATININE 0.89  CALCIUM 8.6*  MG 2.0  PHOS 2.9   GFR: Estimated Creatinine Clearance: 157.1 mL/min (by C-G formula based  on SCr of 0.89 mg/dL). Liver Function Tests: Recent Labs  Lab 01/27/22 1123  AST 13*  ALT 18  ALKPHOS 41  BILITOT 0.5  PROT 6.7  ALBUMIN 3.1*   Recent Labs  Lab 01/27/22 1123  LIPASE 33   No results for input(s): "AMMONIA" in the last 168 hours. Coagulation Profile: No results for input(s): "INR", "PROTIME" in the last 168 hours. Cardiac Enzymes: Recent Labs  Lab 01/27/22 1123  CKTOTAL 39*   BNP (last 3 results) No results for input(s): "PROBNP" in the last 8760 hours. HbA1C: No results for  input(s): "HGBA1C" in the last 72 hours. CBG: No results for input(s): "GLUCAP" in the last 168 hours. Lipid Profile: No results for input(s): "CHOL", "HDL", "LDLCALC", "TRIG", "CHOLHDL", "LDLDIRECT" in the last 72 hours. Thyroid Function Tests: No results for input(s): "TSH", "T4TOTAL", "FREET4", "T3FREE", "THYROIDAB" in the last 72 hours. Anemia Panel: No results for input(s): "VITAMINB12", "FOLATE", "FERRITIN", "TIBC", "IRON", "RETICCTPCT" in the last 72 hours. Sepsis Labs: No results for input(s): "PROCALCITON", "LATICACIDVEN" in the last 168 hours.  No results found for this or any previous visit (from the past 240 hour(s)).       Radiology Studies: CT Abdomen Pelvis W Contrast  Result Date: 01/27/2022 CLINICAL DATA:  Abdominal pain, history of Crohn's blood in stool EXAM: CT ABDOMEN AND PELVIS WITH CONTRAST TECHNIQUE: Multidetector CT imaging of the abdomen and pelvis was performed using the standard protocol following bolus administration of intravenous contrast. RADIATION DOSE REDUCTION: This exam was performed according to the departmental dose-optimization program which includes automated exposure control, adjustment of the mA and/or kV according to patient size and/or use of iterative reconstruction technique. CONTRAST:  176m OMNIPAQUE IOHEXOL 300 MG/ML  SOLN COMPARISON:  CT enterography 12/22/2021 FINDINGS: Lower chest: No acute abnormality. Hepatobiliary: No suspicious focal liver abnormality is seen. No gallstones, gallbladder wall thickening, or biliary dilatation. Pancreas: Unremarkable. No pancreatic ductal dilatation or surrounding inflammatory changes. Spleen: Normal in size without focal abnormality. Adrenals/Urinary Tract: Adrenal glands are unremarkable. Kidneys are normal, without renal calculi, suspicious focal lesion, or hydronephrosis. Bladder is unremarkable. Stomach/Bowel: Stomach is within normal limits. The appendix is normal. The terminal ileum appears normal.  Large amount of stool throughout the colon. Mild rectal and distal sigmoid colon wall thickening and adjacent stranding. Normal caliber large and small bowel. Vascular/Lymphatic: No significant vascular findings are present. No enlarged abdominal or pelvic lymph nodes. Reproductive: Unremarkable. Other: No free intraperitoneal fluid or air. Musculoskeletal: No acute or significant osseous findings. IMPRESSION: 1. Question of mild proctitis. Otherwise no acute abnormality in the abdomen or pelvis. 2.  Large stool in the colon could suggest constipation. Electronically Signed   By: TPlacido SouM.D.   On: 01/27/2022 17:54        Scheduled Meds:  Mesalamine  800 mg Oral TID   methylPREDNISolone (SOLU-MEDROL) injection  40 mg Intravenous Daily   Continuous Infusions:  sodium chloride 125 mL/hr at 01/28/22 0630     LOS: 1 day   Time spent= 35 mins    Sydnei Ohaver CArsenio Loader MD [facility]  If 7PM-7AM, please contact night-coverage  01/28/2022, 8:07 AM

## 2022-01-28 NOTE — Consult Note (Signed)
Referring Provider:  [place] Primary Care Physician:  [name], MD Primary Gastroenterologist:  Dr. Michail Sermon  Reason for Consultation: Rectal bleeding  HPI: Mark Decker is a 26 y.o. male with past medical history of Crohn's ileitis previously on Humira and Delzicol.  He was diagnosed with Hodgkin's lymphoma in September 2021 requiring chemotherapy.  Humira was subsequently discontinued.  He was subsequently kept on low-dose prednisone along with Delzicol.  Patient was seen in the office recently for abdominal distention.  We have been trying to titrate his prednisone up and down to manage his symptoms as an outpatient.  Was also started on Cortenema which was subsequently discontinued.  He presented to the hospital with worsening abdominal pain and rectal bleeding.  Blood work showed mild drop in hemoglobin.  CT scan showed proctitis.   Colonoscopy in July 2022 showed normal terminal ileum, normal mucosa from cecum to descending colon mild inflammation in the sigmoid colon.  Biopsies from terminal ileum showed normal tissue.  Biopsies from right colon showed no evidence of colitis but biopsies from left colon and sigmoid colon showed mild active colitis.  Past Medical History:  Diagnosis Date   Anemia 04/26/2013   Crohn disease ([place])    Gastroesophageal reflux disease    Iron deficiency anemia    Left varicocele    Lymphopenia 04/14/2012   HIV negative in 03/2012 per PCP.   Lymphopenia 04/27/2013   Weight loss     Past Surgical History:  Procedure Laterality Date   IR IMAGING GUIDED PORT INSERTION  03/20/2020   IR REMOVAL TUN ACCESS W/ PORT W/O FL MOD SED  11/03/2020   MASS BIOPSY Right 03/10/2020   Procedure: EXCISIONAL RIGHT NECK MASS BIOPSY;  Surgeon: Rozetta Nunnery, MD;  Location: [facility];  Service: ENT;  Laterality: Right;   VARICOCELE EXCISION  2010    Prior to Admission medications   Medication Sig Start Date End Date Taking?  Authorizing Provider  acetaminophen (TYLENOL) 500 MG tablet Take 1,000 mg by mouth every 6 (six) hours as needed for moderate pain or headache.   Yes [provider]  Carboxymethylcellul-Glycerin (LUBRICATING EYE DROPS OP) Place 1 drop into both eyes daily as needed (dry eyes).   Yes [provider]  Cholecalciferol (VITAMIN D) 50 MCG (2000 UT) tablet Take 6,000 Units by mouth daily.   Yes [provider]  EPINEPHrine 0.3 mg/0.3 mL IJ SOAJ injection Inject 0.3 mg into the muscle as needed for anaphylaxis. 01/11/20  Yes [provider]  hyoscyamine (ANASPAZ) 0.125 MG TBDP disintergrating tablet Take 0.125 mg by mouth 4 (four) times daily as needed. cramps 12/12/20  Yes [provider]  Magnesium 250 MG TABS Take 250 mg by mouth daily.   Yes [provider]  Multiple Vitamins-Minerals (ZINC PO) Take 2 tablets by mouth daily. With quercetin   Yes [provider]  OVER THE COUNTER MEDICATION Take 2 capsules by mouth at bedtime as needed (sleep). Stress Relief otc supplement   Yes [provider]  oxymetazoline (AFRIN) 0.05 % nasal spray Place 1 spray into both nostrils 2 (two) times daily as needed for congestion.   Yes [provider]  polyethylene glycol (MIRALAX / GLYCOLAX) 17 g packet Take 17 g by mouth daily as needed for mild constipation.   Yes [provider]  SUPER B COMPLEX/C PO Take 1 tablet by mouth daily.   Yes [provider]  TURMERIC PO Take 2 capsules by mouth daily.   Yes  [provider]  prochlorperazine (COMPAZINE) 10 MG tablet Take 1 tablet (10 mg total) by mouth every 6 (six) hours as needed (Nausea or vomiting). 03/14/20 10/10/20  [name], MD    Scheduled Meds:  Derrill Memo ON 01/30/2022] ferrous sulfate  325 mg Oral BID WC   Mesalamine  800 mg Oral TID   methylPREDNISolone (SOLU-MEDROL) injection  40 mg Intravenous Daily   Continuous Infusions:  sodium chloride 125 mL/hr at  01/28/22 0630   ferric gluconate (FERRLECIT) IVPB     PRN Meds:.acetaminophen **OR** acetaminophen, hydrALAZINE, ipratropium-albuterol, metoprolol tartrate, morphine injection, mouth rinse, oxyCODONE, senna-docusate, traZODone  Allergies as of 01/27/2022 - Review Complete 01/27/2022  Allergen Reaction Noted   Amoxicillin Anaphylaxis 12/14/2016    Family History  Problem Relation Age of Onset   Arthritis Mother    Crohn's disease Mother    Hypertension Mother    IgA nephropathy Mother    Arthritis Father    Migraines Father    Eczema Father    Cancer Maternal Aunt 41       colon   Cancer Paternal Grandfather        colon   Migraines Sister     Social History   Socioeconomic History   Marital status: Single    Spouse name: Not on file   Number of children: 0   Years of education: Not on file   Highest education level: Not on file  Occupational History    Comment: [employer]; Psych  Tobacco Use   Smoking status: Never   Smokeless tobacco: Never  Vaping Use   Vaping Use: Former   Substances: Mixture of cannabinoids  Substance and Sexual Activity   Alcohol use: Yes    Comment: occ. - "wine once or twice a month"   Drug use: Not Currently    Types: Marijuana   Sexual activity: Not on file  Other Topics Concern   Not on file  Social History Narrative   ** Merged History Encounter **       Social Determinants of Health   Financial Resource Strain: Not on file  Food Insecurity: Not on file  Transportation Needs: Not on file  Physical Activity: Not on file  Stress: Not on file  Social Connections: Not on file  Intimate Partner Violence: Not on file    Review of Systems: 12 point review of system is done which is negative except as mentioned in HPI  Physical Exam: Vital signs: Vitals:   01/28/22 0515 01/28/22 1034  BP: (!) 112/57 112/65  Pulse: 62 81  Resp: 16 16  Temp: 97.7 F (36.5 C) 97.8 F (36.6 C)  SpO2: 98% 98%   Last BM Date :  01/27/22 Physical Exam Vitals reviewed.  Constitutional:      General: He is not in acute distress.    Appearance: Normal appearance.  HENT:     Head: Normocephalic and atraumatic.     Nose: Nose normal.  Eyes:     General: No scleral icterus.    Extraocular Movements: Extraocular movements intact.  Cardiovascular:     Rate and Rhythm: Normal rate.     Heart sounds: Normal heart sounds. No murmur heard. Pulmonary:     Effort: Pulmonary effort is normal. No respiratory distress.  Abdominal:     General: Bowel sounds are normal. There is no distension.     Palpations: Abdomen is soft.     Tenderness: There is no abdominal tenderness. There is no guarding.  Musculoskeletal:  Cervical back: Normal range of motion and neck supple.     Right lower leg: No edema.     Left lower leg: No edema.  Skin:    General: Skin is warm.     Coloration: Skin is not jaundiced.  Neurological:     Mental Status: He is alert and oriented to person, place, and time.  Psychiatric:        Mood and Affect: Mood normal.        Thought Content: Thought content normal.        Judgment: Judgment normal.      GI:  Lab Results: Recent Labs    01/27/22 1123 01/27/22 2147 01/28/22 0744  WBC 12.8* 10.6* 9.7  HGB 11.1* 10.0* 10.5*  HCT 34.0* 31.7* 32.8*  PLT 402* 383 380    BMET Recent Labs    01/27/22 1123 01/28/22 0744  NA 137 140  K 3.2* 4.3  CL 104 109  CO2 25 26  GLUCOSE 102* 108*  BUN 17 11  CREATININE 0.89 0.82  CALCIUM 8.6* 8.7*    LFT Recent Labs    01/28/22 0744  PROT 6.4*  ALBUMIN 2.9*  AST 11*  ALT 16  ALKPHOS 38  BILITOT 0.6    PT/INR No results for input(s): "LABPROT", "INR" in the last 72 hours.   Studies/Results: CT Abdomen Pelvis W Contrast  Result Date: 01/27/2022 CLINICAL DATA:  Abdominal pain, history of Crohn's blood in stool EXAM: CT ABDOMEN AND PELVIS WITH CONTRAST TECHNIQUE: Multidetector CT imaging of the abdomen and pelvis was performed using  the standard protocol following bolus administration of intravenous contrast. RADIATION DOSE REDUCTION: This exam was performed according to the departmental dose-optimization program which includes automated exposure control, adjustment of the mA and/or kV according to patient size and/or use of iterative reconstruction technique. CONTRAST:  138m OMNIPAQUE IOHEXOL 300 MG/ML  SOLN COMPARISON:  CT enterography 12/22/2021 FINDINGS: Lower chest: No acute abnormality. Hepatobiliary: No suspicious focal liver abnormality is seen. No gallstones, gallbladder wall thickening, or biliary dilatation. Pancreas: Unremarkable. No pancreatic ductal dilatation or surrounding inflammatory changes. Spleen: Normal in size without focal abnormality. Adrenals/Urinary Tract: Adrenal glands are unremarkable. Kidneys are normal, without renal calculi, suspicious focal lesion, or hydronephrosis. Bladder is unremarkable. Stomach/Bowel: Stomach is within normal limits. The appendix is normal. The terminal ileum appears normal. Large amount of stool throughout the colon. Mild rectal and distal sigmoid colon wall thickening and adjacent stranding. Normal caliber large and small bowel. Vascular/Lymphatic: No significant vascular findings are present. No enlarged abdominal or pelvic lymph nodes. Reproductive: Unremarkable. Other: No free intraperitoneal fluid or air. Musculoskeletal: No acute or significant osseous findings. IMPRESSION: 1. Question of mild proctitis. Otherwise no acute abnormality in the abdomen or pelvis. 2.  Large stool in the colon could suggest constipation. Electronically Signed   By: TPlacido SouM.D.   On: 01/27/2022 17:54    Impression/Plan: -Rectal bleeding with right lower quadrant abdominal pain in a patient with known history of Crohn's ileocolitis.  Patient just had a bowel movement and abdominal pain resolved.  He denies any bleeding episode today. 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  Recent PET scan showed no activity.  Looks like its okay to use biological agents if needed from oncological standpoint  Recommendations ----------------------- -Patient abdominal pain resolved with a bowel movement. -No need for repeat CT if as patient is having solid stool.  His C. difficile was negative outpatient. -Advance diet  to soft. -Continue prednisone -Discharge home tomorrow -He has follow-up in office scheduled to discuss need for biological agent -Discussed with RN.  Discussed with hospitalist.    LOS: 1 day   Otis Brace  MD, FACP 01/28/2022, 12:22 PM  Contact #  [phone]

## 2022-01-28 NOTE — Plan of Care (Signed)
  Problem: Education: Goal: Knowledge of General Education information will improve Description: Including pain rating scale, medication(s)/side effects and non-pharmacologic comfort measures Outcome: Progressing   Problem: Health Behavior/Discharge Planning: Goal: Ability to manage health-related needs will improve Outcome: Progressing   Problem: Clinical Measurements: Goal: Ability to maintain clinical measurements within normal limits will improve Outcome: Progressing Goal: Will remain free from infection Outcome: Progressing   Problem: Clinical Measurements: Goal: Will remain free from infection Outcome: Progressing

## 2022-01-28 NOTE — Progress Notes (Signed)
Initial Nutrition Assessment  INTERVENTION:   -Boost Breeze po TID, each supplement provides 250 kcal and 9 grams of protein   -Multivitamin with minerals daily  NUTRITION DIAGNOSIS:   Increased nutrient needs related to chronic illness as evidenced by estimated needs.  GOAL:   Patient will meet greater than or equal to 90% of their needs  MONITOR:   PO intake, Supplement acceptance, Weight trends, Labs, I & O's  REASON FOR ASSESSMENT:   Malnutrition Screening Tool    ASSESSMENT:   26 year old male with history of Crohn's disease, stage I lymphoma comes to the hospital with bright red blood per rectum.  Patient is followed by [name] and on prednisone.  Upon admission he was dehydrated, tachycardic.  CT abdomen pelvis showed mild proctitis with large stool in the colon/constipation.  Patient not in room at time of visit.  Diet was just advanced to soft diet.  Per chart review, pt has been having Crohn's symptoms for the past month.  Will order Boost Breeze supplements for additional kcals and protein.  Per weight records, weight has been trending up.  Medications: Ferric gluconate  Labs reviewed: Low iron   NUTRITION - FOCUSED PHYSICAL EXAM:  Unable to complete at this time  Diet Order:   Diet Order             DIET SOFT Room service appropriate? Yes; Fluid consistency: Thin  Diet effective now                   EDUCATION NEEDS:   Not appropriate for education at this time  Skin:  Skin Assessment: Reviewed RN Assessment  Last BM:  8/3 -type 7  Height:   Ht Readings from Last 1 Encounters:  01/27/22 6' 5"  (1.956 m)    Weight:   Wt Readings from Last 1 Encounters:  01/27/22 106.6 kg   BMI:  Body mass index is 27.87 kg/m.  Estimated Nutritional Needs:   Kcal:  2300-2500  Protein:  115-125g  Fluid:  1.5L/day   Mark Bibles, MS, RD, LDN Inpatient Clinical Dietitian Contact information available via Amion

## 2022-01-29 LAB — BASIC METABOLIC PANEL
Anion gap: 6 (ref 5–15)
BUN: 12 mg/dL (ref 6–20)
CO2: 26 mmol/L (ref 22–32)
Calcium: 8 mg/dL — ABNORMAL LOW (ref 8.9–10.3)
Chloride: 108 mmol/L (ref 98–111)
Creatinine, Ser: 1.04 mg/dL (ref 0.61–1.24)
GFR, Estimated: >60 mL/min (ref >60)
Glucose, Bld: 94 mg/dL (ref 70–99)
Potassium: 3.6 mmol/L (ref 3.5–5.1)
Sodium: 140 mmol/L (ref 135–145)

## 2022-01-29 LAB — CBC
HCT: 31.2 % — ABNORMAL LOW (ref 39.0–52.0)
Hemoglobin: 9.7 g/dL — ABNORMAL LOW (ref 13.0–17.0)
MCH: 26.9 pg (ref 26.0–34.0)
MCHC: 31.1 g/dL (ref 30.0–36.0)
MCV: 86.4 fL (ref 80.0–100.0)
Platelets: 346 10*3/uL (ref 150–400)
RBC: 3.61 MIL/uL — ABNORMAL LOW (ref 4.22–5.81)
RDW: 13.9 % (ref 11.5–15.5)
WBC: 8.3 10*3/uL (ref 4.0–10.5)
nRBC: 0 % (ref 0.0–0.2)

## 2022-01-29 LAB — PHOSPHORUS: Phosphorus: 4.4 mg/dL (ref 2.5–4.6)

## 2022-01-29 LAB — MAGNESIUM: Magnesium: 2 mg/dL (ref 1.7–2.4)

## 2022-01-29 MED ORDER — ENSURE MAX PROTEIN PO LIQD
11.0000 [oz_av] | Freq: Two times a day (BID) | ORAL | Status: DC
  Filled 2022-01-29: qty 330

## 2022-01-29 MED ORDER — PREDNISONE 10 MG PO TABS: ORAL_TABLET | ORAL | 0 refills | Status: AC

## 2022-01-29 MED ORDER — SENNOSIDES-DOCUSATE SODIUM 8.6-50 MG PO TABS: 1.0000 | ORAL_TABLET | Freq: Every evening | ORAL | 0 refills | Status: DC | PRN

## 2022-01-29 NOTE — Discharge Summary (Signed)
Physician Discharge Summary  Mark Decker RSW:[id number] DOB: [birth year] DOA: 01/27/2022  PCP: Mark Decker  Admit date: 01/27/2022 Discharge date: 01/29/2022  Admitted From: Home Disposition: Home  Recommendations for Outpatient Follow-up:  Follow up with PCP in 1-2 weeks Please obtain BMP/CBC in one week your next doctors visit.  Follow-up outpatient [facility] gastroenterology Prednisone 40 mg daily, taper by 10 mg every week.   Discharge Condition: Stable CODE STATUS: Full code Diet recommendation: GI soft  Brief/Interim Summary: 26 year old male with history of Crohn's disease, stage I lymphoma comes to the hospital with bright red blood per rectum.  [facility] is followed [facility] and on prednisone.  Upon admission he was dehydrated, tachycardic.  CT abdomen pelvis showed mild proctitis with large stool in the colon/constipation.  [facility] cleared the patient to discharge home the following day as the symptoms significantly improved on prednisone taper course as mentioned above with outpatient follow-up.     Assessment & Plan:  Principal Problem:   Lower GI bleed Active Problems:   Iron deficiency anemia   Crohn disease (H[facility])   Hodgkin lymphoma ([facility])   Hypokalemia     Bright red blood per rectum Acute proctitis with large stool/constipation History of Crohn's disease - Initially patient started on Solu-Medrol, today tolerating oral.  Bleeding has pretty much subsided.  [facility] recommending discharging patient on prednisone taper as mentioned above with outpatient follow-up as recommended by their service.   -Bowel regimen   Hypokalemia - Replete as needed   Hodgkin's lymphoma - Status postchemotherapy in remission.  Followed by Mark Decker   Iron deficiency anemia -Low ferritin and iron saturation.  Received IV iron.  Follow-up outpatient GI     Assessment and Plan: No notes have been filed under this hospital service. Service: Hospitalist       Body mass index is 27.87 kg/m.       Discharge Diagnoses:  Principal Problem:   Lower GI bleed Active Problems:   Iron deficiency anemia   Crohn disease ([place])   Hodgkin lymphoma ([place])   Hypokalemia      Consultations: [facility]  Subjective: Feels well no complaints.  Wants to go home.  Discharge Exam: Vitals:   01/28/22 2217 01/29/22 0537  BP: 123/63 (!) 115/54  Pulse: (!) 103 78  Resp: 18 18  Temp: 98.2 F (36.8 C) 98.2 F (36.8 C)  SpO2: 99% 99%   Vitals:   01/28/22 1034 01/28/22 1335 01/28/22 2217 01/29/22 0537  BP: 112/65 117/64 123/63 (!) 115/54  Pulse: 81 71 (!) 103 78  Resp: 16 16 18 18   Temp: 97.8 F (36.6 C) 98.1 F (36.7 C) 98.2 F (36.8 C) 98.2 F (36.8 C)  TempSrc: Oral Oral Oral Oral  SpO2: 98% 100% 99% 99%  Weight:      Height:        General: Pt is alert, awake, not in acute distress Cardiovascular: RRR, S1/S2 +, no rubs, no gallops Respiratory: CTA bilaterally, no wheezing, no rhonchi Abdominal: Soft, NT, ND, bowel sounds + Extremities: no edema, no cyanosis  Discharge Instructions   Allergies as of 01/29/2022       Reactions   Amoxicillin Anaphylaxis        Medication List     STOP taking these medications    ondansetron 4 MG tablet Commonly known as: ZOFRAN       TAKE these medications    acetaminophen 500 MG tablet Commonly known as: TYLENOL Take 1,000 mg by  mouth every 6 (six) hours as needed for moderate pain or headache.   ascorbic acid 500 MG tablet Commonly known as: VITAMIN C Take 500 mg by mouth daily.   budesonide 3 MG 24 hr capsule Commonly known as: ENTOCORT EC Take 3 capsules (9 mg total) by mouth daily.   ferrous sulfate 325 (65 FE) MG EC tablet Take 325 mg by mouth in the morning and at bedtime.   Magnesium 250 MG Tabs Take 250 mg by mouth daily.   Mesalamine 400 MG Cpdr DR capsule Commonly known as: ASACOL Take 1 capsule (400 mg total) by mouth 2 (two) times  daily. What changed:  how much to take when to take this   polyethylene glycol 17 g packet Commonly known as: MIRALAX / GLYCOLAX Take 17 g by mouth daily as needed for mild constipation.   predniSONE 10 MG tablet Commonly known as: DELTASONE Take 4 tablets (40 mg total) by mouth daily with breakfast for 7 days, THEN 3 tablets (30 mg total) daily with breakfast for 7 days, THEN 2 tablets (20 mg total) daily with breakfast for 7 days, THEN 1 tablet (10 mg total) daily with breakfast for 7 days. Start taking on: January 29, 2022 What changed: See the new instructions.   PRESCRIPTION MEDICATION Place 1 Application rectally 3 (three) times daily. Medication: Diltiazem 2% CR/LIDOC 2% oint.  For anal fissure pain   senna-docusate 8.6-50 MG tablet Commonly known as: Senokot-S Take 1 tablet by mouth at bedtime as needed for moderate constipation.   SUPER B COMPLEX/C PO Take 1 tablet by mouth daily.   Vitamin D 50 MCG (2000 UT) tablet Take 6,000 Units by mouth daily.        Allergies  Allergen Reactions   Amoxicillin Anaphylaxis    You were cared for by a hospitalist during your hospital stay. If you have any questions about your discharge medications or the care you received while you were in the hospital after you are discharged, you can call the unit and asked to speak with the hospitalist on call if the hospitalist that took care of you is not available. Once you are discharged, your primary care physician will handle any further medical issues. Please note that no refills for any discharge medications will be authorized once you are discharged, as it is imperative that you return to your primary care physician (or establish a relationship with a primary care physician if you do not have one) for your aftercare needs so that they can reassess your need for medications and monitor your lab values.   Procedures/Studies: CT Abdomen Pelvis W Contrast  Result Date: 01/27/2022 CLINICAL  DATA:  Abdominal pain, history of Crohn's blood in stool EXAM: CT ABDOMEN AND PELVIS WITH CONTRAST TECHNIQUE: Multidetector CT imaging of the abdomen and pelvis was performed using the standard protocol following bolus administration of intravenous contrast. RADIATION DOSE REDUCTION: This exam was performed according to the departmental dose-optimization program which includes automated exposure control, adjustment of the mA and/or kV according to patient size and/or use of iterative reconstruction technique. CONTRAST:  155m OMNIPAQUE IOHEXOL 300 MG/ML  SOLN COMPARISON:  CT enterography 12/22/2021 FINDINGS: Lower chest: No acute abnormality. Hepatobiliary: No suspicious focal liver abnormality is seen. No gallstones, gallbladder wall thickening, or biliary dilatation. Pancreas: Unremarkable. No pancreatic ductal dilatation or surrounding inflammatory changes. Spleen: Normal in size without focal abnormality. Adrenals/Urinary Tract: Adrenal glands are unremarkable. Kidneys are normal, without renal calculi, suspicious focal lesion, or hydronephrosis. Bladder is  unremarkable. Stomach/Bowel: Stomach is within normal limits. The appendix is normal. The terminal ileum appears normal. Large amount of stool throughout the colon. Mild rectal and distal sigmoid colon wall thickening and adjacent stranding. Normal caliber large and small bowel. Vascular/Lymphatic: No significant vascular findings are present. No enlarged abdominal or pelvic lymph nodes. Reproductive: Unremarkable. Other: No free intraperitoneal fluid or air. Musculoskeletal: No acute or significant osseous findings. IMPRESSION: 1. Question of mild proctitis. Otherwise no acute abnormality in the abdomen or pelvis. 2.  Large stool in the colon could suggest constipation. Electronically Signed   By: Placido Sou M.D.   On: 01/27/2022 17:54   DG Abd 1 View  Result Date: 01/25/2022 CLINICAL DATA:  Chronic constipation EXAM: ABDOMEN - 1 VIEW COMPARISON:   CT 12/22/2021 FINDINGS: The bowel gas pattern is normal. No radio-opaque calculi or other significant radiographic abnormality are seen. Moderate to large stool burden. IMPRESSION: Negative.  Moderate to large stool burden. Electronically Signed   By: Donavan Foil M.D.   On: 01/25/2022 17:05     The results of significant diagnostics from this hospitalization (including imaging, microbiology, ancillary and laboratory) are listed below for reference.     Microbiology: No results found for this or any previous visit (from the past 240 hour(s)).   Labs: BNP (last 3 results) No results for input(s): "BNP" in the last 8760 hours. Basic Metabolic Panel: Recent Labs  Lab 01/27/22 1123 01/28/22 0744 01/29/22 0441  NA 137 140 140  K 3.2* 4.3 3.6  CL 104 109 108  CO2 25 26 26   GLUCOSE 102* 108* 94  BUN 17 11 12   CREATININE 0.89 0.82 1.04  CALCIUM 8.6* 8.7* 8.0*  MG 2.0  --  2.0  PHOS 2.9  --  4.4   Liver Function Tests: Recent Labs  Lab 01/27/22 1123 01/28/22 0744  AST 13* 11*  ALT 18 16  ALKPHOS 41 38  BILITOT 0.5 0.6  PROT 6.7 6.4*  ALBUMIN 3.1* 2.9*   Recent Labs  Lab 01/27/22 1123  LIPASE 33   No results for input(s): "AMMONIA" in the last 168 hours. CBC: Recent Labs  Lab 01/27/22 1123 01/27/22 2147 01/28/22 0744 01/28/22 1305 01/28/22 1836 01/29/22 0441  WBC 12.8* 10.6* 9.7 9.8 10.9* 8.3  NEUTROABS 8.3*  --   --   --   --   --   HGB 11.1* 10.0* 10.5* 10.8* 10.7* 9.7*  HCT 34.0* 31.7* 32.8* 34.1* 34.7* 31.2*  MCV 84.4 85.0 85.0 85.5 85.7 86.4  PLT 402* 383 380 398 402* 346   Cardiac Enzymes: Recent Labs  Lab 01/27/22 1123  CKTOTAL 39*   BNP: Invalid input(s): "POCBNP" CBG: No results for input(s): "GLUCAP" in the last 168 hours. D-Dimer No results for input(s): "DDIMER" in the last 72 hours. Hgb A1c No results for input(s): "HGBA1C" in the last 72 hours. Lipid Profile No results for input(s): "CHOL", "HDL", "LDLCALC", "TRIG", "CHOLHDL",  "LDLDIRECT" in the last 72 hours. Thyroid function studies No results for input(s): "TSH", "T4TOTAL", "T3FREE", "THYROIDAB" in the last 72 hours.  Invalid input(s): "FREET3" Anemia work up Recent Labs    01/28/22 0744  VITAMINB12 575  FOLATE 7.9  FERRITIN 33  TIBC 230*  IRON 26*  RETICCTPCT 1.8   Urinalysis    Component Value Date/Time   COLORURINE YELLOW 01/27/2022 [place] 01/27/2022 1123   LABSPEC 1.026 01/27/2022 1123   PHURINE 5.0 01/27/2022 1123   GLUCOSEU NEGATIVE 01/27/2022 1123   HGBUR  SMALL (A) 01/27/2022 1123   BILIRUBINUR NEGATIVE 01/27/2022 1123   KETONESUR NEGATIVE 01/27/2022 1123   PROTEINUR NEGATIVE 01/27/2022 1123   NITRITE NEGATIVE 01/27/2022 1123   LEUKOCYTESUR NEGATIVE 01/27/2022 1123   Sepsis Labs Recent Labs  Lab 01/28/22 0744 01/28/22 1305 01/28/22 1836 01/29/22 0441  WBC 9.7 9.8 10.9* 8.3   Microbiology No results found for this or any previous visit (from the past 240 hour(s)).   Time coordinating discharge:  I have spent 35 minutes face to face with the patient and on the ward discussing the patients care, assessment, plan and disposition with other care givers. >50% of the time was devoted counseling the patient about the risks and benefits of treatment/Discharge disposition and coordinating care.   SIGNED:   Damita Lack, Decker  [facility] 01/29/2022, 2:25 PM   If 7PM-7AM, please contact night-coverage

## 2022-01-29 NOTE — Progress Notes (Signed)
[facility] Progress Note  Mark Decker 26 y.o. 07-16-1995  CC: Rectal bleeding, Crohn's disease   Subjective: Patient seen and examined at bedside.  Feeling better today.  No bleeding this morning.  Abdominal pain resolved.  Wants to go home.  ROS : Afebrile, negative for chest pain   Objective: Vital signs in last 24 hours: Vitals:   01/28/22 2217 01/29/22 0537  BP: 123/63 (!) 115/54  Pulse: (!) 103 78  Resp: 18 18  Temp: 98.2 F (36.8 C) 98.2 F (36.8 C)  SpO2: 99% 99%    Physical Exam:  General:  Alert, cooperative, no distress, appears stated age  Head:  Normocephalic, without obvious abnormality, atraumatic  Eyes:  , EOM's intact,   Lungs:   Clear to auscultation bilaterally, respirations unlabored  Heart:  Regular rate and rhythm, S1, S2 normal  Abdomen:   Soft, non-tender, bowel sounds active all four quadrants,  no masses,   Extremities: Extremities normal, atraumatic, no  edema  Pulses: 2+ and symmetric    Lab Results: Recent Labs    01/27/22 1123 01/28/22 0744 01/29/22 0441  NA 137 140 140  K 3.2* 4.3 3.6  CL 104 109 108  CO2 25 26 26   GLUCOSE 102* 108* 94  BUN 17 11 12   CREATININE 0.89 0.82 1.04  CALCIUM 8.6* 8.7* 8.0*  MG 2.0  --  2.0  PHOS 2.9  --  4.4   Recent Labs    01/27/22 1123 01/28/22 0744  AST 13* 11*  ALT 18 16  ALKPHOS 41 38  BILITOT 0.5 0.6  PROT 6.7 6.4*  ALBUMIN 3.1* 2.9*   Recent Labs    01/27/22 1123 01/27/22 2147 01/28/22 1836 01/29/22 0441  WBC 12.8*   < > 10.9* 8.3  NEUTROABS 8.3*  --   --   --   HGB 11.1*   < > 10.7* 9.7*  HCT 34.0*   < > 34.7* 31.2*  MCV 84.4   < > 85.7 86.4  PLT 402*   < > 402* 346   < > = values in this interval not displayed.   No results for input(s): "LABPROT", "INR" in the last 72 hours.    Assessment/Plan: -Rectal bleeding with right lower quadrant abdominal pain in a patient with known history of Crohn's ileocolitis.  Patient just had a bowel movement  and abdominal pain resolved.  He denies any bleeding episode today. 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  Recent PET scan showed no activity.  Looks like its okay to use biological agents if needed from oncological standpoint -Anemia.  Receiving IV iron   Recommendations ----------------------- -Okay to discharge home from GI standpoint on prednisone taper.  Recommend prednisone 40 mg daily for 1 week followed by prednisone 30 mg for another week, followed by prednisone 20 mg for another week and then prednisone 10 mg. -He has appointment with GI office in the next few days, probably on August 15. -I think he may benefit from referral to tertiary care center such as [facility] to discuss newer medications for his Crohn's disease. -GI will sign off.  Call us back if needed   Otis Brace MD, [place] 01/29/2022, 11:14 AM  Contact #  [phone]

## 2022-02-25 ENCOUNTER — Inpatient Hospital Stay (HOSPITAL_BASED_OUTPATIENT_CLINIC_OR_DEPARTMENT_OTHER): Payer: 59 | Admitting: Hematology and Oncology

## 2022-02-25 ENCOUNTER — Other Ambulatory Visit: Payer: Self-pay

## 2022-02-25 ENCOUNTER — Encounter: Payer: Self-pay | Admitting: Hematology and Oncology

## 2022-02-25 ENCOUNTER — Inpatient Hospital Stay: Payer: 59 | Attending: Hematology and Oncology

## 2022-02-25 DIAGNOSIS — R590 Localized enlarged lymph nodes: Secondary | ICD-10-CM

## 2022-02-25 DIAGNOSIS — C8190 Hodgkin lymphoma, unspecified, unspecified site: Secondary | ICD-10-CM | POA: Diagnosis present

## 2022-02-25 DIAGNOSIS — D539 Nutritional anemia, unspecified: Secondary | ICD-10-CM | POA: Insufficient documentation

## 2022-02-25 DIAGNOSIS — K5090 Crohn's disease, unspecified, without complications: Secondary | ICD-10-CM | POA: Diagnosis not present

## 2022-02-25 DIAGNOSIS — C8111 Nodular sclerosis classical Hodgkin lymphoma, lymph nodes of head, face, and neck: Secondary | ICD-10-CM

## 2022-02-25 DIAGNOSIS — R051 Acute cough: Secondary | ICD-10-CM

## 2022-02-25 LAB — COMPREHENSIVE METABOLIC PANEL
ALT: 17 U/L (ref 0–44)
AST: 13 U/L — ABNORMAL LOW (ref 15–41)
Albumin: 3.8 g/dL (ref 3.5–5.0)
Alkaline Phosphatase: 36 U/L — ABNORMAL LOW (ref 38–126)
Anion gap: 4 — ABNORMAL LOW (ref 5–15)
BUN: 20 mg/dL (ref 6–20)
CO2: 29 mmol/L (ref 22–32)
Calcium: 8.9 mg/dL (ref 8.9–10.3)
Chloride: 105 mmol/L (ref 98–111)
Creatinine, Ser: 1.18 mg/dL (ref 0.61–1.24)
GFR, Estimated: >60 mL/min (ref >60)
Glucose, Bld: 96 mg/dL (ref 70–99)
Potassium: 3.6 mmol/L (ref 3.5–5.1)
Sodium: 138 mmol/L (ref 135–145)
Total Bilirubin: 0.4 mg/dL (ref 0.3–1.2)
Total Protein: 6.7 g/dL (ref 6.5–8.1)

## 2022-02-25 LAB — CBC WITH DIFFERENTIAL/PLATELET
Abs Immature Granulocytes: 0.03 10*3/uL (ref 0.00–0.07)
Basophils Absolute: 0 10*3/uL (ref 0.0–0.1)
Basophils Relative: 0 %
Eosinophils Absolute: 0.1 10*3/uL (ref 0.0–0.5)
Eosinophils Relative: 1 %
HCT: 32.6 % — ABNORMAL LOW (ref 39.0–52.0)
Hemoglobin: 10.3 g/dL — ABNORMAL LOW (ref 13.0–17.0)
Immature Granulocytes: 0 %
Lymphocytes Relative: 19 %
Lymphs Abs: 1.7 10*3/uL (ref 0.7–4.0)
MCH: 27.6 pg (ref 26.0–34.0)
MCHC: 31.6 g/dL (ref 30.0–36.0)
MCV: 87.4 fL (ref 80.0–100.0)
Monocytes Absolute: 0.8 10*3/uL (ref 0.1–1.0)
Monocytes Relative: 9 %
Neutro Abs: 6.4 10*3/uL (ref 1.7–7.7)
Neutrophils Relative %: 71 %
Platelets: 302 10*3/uL (ref 150–400)
RBC: 3.73 MIL/uL — ABNORMAL LOW (ref 4.22–5.81)
RDW: 18.2 % — ABNORMAL HIGH (ref 11.5–15.5)
WBC: 9 10*3/uL (ref 4.0–10.5)
nRBC: 0 % (ref 0.0–0.2)

## 2022-02-25 LAB — IRON AND IRON BINDING CAPACITY (CC-WL,HP ONLY)
Iron: 29 ug/dL — ABNORMAL LOW (ref 45–182)
Saturation Ratios: 10 % — ABNORMAL LOW (ref 17.9–39.5)
TIBC: 288 ug/dL (ref 250–450)
UIBC: 259 ug/dL (ref 117–376)

## 2022-02-25 LAB — FERRITIN: Ferritin: 49 ng/mL (ref 24–336)

## 2022-02-25 LAB — SEDIMENTATION RATE: Sed Rate: 23 mm/hr — ABNORMAL HIGH (ref 0–16)

## 2022-02-25 NOTE — Progress Notes (Signed)
[facility] OFFICE PROGRESS NOTE  Patient Care Team: [name], MD as PCP - General (Family Medicine) [name], MD as Attending Physician (Family Medicine) [name], MD (Inactive) (Gastroenterology) [name], MD as Consulting Physician (Gastroenterology)  ASSESSMENT & PLAN:  Hodgkin lymphoma [facility]) His examination is benign His recent CT imaging of the abdomen and pelvis showed no evidence of disease His head and neck and axillary exams were normal He is on prednisone therapy for Crohn's relapse He does not need repeat imaging study now I will see him in 6 months for further follow-up  Deficiency anemia He has multifactorial anemia, likely due to Crohn's disease He will continue close monitoring and treatment as directed by his gastroenterologist His anemia is stable today  No orders of the defined types were placed in this encounter.   All questions were answered. The patient knows to call the clinic with any problems, questions or concerns. The total time spent in the appointment was 20 minutes encounter with patients including review of chart and various tests results, discussions about plan of care and coordination of care plan   [name], MD 02/25/2022 11:34 AM  INTERVAL HISTORY: Please see below for problem oriented charting. he returns for surveillance follow-up for treated history of Hodgkin lymphoma He is doing well recently since he was put back on prednisone therapy that seems to control his abdominal pain No new lymphadenopathy He is in the process of getting approved for Remicade He has recent flare of joint pain and is being referred to see rheumatologist  REVIEW OF SYSTEMS:   Constitutional: Denies fevers, chills or abnormal weight loss Eyes: Denies blurriness of vision Ears, nose, mouth, throat, and face: Denies mucositis or sore throat Respiratory: Denies cough, dyspnea or wheezes Cardiovascular: Denies palpitation,  chest discomfort or lower extremity swelling Gastrointestinal:  Denies nausea, heartburn or change in bowel habits Skin: Denies abnormal skin rashes Lymphatics: Denies new lymphadenopathy or easy bruising Neurological:Denies numbness, tingling or new weaknesses Behavioral/Psych: Mood is stable, no new changes  All other systems were reviewed with the patient and are negative.  I have reviewed the past medical history, past surgical history, social history and family history with the patient and they are unchanged from previous note.  ALLERGIES:  is allergic to amoxicillin.  MEDICATIONS:  Current Outpatient Medications  Medication Sig Dispense Refill   acetaminophen (TYLENOL) 500 MG tablet Take 1,000 mg by mouth every 6 (six) hours as needed for moderate pain or headache.     ascorbic acid (VITAMIN C) 500 MG tablet Take 500 mg by mouth daily.     budesonide (ENTOCORT EC) 3 MG 24 hr capsule Take 3 capsules (9 mg total) by mouth daily. (Patient not taking: Reported on 01/27/2022) 14 capsule 1   Cholecalciferol (VITAMIN D) 50 MCG (2000 UT) tablet Take 6,000 Units by mouth daily.     ferrous sulfate 325 (65 FE) MG EC tablet Take 325 mg by mouth in the morning and at bedtime.     Magnesium 250 MG TABS Take 250 mg by mouth daily.     Mesalamine (ASACOL) 400 MG CPDR DR capsule Take 1 capsule (400 mg total) by mouth 2 (two) times daily. (Patient taking differently: Take 800 mg by mouth 3 (three) times daily.) 180 capsule    polyethylene glycol (MIRALAX / GLYCOLAX) 17 g packet Take 17 g by mouth daily as needed for mild constipation.     predniSONE (DELTASONE) 10 MG tablet Take 4 tablets (40  mg total) by mouth daily with breakfast for 7 days, THEN 3 tablets (30 mg total) daily with breakfast for 7 days, THEN 2 tablets (20 mg total) daily with breakfast for 7 days, THEN 1 tablet (10 mg total) daily with breakfast for 7 days. 70 tablet 0   PRESCRIPTION MEDICATION Place 1 Application rectally 3 (three)  times daily. Medication: Diltiazem 2% CR/LIDOC 2% oint.  For anal fissure pain     senna-docusate (SENOKOT-S) 8.6-50 MG tablet Take 1 tablet by mouth at bedtime as needed for moderate constipation. 30 tablet 0   SUPER B COMPLEX/C PO Take 1 tablet by mouth daily.     No current facility-administered medications for this visit.    SUMMARY OF ONCOLOGIC HISTORY: Oncology History  Hodgkin lymphoma ([place])  03/10/2020 Pathology Results   A. LYMPH NODE, RIGHT SUBMANDIBULAR, BIOPSY:  -  Classic Hodgkin lymphoma  -  See comment   COMMENT:   The submitted lymph nodes are effaced by a vaguely nodular lymphoid proliferation separated by bands of fibrosis with admixed inflammatory cells including neutrophils and eosinophils. The lymphoid proliferation  is composed predominantly of small mature lymphocytes with scattered large atypical cells. The atypical cells have mono and multi-lobated nuclei and prominent nucleoli characteristic of Reed-Sternberg cells.  Lacunar cells are also present.   The neoplastic cells are CD30, CD15 (subset), Pax-5 (dim) and CD20 positive by immunohistochemistry. EBV by in-situ hybridization is positive in the Reed-Sternberg cells. CD3 highlights the background  T-cells.  EBV in-situ hybridization is negative.   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    03/14/2020 Initial Diagnosis   Hodgkin lymphoma ([place])   03/14/2020 Cancer Staging   Staging form: Hodgkin and Non-Hodgkin Lymphoma, AJCC 8th Edition - Clinical stage from 03/14/2020: Stage I (Hodgkin lymphoma, B - Symptoms) - Signed by [name], MD on 03/14/2020   03/18/2020 Echocardiogram   1. Left ventricular ejection fraction, by estimation, is 55 to 60%. The left ventricle has normal function. The left ventricle has no regional wall motion abnormalities. Left ventricular diastolic parameters were normal. The average  left ventricular global longitudinal strain is -15.9 %. The global longitudinal strain is normal.  2. Right ventricular systolic function is normal. The right ventricular size is normal. There is normal pulmonary artery systolic pressure.  3. The mitral valve is normal in structure. No evidence of mitral valve regurgitation. No evidence of mitral stenosis.  4. The aortic valve is normal in structure. Aortic valve regurgitation is not visualized. No aortic stenosis is present.  5. The inferior vena cava is normal in size with greater than 50% respiratory variability, suggesting right atrial pressure of 3 mmHg.   03/20/2020 Procedure   Placement of a subcutaneous port device. Catheter tip at the superior cavoatrial junction.   03/24/2020 - 09/08/2020 Chemotherapy   The patient had ABVD for chemotherapy treatment.     05/16/2020 PET scan   1. Small hypermetabolic RIGHT level II lymph node is indeterminate. Activity is similar to liver activity. No comparison available. 2. Reduction in size and no significant metabolic activity of RIGHT supraclavicular lymph node ( Deauville 1). 3. No evidence lymphoma chest, abdomen pelvis.  Normal spleen.  4. Intense metabolic activity localizing to LEFT paraspinal musculature in the cervical neck. Favor benign physiologic muscle musculature in the LEFT neck. Recommend clinical correlation for muscle spasm or trauma. If concern for unlikely muscular lymphoma, consider contrast CT or MRI of  the neck.   06/10/2020 Echocardiogram    1. Left ventricular ejection fraction, by estimation, is 55 to 60%. The left ventricle has normal function. The left ventricle has no regional wall motion abnormalities. Left ventricular diastolic parameters were normal. The average left ventricular global longitudinal strain is -21.6 %. The global longitudinal strain is normal.  2. Right ventricular systolic function is normal. The right ventricular size is normal. Tricuspid regurgitation  signal is inadequate for assessing PA pressure.  3. The mitral valve is normal in structure. Trivial mitral valve regurgitation. No evidence of mitral stenosis.  4. The aortic valve is tricuspid. Aortic valve regurgitation is not visualized. No aortic stenosis is present.     10/10/2020 PET scan   Negative PET-CT for metabolically active lymphoma.  (Deauville 1)     11/03/2020 Procedure   Successful removal of implanted Port-A-Cath.   08/24/2021 PET scan   1. Mildly enlarged and mildly hypermetabolic 1.0 cm right level 2 neck lymph node, mildly increased in metabolism and not substantially changed in size since 10/09/2020 PET-CT, equivocal for recurrent Deauville score 3 Hodgkin lymphoma. 2. No additional potential findings of metabolically active Hodgkin lymphoma. 3. New mild wall thickening with associated diffuse hypermetabolism throughout the sigmoid colon, suspicious for active inflammatory colitis in this patient with history of Crohn disease per prior imaging reports.       PHYSICAL EXAMINATION: ECOG PERFORMANCE STATUS: 1 - Symptomatic but completely ambulatory  Vitals:   02/25/22 1027  BP: 116/72  Pulse: 88  Resp: (!) 1  SpO2: 100%   Filed Weights   02/25/22 1027  Weight: 246 lb 6.4 oz (111.8 kg)    GENERAL:alert, no distress and comfortable SKIN: skin color, texture, turgor are normal, no rashes or significant lesions EYES: normal, Conjunctiva are pink and non-injected, sclera clear OROPHARYNX:no exudate, no erythema and lips, buccal mucosa, and tongue normal  NECK: supple, thyroid normal size, non-tender, without nodularity LYMPH:  no palpable lymphadenopathy in the cervical, axillary or inguinal LUNGS: clear to auscultation and percussion with normal breathing effort HEART: regular rate & rhythm and no murmurs and no lower extremity edema ABDOMEN:abdomen soft, non-tender and normal bowel sounds Musculoskeletal:no cyanosis of digits and no clubbing  NEURO: alert &  oriented x 3 with fluent speech, no focal motor/sensory deficits  LABORATORY DATA:  I have reviewed the data as listed    Component Value Date/Time   NA 138 02/25/2022 1005   NA 141 04/27/2013 1217   K 3.6 02/25/2022 1005   K 4.0 04/27/2013 1217   CL 105 02/25/2022 1005   CL 106 04/26/2012 1550   CO2 29 02/25/2022 1005   CO2 28 04/27/2013 1217   GLUCOSE 96 02/25/2022 1005   GLUCOSE 92 04/27/2013 1217   GLUCOSE 108 (H) 04/26/2012 1550   BUN 20 02/25/2022 1005   BUN 10.2 04/27/2013 1217   CREATININE 1.18 02/25/2022 1005   CREATININE 0.84 10/09/2020 1005   CREATININE 0.8 04/27/2013 1217   CALCIUM 8.9 02/25/2022 1005   CALCIUM 9.7 04/27/2013 1217   PROT 6.7 02/25/2022 1005   PROT 7.5 04/27/2013 1217   ALBUMIN 3.8 02/25/2022 1005   ALBUMIN 3.6 04/27/2013 1217   AST 13 (L) 02/25/2022 1005   AST 22 10/09/2020 1005   AST 14 04/27/2013 1217   ALT 17 02/25/2022 1005   ALT 20 10/09/2020 1005   ALT 23 04/27/2013 1217   ALKPHOS 36 (L) 02/25/2022 1005   ALKPHOS 64 04/27/2013 1217   BILITOT 0.4 02/25/2022 1005  BILITOT 0.7 10/09/2020 1005   BILITOT 0.86 04/27/2013 1217   GFRNONAA >60 02/25/2022 1005   GFRNONAA >60 10/09/2020 1005   GFRAA >60 03/24/2020 0853    No results found for: "SPEP", "UPEP"  Lab Results  Component Value Date   WBC 9.0 02/25/2022   NEUTROABS 6.4 02/25/2022   HGB 10.3 (L) 02/25/2022   HCT 32.6 (L) 02/25/2022   MCV 87.4 02/25/2022   PLT 302 02/25/2022      Chemistry      Component Value Date/Time   NA 138 02/25/2022 1005   NA 141 04/27/2013 1217   K 3.6 02/25/2022 1005   K 4.0 04/27/2013 1217   CL 105 02/25/2022 1005   CL 106 04/26/2012 1550   CO2 29 02/25/2022 1005   CO2 28 04/27/2013 1217   BUN 20 02/25/2022 1005   BUN 10.2 04/27/2013 1217   CREATININE 1.18 02/25/2022 1005   CREATININE 0.84 10/09/2020 1005   CREATININE 0.8 04/27/2013 1217      Component Value Date/Time   CALCIUM 8.9 02/25/2022 1005   CALCIUM 9.7 04/27/2013 1217    ALKPHOS 36 (L) 02/25/2022 1005   ALKPHOS 64 04/27/2013 1217   AST 13 (L) 02/25/2022 1005   AST 22 10/09/2020 1005   AST 14 04/27/2013 1217   ALT 17 02/25/2022 1005   ALT 20 10/09/2020 1005   ALT 23 04/27/2013 1217   BILITOT 0.4 02/25/2022 1005   BILITOT 0.7 10/09/2020 1005   BILITOT 0.86 04/27/2013 1217       RADIOGRAPHIC STUDIES: I have personally reviewed the radiological images as listed and agreed with the findings in the report. CT Abdomen Pelvis W Contrast  Result Date: 01/27/2022 CLINICAL DATA:  Abdominal pain, history of Crohn's blood in stool EXAM: CT ABDOMEN AND PELVIS WITH CONTRAST TECHNIQUE: Multidetector CT imaging of the abdomen and pelvis was performed using the standard protocol following bolus administration of intravenous contrast. RADIATION DOSE REDUCTION: This exam was performed according to the departmental dose-optimization program which includes automated exposure control, adjustment of the mA and/or kV according to patient size and/or use of iterative reconstruction technique. CONTRAST:  19m OMNIPAQUE IOHEXOL 300 MG/ML  SOLN COMPARISON:  CT enterography 12/22/2021 FINDINGS: Lower chest: No acute abnormality. Hepatobiliary: No suspicious focal liver abnormality is seen. No gallstones, gallbladder wall thickening, or biliary dilatation. Pancreas: Unremarkable. No pancreatic ductal dilatation or surrounding inflammatory changes. Spleen: Normal in size without focal abnormality. Adrenals/Urinary Tract: Adrenal glands are unremarkable. Kidneys are normal, without renal calculi, suspicious focal lesion, or hydronephrosis. Bladder is unremarkable. Stomach/Bowel: Stomach is within normal limits. The appendix is normal. The terminal ileum appears normal. Large amount of stool throughout the colon. Mild rectal and distal sigmoid colon wall thickening and adjacent stranding. Normal caliber large and small bowel. Vascular/Lymphatic: No significant vascular findings are present. No  enlarged abdominal or pelvic lymph nodes. Reproductive: Unremarkable. Other: No free intraperitoneal fluid or air. Musculoskeletal: No acute or significant osseous findings. IMPRESSION: 1. Question of mild proctitis. Otherwise no acute abnormality in the abdomen or pelvis. 2.  Large stool in the colon could suggest constipation. Electronically Signed   By: TPlacido SouM.D.   On: 01/27/2022 17:54

## 2022-02-25 NOTE — Assessment & Plan Note (Signed)
He has multifactorial anemia, likely due to Crohn's disease He will continue close monitoring and treatment as directed by his gastroenterologist His anemia is stable today

## 2022-02-25 NOTE — Assessment & Plan Note (Signed)
His examination is benign His recent CT imaging of the abdomen and pelvis showed no evidence of disease His head and neck and axillary exams were normal He is on prednisone therapy for Crohn's relapse He does not need repeat imaging study now I will see him in 6 months for further follow-up

## 2022-03-06 ENCOUNTER — Emergency Department (HOSPITAL_COMMUNITY)
Admission: EM | Admit: 2022-03-06 | Discharge: 2022-03-06 | Disposition: A | Discharge status: Home / Self Care | Payer: 59 | Attending: Emergency Medicine | Admitting: Emergency Medicine

## 2022-03-06 ENCOUNTER — Emergency Department (HOSPITAL_COMMUNITY): Payer: 59

## 2022-03-06 ENCOUNTER — Encounter (HOSPITAL_COMMUNITY): Payer: Self-pay | Admitting: Emergency Medicine

## 2022-03-06 ENCOUNTER — Other Ambulatory Visit: Payer: Self-pay

## 2022-03-06 DIAGNOSIS — L0231 Cutaneous abscess of buttock: Secondary | ICD-10-CM

## 2022-03-06 DIAGNOSIS — D649 Anemia, unspecified: Secondary | ICD-10-CM | POA: Insufficient documentation

## 2022-03-06 DIAGNOSIS — K610 Anal abscess: Secondary | ICD-10-CM | POA: Diagnosis present

## 2022-03-06 DIAGNOSIS — D72829 Elevated white blood cell count, unspecified: Secondary | ICD-10-CM | POA: Insufficient documentation

## 2022-03-06 DIAGNOSIS — K611 Rectal abscess: Secondary | ICD-10-CM | POA: Diagnosis present

## 2022-03-06 DIAGNOSIS — K5090 Crohn's disease, unspecified, without complications: Secondary | ICD-10-CM | POA: Diagnosis present

## 2022-03-06 DIAGNOSIS — K581 Irritable bowel syndrome with constipation: Secondary | ICD-10-CM | POA: Diagnosis present

## 2022-03-06 LAB — COMPREHENSIVE METABOLIC PANEL
ALT: 19 U/L (ref 0–44)
AST: 24 U/L (ref 15–41)
Albumin: 4 g/dL (ref 3.5–5.0)
Alkaline Phosphatase: 44 U/L (ref 38–126)
Anion gap: 6 (ref 5–15)
BUN: 23 mg/dL — ABNORMAL HIGH (ref 6–20)
CO2: 25 mmol/L (ref 22–32)
Calcium: 9.1 mg/dL (ref 8.9–10.3)
Chloride: 104 mmol/L (ref 98–111)
Creatinine, Ser: 1.01 mg/dL (ref 0.61–1.24)
GFR, Estimated: >60 mL/min (ref >60)
Glucose, Bld: 121 mg/dL — ABNORMAL HIGH (ref 70–99)
Potassium: 4.8 mmol/L (ref 3.5–5.1)
Sodium: 135 mmol/L (ref 135–145)
Total Bilirubin: 0.3 mg/dL (ref 0.3–1.2)
Total Protein: 7.9 g/dL (ref 6.5–8.1)

## 2022-03-06 LAB — CBC WITH DIFFERENTIAL/PLATELET
Abs Immature Granulocytes: 0.08 10*3/uL — ABNORMAL HIGH (ref 0.00–0.07)
Basophils Absolute: 0 10*3/uL (ref 0.0–0.1)
Basophils Relative: 0 %
Eosinophils Absolute: 0 10*3/uL (ref 0.0–0.5)
Eosinophils Relative: 0 %
HCT: 37.6 % — ABNORMAL LOW (ref 39.0–52.0)
Hemoglobin: 11.4 g/dL — ABNORMAL LOW (ref 13.0–17.0)
Immature Granulocytes: 1 %
Lymphocytes Relative: 5 %
Lymphs Abs: 0.8 10*3/uL (ref 0.7–4.0)
MCH: 27.1 pg (ref 26.0–34.0)
MCHC: 30.3 g/dL (ref 30.0–36.0)
MCV: 89.3 fL (ref 80.0–100.0)
Monocytes Absolute: 0.6 10*3/uL (ref 0.1–1.0)
Monocytes Relative: 4 %
Neutro Abs: 14.7 10*3/uL — ABNORMAL HIGH (ref 1.7–7.7)
Neutrophils Relative %: 90 %
Platelets: 413 10*3/uL — ABNORMAL HIGH (ref 150–400)
RBC: 4.21 MIL/uL — ABNORMAL LOW (ref 4.22–5.81)
RDW: 17.2 % — ABNORMAL HIGH (ref 11.5–15.5)
WBC: 16.3 10*3/uL — ABNORMAL HIGH (ref 4.0–10.5)
nRBC: 0 % (ref 0.0–0.2)

## 2022-03-06 MED ORDER — IOHEXOL 300 MG/ML  SOLN
100.0000 mL | Freq: Once | INTRAMUSCULAR | Status: AC | PRN
  Administered 2022-03-06: 100 mL via INTRAVENOUS

## 2022-03-06 MED ORDER — HYDROCODONE-ACETAMINOPHEN 5-325 MG PO TABS
1.0000 | ORAL_TABLET | Freq: Once | ORAL | Status: AC
  Administered 2022-03-06: 1 via ORAL
  Filled 2022-03-06: qty 1

## 2022-03-06 MED ORDER — LIDOCAINE-EPINEPHRINE (PF) 2 %-1:200000 IJ SOLN
20.0000 mL | Freq: Once | INTRAMUSCULAR | Status: DC
  Filled 2022-03-06: qty 20

## 2022-03-06 MED ORDER — DOXYCYCLINE HYCLATE 100 MG PO CAPS: 100.0000 mg | ORAL_CAPSULE | Freq: Two times a day (BID) | ORAL | 0 refills | Status: AC

## 2022-03-06 NOTE — ED Provider Notes (Signed)
[name] Provider Note   CSN: [id number] Arrival date & time: 03/06/22  9675     History  Chief Complaint  Patient presents with   Abscess    Mark Decker is a 26 y.o. male with history of Crohn's with associated colitis, hemorrhoids, and Hodgkin lymphoma.  Presenting today due to worsening abscess near his rectum.  First appeared about a month ago.  Tried a short course of antibiotics, still taking them, however appears to be worsening.  Denies fevers, abdominal pain, N/V/D, though does note increased difficulty passing stool.  Also without fevers, drainage or urinary symptoms.  No other complaints at this time.  The history is provided by the patient and medical records.     Home Medications Prior to Admission medications   Medication Sig Start Date End Date Taking? Authorizing Provider  doxycycline (VIBRAMYCIN) 100 MG capsule Take 1 capsule (100 mg total) by mouth 2 (two) times daily for 7 days. 03/06/22 03/13/22 Yes Prince Rome, PA-C  acetaminophen (TYLENOL) 500 MG tablet Take 1,000 mg by mouth every 6 (six) hours as needed for moderate pain or headache.    [provider]  ascorbic acid (VITAMIN C) 500 MG tablet Take 500 mg by mouth daily.    [provider]  budesonide (ENTOCORT EC) 3 MG 24 hr capsule Take 3 capsules (9 mg total) by mouth daily. Patient not taking: Reported on 01/27/2022 11/26/21   [name], MD  Cholecalciferol (VITAMIN D) 50 MCG (2000 UT) tablet Take 6,000 Units by mouth daily.    [provider]  ferrous sulfate 325 (65 FE) MG EC tablet Take 325 mg by mouth in the morning and at bedtime.    [provider]  Magnesium 250 MG TABS Take 250 mg by mouth daily.    [provider]  Mesalamine (ASACOL) 400 MG CPDR DR capsule Take 1 capsule (400 mg total) by mouth 2 (two) times daily. Patient taking differently: Take 800 mg by mouth 3 (three) times daily. 04/12/21    Kathie Dike, MD  polyethylene glycol (MIRALAX / GLYCOLAX) 17 g packet Take 17 g by mouth daily as needed for mild constipation.    [provider]  PRESCRIPTION MEDICATION Place 1 Application rectally 3 (three) times daily. Medication: Diltiazem 2% CR/LIDOC 2% oint.  For anal fissure pain    [provider]  senna-docusate (SENOKOT-S) 8.6-50 MG tablet Take 1 tablet by mouth at bedtime as needed for moderate constipation. 01/29/22   Amin, Jeanella Flattery, MD  SUPER B COMPLEX/C PO Take 1 tablet by mouth daily.    [provider]  prochlorperazine (COMPAZINE) 10 MG tablet Take 1 tablet (10 mg total) by mouth every 6 (six) hours as needed (Nausea or vomiting). 03/14/20 10/10/20  [name], MD      Allergies    Amoxicillin    Review of Systems   Review of Systems  Skin:        Abscess near rectum    Physical Exam Updated Vital Signs BP 118/70   Pulse 94   Temp 97.7 F (36.5 C) (Oral)   Resp 18   SpO2 97%  Physical Exam Vitals and nursing note reviewed. Exam conducted with a chaperone present.  Constitutional:      General: He is not in acute distress.    Appearance: He is well-developed. He is not ill-appearing, toxic-appearing or diaphoretic.  HENT:     Head: Normocephalic and atraumatic.  Eyes:  Conjunctiva/sclera: Conjunctivae normal.  Neck:     Comments: Very supple, no meningismus or torticollis Cardiovascular:     Rate and Rhythm: Normal rate and regular rhythm.     Pulses: Normal pulses.     Heart sounds: No murmur heard. Pulmonary:     Effort: Pulmonary effort is normal. No respiratory distress.     Breath sounds: Normal breath sounds.  Abdominal:     General: There is no distension.     Palpations: Abdomen is soft.     Tenderness: There is no abdominal tenderness. There is no right CVA tenderness, left CVA tenderness or guarding.  Genitourinary:    Comments: Large, tender, deep, palpable mass of the right perirectal area.  Without  evidence of drainage.  Some fluctuance, with some areas of induration on palpation.  Ill-defined borders.  No evidence of fissure. Musculoskeletal:        General: No swelling.     Cervical back: Neck supple. No rigidity.  Skin:    General: Skin is warm and dry.     Capillary Refill: Capillary refill takes less than 2 seconds.  Neurological:     Mental Status: He is alert and oriented to person, place, and time.  Psychiatric:        Mood and Affect: Mood normal.     ED Results / Procedures / Treatments   Labs (all labs ordered are listed, but only abnormal results are displayed) Labs Reviewed  COMPREHENSIVE METABOLIC PANEL - Abnormal; Notable for the following components:      Result Value   Glucose, Bld 121 (*)    BUN 23 (*)    All other components within normal limits  CBC WITH DIFFERENTIAL/PLATELET - Abnormal; Notable for the following components:   WBC 16.3 (*)    RBC 4.21 (*)    Hemoglobin 11.4 (*)    HCT 37.6 (*)    RDW 17.2 (*)    Platelets 413 (*)    Neutro Abs 14.7 (*)    Abs Immature Granulocytes 0.08 (*)    All other components within normal limits    EKG None  Radiology CT Abdomen Pelvis W Contrast  Result Date: 03/06/2022 CLINICAL DATA:  Retroperitoneal abscess. EXAM: CT ABDOMEN AND PELVIS WITH CONTRAST TECHNIQUE: Multidetector CT imaging of the abdomen and pelvis was performed using the standard protocol following bolus administration of intravenous contrast. RADIATION DOSE REDUCTION: This exam was performed according to the departmental dose-optimization program which includes automated exposure control, adjustment of the mA and/or kV according to patient size and/or use of iterative reconstruction technique. CONTRAST:  142m OMNIPAQUE IOHEXOL 300 MG/ML  SOLN COMPARISON:  CT abdomen and pelvis 01/27/2022 FINDINGS: Lower chest: No acute abnormality. Hepatobiliary: No focal liver abnormality is seen. No gallstones, gallbladder wall thickening, or biliary  dilatation. Pancreas: Unremarkable. No pancreatic ductal dilatation or surrounding inflammatory changes. Spleen: Normal in size without focal abnormality. Adrenals/Urinary Tract: Adrenal glands are unremarkable. Kidneys are normal, without renal calculi, focal lesion, or hydronephrosis. Bladder is unremarkable. Stomach/Bowel: There is mild wall thickening of the sigmoid colon versus normal under distension. No dilated bowel loops, pneumatosis or free air. Appendix, small bowel and stomach are within normal limits. Vascular/Lymphatic: No significant vascular findings are present. No enlarged abdominal or pelvic lymph nodes. Reproductive: Prostate is unremarkable. Other: There is an enhancing fluid collection in the subcutaneous tissues of the inferior medial right buttocks extending to the lateral right aspect of the anus worrisome for abscess. This measures 2.1 x 3.9 x  5.2 cm. There is no ascites. No focal abdominal wall hernia. Musculoskeletal: No acute or significant osseous findings. IMPRESSION: 1. Right perianal abscess extending into the inferior right buttocks measuring 2.3 x 3.9 x 5.2 cm. 2. Questionable wall thickening of the sigmoid colon. Correlate clinically for nonspecific colitis. Electronically Signed   By: Ronney Asters M.D.   On: 03/06/2022 15:25    Procedures .Marland KitchenIncision and Drainage  Date/Time: 03/08/2022 11:47 AM  Performed by: Prince Rome, PA-C Authorized by: Prince Rome, PA-C   Consent:    Consent obtained:  Verbal   Consent given by:  Patient   Risks, benefits, and alternatives were discussed: yes     Risks discussed:  Bleeding, incomplete drainage, pain and damage to other organs   Alternatives discussed:  No treatment, delayed treatment, alternative treatment, referral and observation Universal protocol:    Procedure explained and questions answered to patient or proxy's satisfaction: yes     Site/side marked: yes     Immediately prior to procedure, a time out  was called: yes     Patient identity confirmed:  Verbally with patient and arm band Location:    Type:  Abscess   Size:  5 x 3 x 2.5cm   Location:  Anogenital   Anogenital location:  Perianal Pre-procedure details:    Skin preparation:  Povidone-iodine Sedation:    Sedation type:  None Anesthesia:    Anesthesia method:  Local infiltration   Local anesthetic:  Lidocaine 2% WITH epi Procedure type:    Complexity:  Complex Procedure details:    Ultrasound guidance: yes     Incision types:  Single straight   Incision depth:  Subcutaneous   Wound management:  Probed and deloculated, irrigated with saline and extensive cleaning   Drainage:  Purulent and bloody   Drainage amount:  Copious   Wound treatment:  Wound left open   Packing materials:  1/2 in iodoform gauze Post-procedure details:    Procedure completion:  Tolerated well, no immediate complications Comments:     Unable to place drain due to abscess positioning and structure     Medications Ordered in ED Medications  iohexol (OMNIPAQUE) 300 MG/ML solution 100 mL (100 mLs Intravenous Contrast Given 03/06/22 1450)  HYDROcodone-acetaminophen (NORCO/VICODIN) 5-325 MG per tablet 1 tablet (1 tablet Oral Given 03/06/22 2245)    ED Course/ Medical Decision Making/ A&P Clinical Course as of 03/08/22 1149  Sat Mar 06, 2022  1608 Consulted with Dr. Johney Maine of general surgery.  Discussed imaging, history, and labs today.  Recommends I&D in the ED with 1 week follow-up for wound recheck.  Believes the likelihood of a sinus tract or associated fistula is low also at this time.  Recommends antibiotic therapy and close follow-up with GI as well. [AC]    Clinical Course User Index [AC] Prince Rome, PA-C                           Medical Decision Making Amount and/or Complexity of Data Reviewed Labs: ordered. Radiology: ordered.  Risk Prescription drug management.   26 y.o. male presents to the ED for concern of Abscess    This involves an extensive number of treatment options, and is a complaint that carries with it a high risk of complications and morbidity.  The emergent differential diagnosis prior to evaluation includes, but is not limited to: abscess with or without fistula, cyst, nodule  This is not an exhaustive  differential.   Past Medical History / Co-morbidities / Social History: Hx of Crohn's with associated colitis, hemorrhoids, and Hodgkin lymphoma Social Determinants of Health include: None  Additional History:  Obtained by chart review.  Notably admitted to ED 1 month ago for GI bleed  Lab Tests: I ordered, and personally interpreted labs.  The pertinent results include:   WBC 16.3 with leukocytosis Mild anemia, H&H stable  Imaging Studies: I ordered imaging studies including CT abdomen and pelvis.   I independently visualized and interpreted imaging which showed RIGHT perianal abscess extending into the inferior right buttocks measuring 2.3 x 3.9 x 5.2 cm, and questionable wall thickening of the sigmoid colon I agree with the radiologist interpretation.  ED Course: Pt well-appearing on exam.  Nontoxic, nonseptic appearing in NAD.  Presenting today with an abscess of the right buttock without evidence of drainage.  Areas of induration with areas of fluctuance appreciated.  Worsening over the last month.  Currently on Bactrim, though still worsening.  Due to extensive history of Crohn's, colitis, and recent remission of Hodgkin's lymphoma, plan to assess for possible complications with abscess.  CT imaging pending. CT imaging indicates 5.2 x 3.9 x 2.3 cm abscess.  Also indicates questionable colitis, however patient without symptoms or other exam findings to suggest this.  Consulted with Dr. Johney Maine of general surgery, who reviewed patient's history and imaging.  Recommended I&D in the ED today, with 1 week follow-up for wound recheck.  Also recommended close follow-up with GI for improved control  of Crohn's disease, as this is likely a complication of the disease.   Shared decision making with patient, who elected to proceed with I&D today.  Patient with skin abscess amenable to incision and drainage.  Abscess was large enough to warrant packing, however would not accommodate a drain.  Patient to follow-up for wound recheck in 6 days with general surgery.  Provided post I&D education information.  Mild signs of cellulitis is surrounding skin.  Due to the amount of drainage, size of the abscess, and patient's pertinent history, added antibiotic coverage provided.  Recommend close follow-up with GI as well in the next couple days.  Patient reports satisfaction with today's encounter.  Patient in NAD and in good condition at time of discharge.  Disposition: After consideration the patient's encounter today, I do not feel today's workup suggests an emergent condition requiring admission or immediate intervention beyond what has been performed at this time.  Safe for discharge; instructed to return immediately for worsening symptoms, change in symptoms or any other concerns.  I have reviewed the patients home medicines and have made adjustments as needed.  Discussed course of treatment with the patient, whom demonstrated understanding.  Patient in agreement and has no further questions.    I discussed this case with my attending physician Dr. Billy Fischer, who agreed with the proposed treatment course and cosigned this note including patient's presenting symptoms, physical exam, and planned diagnostics and interventions.  Attending physician stated agreement with plan or made changes to plan which were implemented.     This chart was dictated using voice recognition software.  Despite best efforts to proofread, errors can occur which can change the documentation meaning.         Final Clinical Impression(s) / ED Diagnoses Final diagnoses:  Perianal abscess    Rx / DC Orders ED Discharge  Orders          Ordered    doxycycline (VIBRAMYCIN) 100 MG capsule  2  times daily        03/06/22 2125              Prince Rome, PA-C 22/44/97 1210    Gareth Morgan, MD 03/12/22 1320

## 2022-03-06 NOTE — ED Triage Notes (Signed)
Pt reports cyst on bottom. Pt reports taking antibiotics currently and it has gotten worse.

## 2022-03-06 NOTE — ED Notes (Signed)
Wound care provided to pt. Pt given gauzes and wound dressing bandages until appointment.

## 2022-03-06 NOTE — Discharge Instructions (Addendum)
Continue taking your Bactrim as prescribed.  An additional antibiotic by the name of doxycycline has been sent to your pharmacy.  Take 1 capsule every 12 hours for the next 7 days.  Continue to manage additional pain with Tylenol, rest, and follow incision and drainage aftercare instructions that have been provided for you in your discharge paperwork.  As a reminder, keep the area clean, do not submerge underwater, and you may remove 0.5 cm of the packing per day.  It is vital that you follow-up with both general surgery and gastroenterology.  A local general surgeon by the name of Dr. Johney Maine has been provided for you, please call Monday morning to schedule a follow-up appointment for Friday for wound recheck..  Please call your gastroenterologist Monday morning as well to be seen within the next few days for reevaluation and continued medical management.  Return to the ED for any new or worsening symptoms as discussed.

## 2022-08-26 ENCOUNTER — Encounter: Payer: Self-pay | Admitting: Hematology and Oncology

## 2022-08-26 ENCOUNTER — Other Ambulatory Visit: Payer: Self-pay

## 2022-08-26 ENCOUNTER — Inpatient Hospital Stay: Payer: 59 | Admitting: Hematology and Oncology

## 2022-08-26 ENCOUNTER — Inpatient Hospital Stay: Payer: 59 | Attending: Hematology and Oncology

## 2022-08-26 VITALS — BP 141/66 | HR 85 | Temp 97.8°F | Resp 18 | Ht 77.0 in | Wt 266.8 lb

## 2022-08-26 DIAGNOSIS — K50919 Crohn's disease, unspecified, with unspecified complications: Secondary | ICD-10-CM

## 2022-08-26 DIAGNOSIS — D509 Iron deficiency anemia, unspecified: Secondary | ICD-10-CM | POA: Insufficient documentation

## 2022-08-26 DIAGNOSIS — C8111 Nodular sclerosis classical Hodgkin lymphoma, lymph nodes of head, face, and neck: Secondary | ICD-10-CM | POA: Diagnosis not present

## 2022-08-26 DIAGNOSIS — C8190 Hodgkin lymphoma, unspecified, unspecified site: Secondary | ICD-10-CM | POA: Diagnosis not present

## 2022-08-26 DIAGNOSIS — R590 Localized enlarged lymph nodes: Secondary | ICD-10-CM

## 2022-08-26 LAB — COMPREHENSIVE METABOLIC PANEL
ALT: 16 U/L (ref 0–44)
AST: 19 U/L (ref 15–41)
Albumin: 4.1 g/dL (ref 3.5–5.0)
Alkaline Phosphatase: 45 U/L (ref 38–126)
Anion gap: 5 (ref 5–15)
BUN: 19 mg/dL (ref 6–20)
CO2: 29 mmol/L (ref 22–32)
Calcium: 9 mg/dL (ref 8.9–10.3)
Chloride: 105 mmol/L (ref 98–111)
Creatinine, Ser: 0.88 mg/dL (ref 0.61–1.24)
GFR, Estimated: >60 mL/min (ref >60)
Glucose, Bld: 73 mg/dL (ref 70–99)
Potassium: 4.1 mmol/L (ref 3.5–5.1)
Sodium: 139 mmol/L (ref 135–145)
Total Bilirubin: 0.3 mg/dL (ref 0.3–1.2)
Total Protein: 7.1 g/dL (ref 6.5–8.1)

## 2022-08-26 LAB — CBC WITH DIFFERENTIAL/PLATELET
Abs Immature Granulocytes: 0.02 10*3/uL (ref 0.00–0.07)
Basophils Absolute: 0.1 10*3/uL (ref 0.0–0.1)
Basophils Relative: 1 %
Eosinophils Absolute: 0.3 10*3/uL (ref 0.0–0.5)
Eosinophils Relative: 4 %
HCT: 38.1 % — ABNORMAL LOW (ref 39.0–52.0)
Hemoglobin: 12.9 g/dL — ABNORMAL LOW (ref 13.0–17.0)
Immature Granulocytes: 0 %
Lymphocytes Relative: 40 %
Lymphs Abs: 2.8 10*3/uL (ref 0.7–4.0)
MCH: 29.5 pg (ref 26.0–34.0)
MCHC: 33.9 g/dL (ref 30.0–36.0)
MCV: 87.2 fL (ref 80.0–100.0)
Monocytes Absolute: 1.3 10*3/uL — ABNORMAL HIGH (ref 0.1–1.0)
Monocytes Relative: 18 %
Neutro Abs: 2.6 10*3/uL (ref 1.7–7.7)
Neutrophils Relative %: 37 %
Platelets: 303 10*3/uL (ref 150–400)
RBC: 4.37 MIL/uL (ref 4.22–5.81)
RDW: 15.1 % (ref 11.5–15.5)
WBC: 7.1 10*3/uL (ref 4.0–10.5)
nRBC: 0 % (ref 0.0–0.2)

## 2022-08-26 NOTE — Assessment & Plan Note (Signed)
He has no clinical signs or symptoms of cancer recurrence I recommend CT imaging for evaluation and he is in agreement Due to changes in insurance, we are in agreement to delay until mid April for imaging study

## 2022-08-26 NOTE — Assessment & Plan Note (Signed)
He will continue oral iron supplement

## 2022-08-26 NOTE — Assessment & Plan Note (Signed)
His symptoms are suboptimally controlled I would defer to his gastroenterologist for management

## 2022-08-26 NOTE — Progress Notes (Signed)
[facility] PROGRESS NOTE  Patient Care Team: [name], MD as PCP - General (Family Medicine) Verlee Monte, MD as Referring Physician (Gastroenterology) [name], MD as Consulting Physician (Gastroenterology) [name], MD as Consulting Physician (Hematology and Oncology) Izora Gala, MD as Consulting Physician (Otolaryngology) Franchot Gallo, MD as Consulting Physician (Urology)  ASSESSMENT & PLAN:  Hodgkin lymphoma [facility]) He has no clinical signs or symptoms of cancer recurrence I recommend CT imaging for evaluation and he is in agreement Due to changes in insurance, we are in agreement to delay until mid April for imaging study  Iron deficiency anemia He will continue oral iron supplement  Crohn disease ([place]) His symptoms are suboptimally controlled I would defer to his gastroenterologist for management  Orders Placed This Encounter  Procedures   CT CHEST ABDOMEN PELVIS W CONTRAST    Standing Status:   Future    Standing Expiration Date:   08/26/2023    Order Specific Question:   Preferred imaging location?    Answer:   [facility]    Order Specific Question:   Radiology Contrast Protocol - do NOT remove file path    Answer:   [email or web address]    All questions were answered. The patient knows to call the clinic with any problems, questions or concerns. The total time spent in the appointment was 20 minutes encounter with patients including review of chart and various tests results, discussions about plan of care and coordination of care plan   [name], MD 08/26/2022 4:52 PM  INTERVAL HISTORY: Please see below for problem oriented charting. he returns for surveillance follow-up for history of Hodgkin lymphoma He is doing well His Crohn's disease is suboptimally controlled and he tends to have breakthrough symptoms a few days leading to his next treatment Last year, he  developed rectal abscess but that has resolved No new lymphadenopathy Denies recent infection, fever or chills  REVIEW OF SYSTEMS:   Constitutional: Denies fevers, chills or abnormal weight loss Eyes: Denies blurriness of vision Ears, nose, mouth, throat, and face: Denies mucositis or sore throat Respiratory: Denies cough, dyspnea or wheezes Cardiovascular: Denies palpitation, chest discomfort or lower extremity swelling Gastrointestinal:  Denies nausea, heartburn or change in bowel habits Skin: Denies abnormal skin rashes Lymphatics: Denies new lymphadenopathy or easy bruising Neurological:Denies numbness, tingling or new weaknesses Behavioral/Psych: Mood is stable, no new changes  All other systems were reviewed with the patient and are negative.  I have reviewed the past medical history, past surgical history, social history and family history with the patient and they are unchanged from previous note.  ALLERGIES:  is allergic to amoxicillin.  MEDICATIONS:  Current Outpatient Medications  Medication Sig Dispense Refill   acetaminophen (TYLENOL) 500 MG tablet Take 1,000 mg by mouth every 6 (six) hours as needed for moderate pain or headache.     ascorbic acid (VITAMIN C) 500 MG tablet Take 500 mg by mouth daily.     Cholecalciferol (VITAMIN D) 50 MCG (2000 UT) tablet Take 5,000 Units by mouth daily.     ferrous sulfate 325 (65 FE) MG EC tablet Take 325 mg by mouth in the morning and at bedtime.     Magnesium 250 MG TABS Take 250 mg by mouth daily.     polyethylene glycol (MIRALAX / GLYCOLAX) 17 g packet Take 17 g by mouth daily as needed for mild constipation.     senna-docusate (SENOKOT-S) 8.6-50 MG tablet Take 1 tablet by mouth at  bedtime as needed for moderate constipation. 30 tablet 0   sulfaSALAzine (AZULFIDINE) 500 MG tablet Take 500 mg by mouth 4 (four) times daily.     SUPER B COMPLEX/C PO Take 1 tablet by mouth daily.     No current facility-administered medications for  this visit.    SUMMARY OF ONCOLOGIC HISTORY: Oncology History  Hodgkin lymphoma ([place])  03/10/2020 Pathology Results   A. LYMPH NODE, RIGHT SUBMANDIBULAR, BIOPSY:  -  Classic Hodgkin lymphoma  -  See comment   COMMENT:   The submitted lymph nodes are effaced by a vaguely nodular lymphoid proliferation separated by bands of fibrosis with admixed inflammatory cells including neutrophils and eosinophils. The lymphoid proliferation  is composed predominantly of small mature lymphocytes with scattered large atypical cells. The atypical cells have mono and multi-lobated nuclei and prominent nucleoli characteristic of Reed-Sternberg cells.  Lacunar cells are also present.   The neoplastic cells are CD30, CD15 (subset), Pax-5 (dim) and CD20 positive by immunohistochemistry. EBV by in-situ hybridization is positive in the Reed-Sternberg cells. CD3 highlights the background  T-cells.  EBV in-situ hybridization is negative.   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    03/14/2020 Initial Diagnosis   Hodgkin lymphoma ([place])   03/14/2020 Cancer Staging   Staging form: Hodgkin and Non-Hodgkin Lymphoma, AJCC 8th Edition - Clinical stage from 03/14/2020: Stage I (Hodgkin lymphoma, B - Symptoms) - Signed by [name], MD on 03/14/2020   03/18/2020 Echocardiogram   1. Left ventricular ejection fraction, by estimation, is 55 to 60%. The left ventricle has normal function. The left ventricle has no regional wall motion abnormalities. Left ventricular diastolic parameters were normal. The average left ventricular global longitudinal strain is -15.9 %. The global longitudinal strain is normal.  2. Right ventricular systolic function is normal. The right ventricular size is normal. There is normal pulmonary artery systolic pressure.  3. The mitral valve is normal in structure. No evidence of mitral valve  regurgitation. No evidence of mitral stenosis.  4. The aortic valve is normal in structure. Aortic valve regurgitation is not visualized. No aortic stenosis is present.  5. The inferior vena cava is normal in size with greater than 50% respiratory variability, suggesting right atrial pressure of 3 mmHg.   03/20/2020 Procedure   Placement of a subcutaneous port device. Catheter tip at the superior cavoatrial junction.   03/24/2020 - 09/08/2020 Chemotherapy   The patient had ABVD for chemotherapy treatment.     05/16/2020 PET scan   1. Small hypermetabolic RIGHT level II lymph node is indeterminate. Activity is similar to liver activity. No comparison available. 2. Reduction in size and no significant metabolic activity of RIGHT supraclavicular lymph node ( Deauville 1). 3. No evidence lymphoma chest, abdomen pelvis.  Normal spleen.  4. Intense metabolic activity localizing to LEFT paraspinal musculature in the cervical neck. Favor benign physiologic muscle musculature in the LEFT neck. Recommend clinical correlation for muscle spasm or trauma. If concern for unlikely muscular lymphoma, consider contrast CT or MRI of the neck.   06/10/2020 Echocardiogram    1. Left ventricular ejection fraction, by estimation, is 55 to 60%. The left ventricle has normal function. The left ventricle has no regional wall motion abnormalities. Left ventricular diastolic parameters were normal. The average left ventricular global longitudinal strain is -21.6 %. The global longitudinal strain is normal.  2. Right ventricular systolic function is normal. The right ventricular size  is normal. Tricuspid regurgitation signal is inadequate for assessing PA pressure.  3. The mitral valve is normal in structure. Trivial mitral valve regurgitation. No evidence of mitral stenosis.  4. The aortic valve is tricuspid. Aortic valve regurgitation is not visualized. No aortic stenosis is present.     10/10/2020 PET scan   Negative  PET-CT for metabolically active lymphoma.  (Deauville 1)     11/03/2020 Procedure   Successful removal of implanted Port-A-Cath.   08/24/2021 PET scan   1. Mildly enlarged and mildly hypermetabolic 1.0 cm right level 2 neck lymph node, mildly increased in metabolism and not substantially changed in size since 10/09/2020 PET-CT, equivocal for recurrent Deauville score 3 Hodgkin lymphoma. 2. No additional potential findings of metabolically active Hodgkin lymphoma. 3. New mild wall thickening with associated diffuse hypermetabolism throughout the sigmoid colon, suspicious for active inflammatory colitis in this patient with history of Crohn disease per prior imaging reports.       PHYSICAL EXAMINATION: ECOG PERFORMANCE STATUS: 1 - Symptomatic but completely ambulatory  Vitals:   08/26/22 0938  BP: (!) 141/66  Pulse: 85  Resp: 18  Temp: 97.8 F (36.6 C)  SpO2: 100%   Filed Weights   08/26/22 0938  Weight: 266 lb 12.8 oz (121 kg)    GENERAL:alert, no distress and comfortable  NEURO: alert & oriented x 3 with fluent speech, no focal motor/sensory deficits  LABORATORY DATA:  I have reviewed the data as listed    Component Value Date/Time   NA 139 08/26/2022 0909   NA 141 04/27/2013 1217   K 4.1 08/26/2022 0909   K 4.0 04/27/2013 1217   CL 105 08/26/2022 0909   CL 106 04/26/2012 1550   CO2 29 08/26/2022 0909   CO2 28 04/27/2013 1217   GLUCOSE 73 08/26/2022 0909   GLUCOSE 92 04/27/2013 1217   GLUCOSE 108 (H) 04/26/2012 1550   BUN 19 08/26/2022 0909   BUN 10.2 04/27/2013 1217   CREATININE 0.88 08/26/2022 0909   CREATININE 0.84 10/09/2020 1005   CREATININE 0.8 04/27/2013 1217   CALCIUM 9.0 08/26/2022 0909   CALCIUM 9.7 04/27/2013 1217   PROT 7.1 08/26/2022 0909   PROT 7.5 04/27/2013 1217   ALBUMIN 4.1 08/26/2022 0909   ALBUMIN 3.6 04/27/2013 1217   AST 19 08/26/2022 0909   AST 22 10/09/2020 1005   AST 14 04/27/2013 1217   ALT 16 08/26/2022 0909   ALT 20 10/09/2020  1005   ALT 23 04/27/2013 1217   ALKPHOS 45 08/26/2022 0909   ALKPHOS 64 04/27/2013 1217   BILITOT 0.3 08/26/2022 0909   BILITOT 0.7 10/09/2020 1005   BILITOT 0.86 04/27/2013 1217   GFRNONAA >60 08/26/2022 0909   GFRNONAA >60 10/09/2020 1005   GFRAA >60 03/24/2020 0853    No results found for: "SPEP", "UPEP"  Lab Results  Component Value Date   WBC 7.1 08/26/2022   NEUTROABS 2.6 08/26/2022   HGB 12.9 (L) 08/26/2022   HCT 38.1 (L) 08/26/2022   MCV 87.2 08/26/2022   PLT 303 08/26/2022      Chemistry      Component Value Date/Time   NA 139 08/26/2022 0909   NA 141 04/27/2013 1217   K 4.1 08/26/2022 0909   K 4.0 04/27/2013 1217   CL 105 08/26/2022 0909   CL 106 04/26/2012 1550   CO2 29 08/26/2022 0909   CO2 28 04/27/2013 1217   BUN 19 08/26/2022 0909   BUN 10.2 04/27/2013 1217  CREATININE 0.88 08/26/2022 0909   CREATININE 0.84 10/09/2020 1005   CREATININE 0.8 04/27/2013 1217      Component Value Date/Time   CALCIUM 9.0 08/26/2022 0909   CALCIUM 9.7 04/27/2013 1217   ALKPHOS 45 08/26/2022 0909   ALKPHOS 64 04/27/2013 1217   AST 19 08/26/2022 0909   AST 22 10/09/2020 1005   AST 14 04/27/2013 1217   ALT 16 08/26/2022 0909   ALT 20 10/09/2020 1005   ALT 23 04/27/2013 1217   BILITOT 0.3 08/26/2022 0909   BILITOT 0.7 10/09/2020 1005   BILITOT 0.86 04/27/2013 1217

## 2022-09-30 ENCOUNTER — Telehealth: Payer: Self-pay

## 2022-09-30 NOTE — Telephone Encounter (Signed)
Called to give below message. Canceled 4/19 appt with [name]. His insurance does not start until 5/1. He will call to scheduled CT in May and then call the office back once the CT is scheduled.

## 2022-09-30 NOTE — Telephone Encounter (Signed)
-----   Message from [name], MD sent at 09/29/2022  3:47 PM EDT ----- He is supposed to schedule imaging 1-2 days before seeing me Please remind him to call and schedule

## 2022-10-11 ENCOUNTER — Telehealth (HOSPITAL_BASED_OUTPATIENT_CLINIC_OR_DEPARTMENT_OTHER): Payer: Self-pay

## 2022-10-14 ENCOUNTER — Telehealth: Payer: Self-pay

## 2022-10-14 NOTE — Telephone Encounter (Signed)
Called and scheduled appt to see Dr. Bertis Ruddy on 5/14 at 8 am. He is aware of appt.

## 2022-10-15 ENCOUNTER — Inpatient Hospital Stay: Payer: 59 | Admitting: Hematology and Oncology

## 2022-11-05 ENCOUNTER — Ambulatory Visit (HOSPITAL_BASED_OUTPATIENT_CLINIC_OR_DEPARTMENT_OTHER)
Admission: RE | Admit: 2022-11-05 | Discharge: 2022-11-05 | Disposition: A | Discharge status: Home / Self Care | Payer: 59 | Source: Ambulatory Visit | Attending: Hematology and Oncology | Admitting: Hematology and Oncology

## 2022-11-05 DIAGNOSIS — C8111 Nodular sclerosis classical Hodgkin lymphoma, lymph nodes of head, face, and neck: Secondary | ICD-10-CM | POA: Insufficient documentation

## 2022-11-05 MED ORDER — IOHEXOL 300 MG/ML  SOLN
100.0000 mL | Freq: Once | INTRAMUSCULAR | Status: AC | PRN
  Administered 2022-11-05: 100 mL via INTRAVENOUS

## 2022-11-09 ENCOUNTER — Other Ambulatory Visit: Payer: Self-pay

## 2022-11-09 ENCOUNTER — Encounter: Payer: Self-pay | Admitting: Hematology and Oncology

## 2022-11-09 ENCOUNTER — Inpatient Hospital Stay: Payer: 59 | Attending: Hematology and Oncology | Admitting: Hematology and Oncology

## 2022-11-09 VITALS — BP 121/73 | HR 78 | Temp 97.6°F | Resp 18 | Ht 77.0 in | Wt 268.2 lb

## 2022-11-09 DIAGNOSIS — C8190 Hodgkin lymphoma, unspecified, unspecified site: Secondary | ICD-10-CM | POA: Diagnosis present

## 2022-11-09 DIAGNOSIS — C8111 Nodular sclerosis classical Hodgkin lymphoma, lymph nodes of head, face, and neck: Secondary | ICD-10-CM | POA: Diagnosis not present

## 2022-11-09 NOTE — Assessment & Plan Note (Signed)
I have reviewed imaging study with the patient He has no signs of cancer recurrence We discussed future follow-up I will see him once a year We discussed the role of imaging studies; I will order them once a year if there are concerns for cancer recurrence, otherwise, we will just follow clinically

## 2022-11-09 NOTE — Progress Notes (Signed)
[facility] OFFICE PROGRESS NOTE  Patient Care Team: Mila Palmer, MD as PCP - General (Family Medicine) Tish Men, MD as Referring Physician (Gastroenterology) Charlott Rakes, MD as Consulting Physician (Gastroenterology) Artis Delay, MD as Consulting Physician (Hematology and Oncology) Serena Colonel, MD as Consulting Physician (Otolaryngology) Marcine Matar, MD as Consulting Physician (Urology)  ASSESSMENT & PLAN:  Hodgkin lymphoma [facility]) I have reviewed imaging study with the patient He has no signs of cancer recurrence We discussed future follow-up I will see him once a year We discussed the role of imaging studies; I will order them once a year if there are concerns for cancer recurrence, otherwise, we will just follow clinically  Orders Placed This Encounter  Procedures   Lactate dehydrogenase    Standing Status:   Future    Standing Expiration Date:   11/09/2023   CMP (Cancer Center only)    Standing Status:   Future    Standing Expiration Date:   11/09/2023   CBC with Differential (Cancer Center Only)    Standing Status:   Future    Standing Expiration Date:   11/09/2023    All questions were answered. The patient knows to call the clinic with any problems, questions or concerns. The total time spent in the appointment was 20 minutes encounter with patients including review of chart and various tests results, discussions about plan of care and coordination of care plan   Artis Delay, MD 11/09/2022 8:27 AM  INTERVAL HISTORY: Please see below for problem oriented charting. he returns for review of test results He is still battling with issues related to his Crohn's disease He has some recent changes in his voice likely due to allergies We spent majority of our time reviewing imaging study results  REVIEW OF SYSTEMS:   Constitutional: Denies fevers, chills or abnormal weight loss Eyes: Denies blurriness of vision Ears, nose, mouth,  throat, and face: Denies mucositis or sore throat Respiratory: Denies cough, dyspnea or wheezes Cardiovascular: Denies palpitation, chest discomfort or lower extremity swelling Skin: Denies abnormal skin rashes Lymphatics: Denies new lymphadenopathy or easy bruising Neurological:Denies numbness, tingling or new weaknesses Behavioral/Psych: Mood is stable, no new changes  All other systems were reviewed with the patient and are negative.  I have reviewed the past medical history, past surgical history, social history and family history with the patient and they are unchanged from previous note.  ALLERGIES:  is allergic to amoxicillin.  MEDICATIONS:  Current Outpatient Medications  Medication Sig Dispense Refill   acetaminophen (TYLENOL) 500 MG tablet Take 1,000 mg by mouth every 6 (six) hours as needed for moderate pain or headache.     ascorbic acid (VITAMIN C) 500 MG tablet Take 500 mg by mouth daily.     Cholecalciferol (VITAMIN D) 50 MCG (2000 UT) tablet Take 5,000 Units by mouth daily.     ferrous sulfate 325 (65 FE) MG EC tablet Take 325 mg by mouth in the morning and at bedtime.     Magnesium 250 MG TABS Take 250 mg by mouth daily.     polyethylene glycol (MIRALAX / GLYCOLAX) 17 g packet Take 17 g by mouth daily as needed for mild constipation.     senna-docusate (SENOKOT-S) 8.6-50 MG tablet Take 1 tablet by mouth at bedtime as needed for moderate constipation. 30 tablet 0   sulfaSALAzine (AZULFIDINE) 500 MG tablet Take 500 mg by mouth 4 (four) times daily.     SUPER B COMPLEX/C PO Take 1 tablet  by mouth daily.     No current facility-administered medications for this visit.    SUMMARY OF ONCOLOGIC HISTORY: Oncology History  Hodgkin lymphoma (HCC)  03/10/2020 Pathology Results   A. LYMPH NODE, RIGHT SUBMANDIBULAR, BIOPSY:  -  Classic Hodgkin lymphoma  -  See comment   COMMENT:   The submitted lymph nodes are effaced by a vaguely nodular lymphoid proliferation separated by  bands of fibrosis with admixed inflammatory cells including neutrophils and eosinophils. The lymphoid proliferation  is composed predominantly of small mature lymphocytes with scattered large atypical cells. The atypical cells have mono and multi-lobated nuclei and prominent nucleoli characteristic of Reed-Sternberg cells.  Lacunar cells are also present.   The neoplastic cells are CD30, CD15 (subset), Pax-5 (dim) and CD20 positive by immunohistochemistry. EBV by in-situ hybridization is positive in the Reed-Sternberg cells. CD3 highlights the background  T-cells.  EBV in-situ hybridization is negative.   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    03/14/2020 Initial Diagnosis   Hodgkin lymphoma (HCC)   03/14/2020 Cancer Staging   Staging form: Hodgkin and Non-Hodgkin Lymphoma, AJCC 8th Edition - Clinical stage from 03/14/2020: Stage I (Hodgkin lymphoma, B - Symptoms) - Signed by Artis Delay, MD on 03/14/2020   03/18/2020 Echocardiogram   1. Left ventricular ejection fraction, by estimation, is 55 to 60%. The left ventricle has normal function. The left ventricle has no regional wall motion abnormalities. Left ventricular diastolic parameters were normal. The average left ventricular global longitudinal strain is -15.9 %. The global longitudinal strain is normal.  2. Right ventricular systolic function is normal. The right ventricular size is normal. There is normal pulmonary artery systolic pressure.  3. The mitral valve is normal in structure. No evidence of mitral valve regurgitation. No evidence of mitral stenosis.  4. The aortic valve is normal in structure. Aortic valve regurgitation is not visualized. No aortic stenosis is present.  5. The inferior vena cava is normal in size with greater than 50% respiratory variability, suggesting right atrial pressure of 3 mmHg.   03/20/2020 Procedure    Placement of a subcutaneous port device. Catheter tip at the superior cavoatrial junction.   03/24/2020 - 09/08/2020 Chemotherapy   The patient had ABVD for chemotherapy treatment.     05/16/2020 PET scan   1. Small hypermetabolic RIGHT level II lymph node is indeterminate. Activity is similar to liver activity. No comparison available. 2. Reduction in size and no significant metabolic activity of RIGHT supraclavicular lymph node ( Deauville 1). 3. No evidence lymphoma chest, abdomen pelvis.  Normal spleen.  4. Intense metabolic activity localizing to LEFT paraspinal musculature in the cervical neck. Favor benign physiologic muscle musculature in the LEFT neck. Recommend clinical correlation for muscle spasm or trauma. If concern for unlikely muscular lymphoma, consider contrast CT or MRI of the neck.   06/10/2020 Echocardiogram    1. Left ventricular ejection fraction, by estimation, is 55 to 60%. The left ventricle has normal function. The left ventricle has no regional wall motion abnormalities. Left ventricular diastolic parameters were normal. The average left ventricular global longitudinal strain is -21.6 %. The global longitudinal strain is normal.  2. Right ventricular systolic function is normal. The right ventricular size is normal. Tricuspid regurgitation signal is inadequate for assessing PA pressure.  3. The mitral valve is normal in structure. Trivial mitral valve regurgitation. No evidence of mitral stenosis.  4. The aortic valve is tricuspid.  Aortic valve regurgitation is not visualized. No aortic stenosis is present.     10/10/2020 PET scan   Negative PET-CT for metabolically active lymphoma.  (Deauville 1)     11/03/2020 Procedure   Successful removal of implanted Port-A-Cath.   08/24/2021 PET scan   1. Mildly enlarged and mildly hypermetabolic 1.0 cm right level 2 neck lymph node, mildly increased in metabolism and not substantially changed in size since 10/09/2020 PET-CT,  equivocal for recurrent Deauville score 3 Hodgkin lymphoma. 2. No additional potential findings of metabolically active Hodgkin lymphoma. 3. New mild wall thickening with associated diffuse hypermetabolism throughout the sigmoid colon, suspicious for active inflammatory colitis in this patient with history of Crohn disease per prior imaging reports.     11/08/2022 Imaging   CT CHEST ABDOMEN PELVIS W CONTRAST  Result Date: 11/07/2022 CLINICAL DATA:  History of nodular sclerosing Hodgkin's lymphoma, monitor. * Tracking Code: BO * EXAM: CT CHEST, ABDOMEN, AND PELVIS WITH CONTRAST TECHNIQUE: Multidetector CT imaging of the chest, abdomen and pelvis was performed following the standard protocol during bolus administration of intravenous contrast. RADIATION DOSE REDUCTION: This exam was performed according to the departmental dose-optimization program which includes automated exposure control, adjustment of the mA and/or kV according to patient size and/or use of iterative reconstruction technique. CONTRAST:  OMNIPAQUE IOHEXOL 300 MG/ML  SOLN COMPARISON:  Multiple priors including CT March 06, 2022 and PET-CT August 21, 2021 FINDINGS: CT CHEST FINDINGS Cardiovascular: Normal caliber abdominal aorta. No central pulmonary embolus on this nondedicated study. Normal size heart. No significant pericardial effusion/thickening. Mediastinum/Nodes: No suspicious thyroid nodule. No pathologically enlarged mediastinal, hilar or axillary lymph nodes. The esophagus is grossly unremarkable. Lungs/Pleura: Stable 4 mm pulmonary nodule along the right minor fissure on image 87/4. No new suspicious pulmonary nodules or masses. No pleural effusion. No pneumothorax. Musculoskeletal: No aggressive lytic or blastic lesion of bone. CT ABDOMEN PELVIS FINDINGS Hepatobiliary: No suspicious hepatic lesion. Gallbladder is unremarkable. No biliary ductal dilation. Pancreas: No pancreatic ductal dilation or evidence of acute  inflammation. Spleen: No splenomegaly. Adrenals/Urinary Tract: Bilateral adrenal glands appear normal. No hydronephrosis. Kidneys demonstrate symmetric enhancement. Urinary bladder is unremarkable for degree of distension. Stomach/Bowel: Radiopaque enteric contrast material traverses the descending colon. Stomach is unremarkable for degree of distension. No pathologic dilation of small or large bowel. No convincing evidence of acute bowel inflammation. Vascular/Lymphatic: Normal caliber abdominal aorta. Smooth IVC contours. The portal, splenic and superior mesenteric veins are patent. No pathologically enlarged abdominal or pelvic lymph nodes. Reproductive: Prostate is unremarkable. Other: Linear band of right-sided perianal soft tissue on image 142/2 compatible with scarring/granulation tissue from the prior perianal abscess. Musculoskeletal: No acute or significant osseous findings. IMPRESSION: No thoracic or abdominopelvic adenopathy and no splenomegaly. Electronically Signed   By: Maudry Mayhew M.D.   On: 11/07/2022 08:25        PHYSICAL EXAMINATION: ECOG PERFORMANCE STATUS: 1 - Symptomatic but completely ambulatory  Vitals:   11/09/22 0815  BP: 121/73  Pulse: 78  Resp: 18  Temp: 97.6 F (36.4 C)  SpO2: 100%   Filed Weights   11/09/22 0815  Weight: 268 lb 3.2 oz (121.7 kg)    GENERAL:alert, no distress and comfortable NEURO: alert & oriented x 3 with fluent speech, no focal motor/sensory deficits  LABORATORY DATA:  I have reviewed the data as listed    Component Value Date/Time   NA 139 08/26/2022 0909   NA 141 04/27/2013 1217   K 4.1 08/26/2022 0909  K 4.0 04/27/2013 1217   CL 105 08/26/2022 0909   CL 106 04/26/2012 1550   CO2 29 08/26/2022 0909   CO2 28 04/27/2013 1217   GLUCOSE 73 08/26/2022 0909   GLUCOSE 92 04/27/2013 1217   GLUCOSE 108 (H) 04/26/2012 1550   BUN 19 08/26/2022 0909   BUN 10.2 04/27/2013 1217   CREATININE 0.88 08/26/2022 0909   CREATININE 0.84  10/09/2020 1005   CREATININE 0.8 04/27/2013 1217   CALCIUM 9.0 08/26/2022 0909   CALCIUM 9.7 04/27/2013 1217   PROT 7.1 08/26/2022 0909   PROT 7.5 04/27/2013 1217   ALBUMIN 4.1 08/26/2022 0909   ALBUMIN 3.6 04/27/2013 1217   AST 19 08/26/2022 0909   AST 22 10/09/2020 1005   AST 14 04/27/2013 1217   ALT 16 08/26/2022 0909   ALT 20 10/09/2020 1005   ALT 23 04/27/2013 1217   ALKPHOS 45 08/26/2022 0909   ALKPHOS 64 04/27/2013 1217   BILITOT 0.3 08/26/2022 0909   BILITOT 0.7 10/09/2020 1005   BILITOT 0.86 04/27/2013 1217   GFRNONAA >60 08/26/2022 0909   GFRNONAA >60 10/09/2020 1005   GFRAA >60 03/24/2020 0853    No results found for: "SPEP", "UPEP"  Lab Results  Component Value Date   WBC 7.1 08/26/2022   NEUTROABS 2.6 08/26/2022   HGB 12.9 (L) 08/26/2022   HCT 38.1 (L) 08/26/2022   MCV 87.2 08/26/2022   PLT 303 08/26/2022      Chemistry      Component Value Date/Time   NA 139 08/26/2022 0909   NA 141 04/27/2013 1217   K 4.1 08/26/2022 0909   K 4.0 04/27/2013 1217   CL 105 08/26/2022 0909   CL 106 04/26/2012 1550   CO2 29 08/26/2022 0909   CO2 28 04/27/2013 1217   BUN 19 08/26/2022 0909   BUN 10.2 04/27/2013 1217   CREATININE 0.88 08/26/2022 0909   CREATININE 0.84 10/09/2020 1005   CREATININE 0.8 04/27/2013 1217      Component Value Date/Time   CALCIUM 9.0 08/26/2022 0909   CALCIUM 9.7 04/27/2013 1217   ALKPHOS 45 08/26/2022 0909   ALKPHOS 64 04/27/2013 1217   AST 19 08/26/2022 0909   AST 22 10/09/2020 1005   AST 14 04/27/2013 1217   ALT 16 08/26/2022 0909   ALT 20 10/09/2020 1005   ALT 23 04/27/2013 1217   BILITOT 0.3 08/26/2022 0909   BILITOT 0.7 10/09/2020 1005   BILITOT 0.86 04/27/2013 1217       RADIOGRAPHIC STUDIES: I have reviewed imaging studies with the patient I have personally reviewed the radiological images as listed and agreed with the findings in the report. CT CHEST ABDOMEN PELVIS W CONTRAST  Result Date: 11/07/2022 CLINICAL DATA:   History of nodular sclerosing Hodgkin's lymphoma, monitor. * Tracking Code: BO * EXAM: CT CHEST, ABDOMEN, AND PELVIS WITH CONTRAST TECHNIQUE: Multidetector CT imaging of the chest, abdomen and pelvis was performed following the standard protocol during bolus administration of intravenous contrast. RADIATION DOSE REDUCTION: This exam was performed according to the departmental dose-optimization program which includes automated exposure control, adjustment of the mA and/or kV according to patient size and/or use of iterative reconstruction technique. CONTRAST:  OMNIPAQUE IOHEXOL 300 MG/ML  SOLN COMPARISON:  Multiple priors including CT March 06, 2022 and PET-CT August 21, 2021 FINDINGS: CT CHEST FINDINGS Cardiovascular: Normal caliber abdominal aorta. No central pulmonary embolus on this nondedicated study. Normal size heart. No significant pericardial effusion/thickening. Mediastinum/Nodes: No suspicious thyroid nodule. No  pathologically enlarged mediastinal, hilar or axillary lymph nodes. The esophagus is grossly unremarkable. Lungs/Pleura: Stable 4 mm pulmonary nodule along the right minor fissure on image 87/4. No new suspicious pulmonary nodules or masses. No pleural effusion. No pneumothorax. Musculoskeletal: No aggressive lytic or blastic lesion of bone. CT ABDOMEN PELVIS FINDINGS Hepatobiliary: No suspicious hepatic lesion. Gallbladder is unremarkable. No biliary ductal dilation. Pancreas: No pancreatic ductal dilation or evidence of acute inflammation. Spleen: No splenomegaly. Adrenals/Urinary Tract: Bilateral adrenal glands appear normal. No hydronephrosis. Kidneys demonstrate symmetric enhancement. Urinary bladder is unremarkable for degree of distension. Stomach/Bowel: Radiopaque enteric contrast material traverses the descending colon. Stomach is unremarkable for degree of distension. No pathologic dilation of small or large bowel. No convincing evidence of acute bowel inflammation.  Vascular/Lymphatic: Normal caliber abdominal aorta. Smooth IVC contours. The portal, splenic and superior mesenteric veins are patent. No pathologically enlarged abdominal or pelvic lymph nodes. Reproductive: Prostate is unremarkable. Other: Linear band of right-sided perianal soft tissue on image 142/2 compatible with scarring/granulation tissue from the prior perianal abscess. Musculoskeletal: No acute or significant osseous findings. IMPRESSION: No thoracic or abdominopelvic adenopathy and no splenomegaly. Electronically Signed   By: Maudry Mayhew M.D.   On: 11/07/2022 08:25

## 2022-11-11 ENCOUNTER — Telehealth: Payer: Self-pay | Admitting: Hematology and Oncology

## 2022-11-11 NOTE — Telephone Encounter (Signed)
Left patient a vm regarding upcoming appointment  

## 2023-02-09 ENCOUNTER — Telehealth: Payer: Self-pay

## 2023-02-09 NOTE — Telephone Encounter (Signed)
Returned Pt's call regarding "new lump". Pt states he found a new lump along jaw line that is firm/painless. Pt denies fevers. Pt states this is "like last time" and is requesting soonest available appt. Pt scheduled for 02/09/23 with MD. Pt verbalized understanding.

## 2023-02-10 ENCOUNTER — Inpatient Hospital Stay: Payer: 59 | Attending: Hematology and Oncology | Admitting: Hematology and Oncology

## 2023-02-10 ENCOUNTER — Encounter: Payer: Self-pay | Admitting: Hematology and Oncology

## 2023-02-10 ENCOUNTER — Other Ambulatory Visit: Payer: Self-pay

## 2023-02-10 VITALS — BP 129/72 | HR 91 | Temp 98.2°F | Resp 18 | Ht 77.0 in | Wt 279.6 lb

## 2023-02-10 DIAGNOSIS — R591 Generalized enlarged lymph nodes: Secondary | ICD-10-CM | POA: Insufficient documentation

## 2023-02-10 DIAGNOSIS — K5090 Crohn's disease, unspecified, without complications: Secondary | ICD-10-CM | POA: Diagnosis not present

## 2023-02-10 DIAGNOSIS — Z8571 Personal history of Hodgkin lymphoma: Secondary | ICD-10-CM | POA: Diagnosis not present

## 2023-02-10 DIAGNOSIS — K50919 Crohn's disease, unspecified, with unspecified complications: Secondary | ICD-10-CM | POA: Diagnosis not present

## 2023-02-10 DIAGNOSIS — L270 Generalized skin eruption due to drugs and medicaments taken internally: Secondary | ICD-10-CM | POA: Diagnosis not present

## 2023-02-10 DIAGNOSIS — R5383 Other fatigue: Secondary | ICD-10-CM | POA: Diagnosis not present

## 2023-02-10 DIAGNOSIS — C8111 Nodular sclerosis classical Hodgkin lymphoma, lymph nodes of head, face, and neck: Secondary | ICD-10-CM

## 2023-02-10 NOTE — Assessment & Plan Note (Signed)
The patient has palpable lymphadenopathy in the parotid region on the right as well as bilateral axillary lymphadenopathy and mild palpable splenomegaly Given his history of Hodgkin lymphoma, I think it is prudent for [place] to rule out cancer recurrence I will order PET/CT imaging to assess

## 2023-02-10 NOTE — Assessment & Plan Note (Signed)
He is currently receiving Biologics with reasonable control I would defer to his gastroenterologist for management However, if he is found to have lymphoma recurrence, we will have to discontinue his Biologics

## 2023-02-10 NOTE — Assessment & Plan Note (Signed)
This is consistent with slight reaction to his immunologic's Observe closely

## 2023-02-10 NOTE — Progress Notes (Signed)
[facility] OFFICE PROGRESS NOTE  Patient Care Team: Mila Palmer, MD as PCP - General (Family Medicine) Tish Men, MD as Referring Physician (Gastroenterology) Charlott Rakes, MD as Consulting Physician (Gastroenterology) Artis Delay, MD as Consulting Physician (Hematology and Oncology) Serena Colonel, MD as Consulting Physician (Otolaryngology) Marcine Matar, MD as Consulting Physician (Urology)  ASSESSMENT & PLAN:  Hodgkin lymphoma [facility]) The patient has palpable lymphadenopathy in the parotid region on the right as well as bilateral axillary lymphadenopathy and mild palpable splenomegaly Given his history of Hodgkin lymphoma, I think it is prudent for [place] to rule out cancer recurrence I will order PET/CT imaging to assess  Crohn disease [facility]) He is currently receiving Biologics with reasonable control I would defer to his gastroenterologist for management However, if he is found to have lymphoma recurrence, we will have to discontinue his Biologics  Drug-induced skin rash This is consistent with slight reaction to his immunologic's Observe closely  Orders Placed This Encounter  Procedures   NM PET Image Restage (PS) Skull Base to Thigh (F-18 FDG)    Standing Status:   Future    Standing Expiration Date:   02/10/2024    Order Specific Question:   If indicated for the ordered procedure, I authorize the administration of a radiopharmaceutical per Radiology protocol    Answer:   Yes    Order Specific Question:   Preferred imaging location?    Answer:   [facility]    Order Specific Question:   Radiology Contrast Protocol - do NOT remove file path    Answer:   [email or web address]    All questions were answered. The patient knows to call the clinic with any problems, questions or concerns. The total time spent in the appointment was 30 minutes encounter with patients including review of chart and various  tests results, discussions about plan of care and coordination of care plan   Artis Delay, MD 02/10/2023 10:08 AM  INTERVAL HISTORY: Please see below for problem oriented charting. he returns for urgent evaluation He has noted progressive lymphadenopathy He palpated a lump in the angle of the jaw in the right axilla approximately 2 months ago He denies fevers or chills.  Has mild fatigue.  He is currently on immunologic's and has intermittent diarrhea He has skin rash which was attributed to his medication but he is not symptomatic  REVIEW OF SYSTEMS:   Constitutional: Denies fevers, chills or abnormal weight loss Eyes: Denies blurriness of vision Ears, nose, mouth, throat, and face: Denies mucositis or sore throat Respiratory: Denies cough, dyspnea or wheezes Cardiovascular: Denies palpitation, chest discomfort or lower extremity swelling Gastrointestinal:  Denies nausea, heartburn or change in bowel habits Lymphatics: Denies new lymphadenopathy or easy bruising Neurological:Denies numbness, tingling or new weaknesses Behavioral/Psych: Mood is stable, no new changes  All other systems were reviewed with the patient and are negative.  I have reviewed the past medical history, past surgical history, social history and family history with the patient and they are unchanged from previous note.  ALLERGIES:  is allergic to amoxicillin.  MEDICATIONS:  Current Outpatient Medications  Medication Sig Dispense Refill   acetaminophen (TYLENOL) 500 MG tablet Take 1,000 mg by mouth every 6 (six) hours as needed for moderate pain or headache.     ascorbic acid (VITAMIN C) 500 MG tablet Take 500 mg by mouth daily.     Cholecalciferol (VITAMIN D) 50 MCG (2000 UT) tablet Take 5,000 Units by mouth daily.  ferrous sulfate 325 (65 FE) MG EC tablet Take 325 mg by mouth in the morning and at bedtime.     Magnesium 250 MG TABS Take 250 mg by mouth daily.     polyethylene glycol (MIRALAX / GLYCOLAX) 17  g packet Take 17 g by mouth daily as needed for mild constipation.     senna-docusate (SENOKOT-S) 8.6-50 MG tablet Take 1 tablet by mouth at bedtime as needed for moderate constipation. 30 tablet 0   sulfaSALAzine (AZULFIDINE) 500 MG tablet Take 500 mg by mouth 4 (four) times daily.     SUPER B COMPLEX/C PO Take 1 tablet by mouth daily.     No current facility-administered medications for this visit.    SUMMARY OF ONCOLOGIC HISTORY: Oncology History  Hodgkin lymphoma (HCC)  03/10/2020 Pathology Results   A. LYMPH NODE, RIGHT SUBMANDIBULAR, BIOPSY:  -  Classic Hodgkin lymphoma  -  See comment   COMMENT:   The submitted lymph nodes are effaced by a vaguely nodular lymphoid proliferation separated by bands of fibrosis with admixed inflammatory cells including neutrophils and eosinophils. The lymphoid proliferation  is composed predominantly of small mature lymphocytes with scattered large atypical cells. The atypical cells have mono and multi-lobated nuclei and prominent nucleoli characteristic of Reed-Sternberg cells.  Lacunar cells are also present.   The neoplastic cells are CD30, CD15 (subset), Pax-5 (dim) and CD20 positive by immunohistochemistry. EBV by in-situ hybridization is positive in the Reed-Sternberg cells. CD3 highlights the background  T-cells.  EBV in-situ hybridization is negative.   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    03/14/2020 Initial Diagnosis   Hodgkin lymphoma (HCC)   03/14/2020 Cancer Staging   Staging form: Hodgkin and Non-Hodgkin Lymphoma, AJCC 8th Edition - Clinical stage from 03/14/2020: Stage I (Hodgkin lymphoma, B - Symptoms) - Signed by Artis Delay, MD on 03/14/2020   03/18/2020 Echocardiogram   1. Left ventricular ejection fraction, by estimation, is 55 to 60%. The left ventricle has normal function. The left ventricle has no regional wall motion  abnormalities. Left ventricular diastolic parameters were normal. The average left ventricular global longitudinal strain is -15.9 %. The global longitudinal strain is normal.  2. Right ventricular systolic function is normal. The right ventricular size is normal. There is normal pulmonary artery systolic pressure.  3. The mitral valve is normal in structure. No evidence of mitral valve regurgitation. No evidence of mitral stenosis.  4. The aortic valve is normal in structure. Aortic valve regurgitation is not visualized. No aortic stenosis is present.  5. The inferior vena cava is normal in size with greater than 50% respiratory variability, suggesting right atrial pressure of 3 mmHg.   03/20/2020 Procedure   Placement of a subcutaneous port device. Catheter tip at the superior cavoatrial junction.   03/24/2020 - 09/08/2020 Chemotherapy   The patient had ABVD for chemotherapy treatment.     05/16/2020 PET scan   1. Small hypermetabolic RIGHT level II lymph node is indeterminate. Activity is similar to liver activity. No comparison available. 2. Reduction in size and no significant metabolic activity of RIGHT supraclavicular lymph node ( Deauville 1). 3. No evidence lymphoma chest, abdomen pelvis.  Normal spleen.  4. Intense metabolic activity localizing to LEFT paraspinal musculature in the cervical neck. Favor benign physiologic muscle musculature in the LEFT neck. Recommend clinical correlation for muscle spasm or trauma. If concern for unlikely muscular lymphoma, consider contrast CT or  MRI of the neck.   06/10/2020 Echocardiogram    1. Left ventricular ejection fraction, by estimation, is 55 to 60%. The left ventricle has normal function. The left ventricle has no regional wall motion abnormalities. Left ventricular diastolic parameters were normal. The average left ventricular global longitudinal strain is -21.6 %. The global longitudinal strain is normal.  2. Right ventricular systolic  function is normal. The right ventricular size is normal. Tricuspid regurgitation signal is inadequate for assessing PA pressure.  3. The mitral valve is normal in structure. Trivial mitral valve regurgitation. No evidence of mitral stenosis.  4. The aortic valve is tricuspid. Aortic valve regurgitation is not visualized. No aortic stenosis is present.     10/10/2020 PET scan   Negative PET-CT for metabolically active lymphoma.  (Deauville 1)     11/03/2020 Procedure   Successful removal of implanted Port-A-Cath.   08/24/2021 PET scan   1. Mildly enlarged and mildly hypermetabolic 1.0 cm right level 2 neck lymph node, mildly increased in metabolism and not substantially changed in size since 10/09/2020 PET-CT, equivocal for recurrent Deauville score 3 Hodgkin lymphoma. 2. No additional potential findings of metabolically active Hodgkin lymphoma. 3. New mild wall thickening with associated diffuse hypermetabolism throughout the sigmoid colon, suspicious for active inflammatory colitis in this patient with history of Crohn disease per prior imaging reports.     11/08/2022 Imaging   CT CHEST ABDOMEN PELVIS W CONTRAST  Result Date: 11/07/2022 CLINICAL DATA:  History of nodular sclerosing Hodgkin's lymphoma, monitor. * Tracking Code: BO * EXAM: CT CHEST, ABDOMEN, AND PELVIS WITH CONTRAST TECHNIQUE: Multidetector CT imaging of the chest, abdomen and pelvis was performed following the standard protocol during bolus administration of intravenous contrast. RADIATION DOSE REDUCTION: This exam was performed according to the departmental dose-optimization program which includes automated exposure control, adjustment of the mA and/or kV according to patient size and/or use of iterative reconstruction technique. CONTRAST:  OMNIPAQUE IOHEXOL 300 MG/ML  SOLN COMPARISON:  Multiple priors including CT March 06, 2022 and PET-CT August 21, 2021 FINDINGS: CT CHEST FINDINGS Cardiovascular: Normal caliber  abdominal aorta. No central pulmonary embolus on this nondedicated study. Normal size heart. No significant pericardial effusion/thickening. Mediastinum/Nodes: No suspicious thyroid nodule. No pathologically enlarged mediastinal, hilar or axillary lymph nodes. The esophagus is grossly unremarkable. Lungs/Pleura: Stable 4 mm pulmonary nodule along the right minor fissure on image 87/4. No new suspicious pulmonary nodules or masses. No pleural effusion. No pneumothorax. Musculoskeletal: No aggressive lytic or blastic lesion of bone. CT ABDOMEN PELVIS FINDINGS Hepatobiliary: No suspicious hepatic lesion. Gallbladder is unremarkable. No biliary ductal dilation. Pancreas: No pancreatic ductal dilation or evidence of acute inflammation. Spleen: No splenomegaly. Adrenals/Urinary Tract: Bilateral adrenal glands appear normal. No hydronephrosis. Kidneys demonstrate symmetric enhancement. Urinary bladder is unremarkable for degree of distension. Stomach/Bowel: Radiopaque enteric contrast material traverses the descending colon. Stomach is unremarkable for degree of distension. No pathologic dilation of small or large bowel. No convincing evidence of acute bowel inflammation. Vascular/Lymphatic: Normal caliber abdominal aorta. Smooth IVC contours. The portal, splenic and superior mesenteric veins are patent. No pathologically enlarged abdominal or pelvic lymph nodes. Reproductive: Prostate is unremarkable. Other: Linear band of right-sided perianal soft tissue on image 142/2 compatible with scarring/granulation tissue from the prior perianal abscess. Musculoskeletal: No acute or significant osseous findings. IMPRESSION: No thoracic or abdominopelvic adenopathy and no splenomegaly. Electronically Signed   By: Maudry Mayhew M.D.   On: 11/07/2022 08:25        PHYSICAL EXAMINATION:  ECOG PERFORMANCE STATUS: 1 - Symptomatic but completely ambulatory  Vitals:   02/10/23 0904  BP: 129/72  Pulse: 91  Resp: 18  Temp: 98.2  F (36.8 C)  SpO2: 98%   Filed Weights   02/10/23 0904  Weight: 279 lb 9.6 oz (126.8 kg)    GENERAL:alert, no distress and comfortable SKIN: Noticed erythematous patches of skin consistent with drug-induced skin rash EYES: normal, Conjunctiva are pink and non-injected, sclera clear OROPHARYNX:no exudate, no erythema and lips, buccal mucosa, and tongue normal  NECK: supple, thyroid normal size, non-tender, without nodularity LYMPH he has palpable lymphadenopathy in the preauricular region on the right, bilateral axilla  LUNGS: clear to auscultation and percussion with normal breathing effort HEART: regular rate & rhythm and no murmurs and no lower extremity edema ABDOMEN:abdomen soft, non-tender and normal bowel sounds.  Palpable splenomegaly Musculoskeletal:no cyanosis of digits and no clubbing  NEURO: alert & oriented x 3 with fluent speech, no focal motor/sensory deficits  LABORATORY DATA:  I have reviewed the data as listed    Component Value Date/Time   NA 139 08/26/2022 0909   NA 141 04/27/2013 1217   K 4.1 08/26/2022 0909   K 4.0 04/27/2013 1217   CL 105 08/26/2022 0909   CL 106 04/26/2012 1550   CO2 29 08/26/2022 0909   CO2 28 04/27/2013 1217   GLUCOSE 73 08/26/2022 0909   GLUCOSE 92 04/27/2013 1217   GLUCOSE 108 (H) 04/26/2012 1550   BUN 19 08/26/2022 0909   BUN 10.2 04/27/2013 1217   CREATININE 0.88 08/26/2022 0909   CREATININE 0.84 10/09/2020 1005   CREATININE 0.8 04/27/2013 1217   CALCIUM 9.0 08/26/2022 0909   CALCIUM 9.7 04/27/2013 1217   PROT 7.1 08/26/2022 0909   PROT 7.5 04/27/2013 1217   ALBUMIN 4.1 08/26/2022 0909   ALBUMIN 3.6 04/27/2013 1217   AST 19 08/26/2022 0909   AST 22 10/09/2020 1005   AST 14 04/27/2013 1217   ALT 16 08/26/2022 0909   ALT 20 10/09/2020 1005   ALT 23 04/27/2013 1217   ALKPHOS 45 08/26/2022 0909   ALKPHOS 64 04/27/2013 1217   BILITOT 0.3 08/26/2022 0909   BILITOT 0.7 10/09/2020 1005   BILITOT 0.86 04/27/2013 1217    GFRNONAA >60 08/26/2022 0909   GFRNONAA >60 10/09/2020 1005   GFRAA >60 03/24/2020 0853    No results found for: "SPEP", "UPEP"  Lab Results  Component Value Date   WBC 7.1 08/26/2022   NEUTROABS 2.6 08/26/2022   HGB 12.9 (L) 08/26/2022   HCT 38.1 (L) 08/26/2022   MCV 87.2 08/26/2022   PLT 303 08/26/2022      Chemistry      Component Value Date/Time   NA 139 08/26/2022 0909   NA 141 04/27/2013 1217   K 4.1 08/26/2022 0909   K 4.0 04/27/2013 1217   CL 105 08/26/2022 0909   CL 106 04/26/2012 1550   CO2 29 08/26/2022 0909   CO2 28 04/27/2013 1217   BUN 19 08/26/2022 0909   BUN 10.2 04/27/2013 1217   CREATININE 0.88 08/26/2022 0909   CREATININE 0.84 10/09/2020 1005   CREATININE 0.8 04/27/2013 1217      Component Value Date/Time   CALCIUM 9.0 08/26/2022 0909   CALCIUM 9.7 04/27/2013 1217   ALKPHOS 45 08/26/2022 0909   ALKPHOS 64 04/27/2013 1217   AST 19 08/26/2022 0909   AST 22 10/09/2020 1005   AST 14 04/27/2013 1217   ALT 16 08/26/2022 0909  ALT 20 10/09/2020 1005   ALT 23 04/27/2013 1217   BILITOT 0.3 08/26/2022 0909   BILITOT 0.7 10/09/2020 1005   BILITOT 0.86 04/27/2013 1217

## 2023-02-18 ENCOUNTER — Telehealth: Payer: Self-pay

## 2023-02-18 NOTE — Telephone Encounter (Signed)
Called him and told him the office is still waiting on PA for PET scan. Radiology scheduling called him and moved the PET to 9/3. Told him I would follow for PA.

## 2023-02-21 ENCOUNTER — Ambulatory Visit: Payer: 59

## 2023-02-21 ENCOUNTER — Telehealth: Payer: Self-pay

## 2023-02-21 NOTE — Telephone Encounter (Signed)
Called and told him 8/29 appt canceled and the office will reschedule once he has PET scan.

## 2023-02-22 ENCOUNTER — Telehealth: Payer: Self-pay

## 2023-02-22 ENCOUNTER — Other Ambulatory Visit: Payer: Self-pay | Admitting: Hematology and Oncology

## 2023-02-22 DIAGNOSIS — C8111 Nodular sclerosis classical Hodgkin lymphoma, lymph nodes of head, face, and neck: Secondary | ICD-10-CM

## 2023-02-22 DIAGNOSIS — R591 Generalized enlarged lymph nodes: Secondary | ICD-10-CM

## 2023-02-22 NOTE — Telephone Encounter (Signed)
Called and given below message. He verbalized understanding and appreciated the call. He verbalized understanding and will call to schedule CT.   He had facial numbness on Friday, Saturday and Sunday that lasted 2-3 hours. The numbness has not happened since Sunday. Instructed to go to the ER if the numbness returns. He verbalized understanding. Forwarded above to Dr. Bertis Ruddy and she is aware.

## 2023-02-22 NOTE — Telephone Encounter (Signed)
-----   Message from Artis Delay sent at 02/22/2023  9:15 AM EDT ----- Please call him and get him to call to scehdule CT neck, chest, abdomen and pelvis to be done sometime next week and let me know when I will see him approx 3 days after CT to discuss Tell him his insurance wants Ct first I canceled his PET CT order

## 2023-02-24 ENCOUNTER — Ambulatory Visit: Payer: 59 | Admitting: Hematology and Oncology

## 2023-03-01 ENCOUNTER — Ambulatory Visit (HOSPITAL_COMMUNITY)
Admission: RE | Admit: 2023-03-01 | Discharge: 2023-03-01 | Disposition: A | Discharge status: Home / Self Care | Payer: 59 | Source: Ambulatory Visit | Attending: Hematology and Oncology | Admitting: Hematology and Oncology

## 2023-03-01 ENCOUNTER — Encounter (HOSPITAL_COMMUNITY): Payer: Self-pay

## 2023-03-01 ENCOUNTER — Ambulatory Visit: Payer: 59

## 2023-03-01 DIAGNOSIS — C8111 Nodular sclerosis classical Hodgkin lymphoma, lymph nodes of head, face, and neck: Secondary | ICD-10-CM | POA: Insufficient documentation

## 2023-03-01 DIAGNOSIS — R591 Generalized enlarged lymph nodes: Secondary | ICD-10-CM | POA: Insufficient documentation

## 2023-03-01 MED ORDER — IOHEXOL 300 MG/ML  SOLN
100.0000 mL | Freq: Once | INTRAMUSCULAR | Status: AC | PRN
  Administered 2023-03-01: 100 mL via INTRAVENOUS

## 2023-03-01 MED ORDER — IOHEXOL 9 MG/ML PO SOLN
ORAL | Status: AC
  Filled 2023-03-01: qty 1000

## 2023-03-01 MED ORDER — IOHEXOL 9 MG/ML PO SOLN
1000.0000 mL | ORAL | Status: AC
  Administered 2023-03-01: 1000 mL via ORAL

## 2023-03-02 ENCOUNTER — Telehealth: Payer: Self-pay

## 2023-03-02 NOTE — Telephone Encounter (Signed)
Called him back and told him that I told Dr. Bertis Ruddy about the facial numbness. Instructed to go to the ER for worsening symptoms. He verbalized understanding. Given appt with Dr. Bertis Ruddy on 9/10 at 2:30 pm. He is aware of appt and will call the office back for questions/concerns.

## 2023-03-02 NOTE — Telephone Encounter (Signed)
Attempted to call back. Complaining of intermittent facial / lip numbness that is not as bad as before.

## 2023-03-08 ENCOUNTER — Encounter: Payer: Self-pay | Admitting: Hematology and Oncology

## 2023-03-08 ENCOUNTER — Inpatient Hospital Stay: Payer: 59 | Attending: Hematology and Oncology | Admitting: Hematology and Oncology

## 2023-03-08 VITALS — BP 128/73 | HR 93 | Temp 98.9°F | Resp 18 | Ht 77.0 in | Wt 286.2 lb

## 2023-03-08 DIAGNOSIS — Z8571 Personal history of Hodgkin lymphoma: Secondary | ICD-10-CM | POA: Diagnosis present

## 2023-03-08 DIAGNOSIS — C8111 Nodular sclerosis classical Hodgkin lymphoma, lymph nodes of head, face, and neck: Secondary | ICD-10-CM

## 2023-03-08 DIAGNOSIS — R591 Generalized enlarged lymph nodes: Secondary | ICD-10-CM | POA: Insufficient documentation

## 2023-03-08 NOTE — Assessment & Plan Note (Signed)
I have reviewed multiple imaging studies with the patient We discussed limitation of imaging study of CT versus PET scan Overall, there is no signs of cancer recurrence, in my opinion The borderline abnormal lymphadenopathy in the head and neck region are nonspecific The abnormalities palpable near the parotid gland is consistent with a cyst which is considered benign etiology The patient is reassured I will see him in 6 months with blood work and examination

## 2023-03-08 NOTE — Progress Notes (Signed)
[facility] OFFICE PROGRESS NOTE  Patient Care Team: Mila Palmer, MD as PCP - General (Family Medicine) Tish Men, MD as Referring Physician (Gastroenterology) Charlott Rakes, MD as Consulting Physician (Gastroenterology) Artis Delay, MD as Consulting Physician (Hematology and Oncology) Serena Colonel, MD as Consulting Physician (Otolaryngology) Marcine Matar, MD as Consulting Physician (Urology)  ASSESSMENT & PLAN:  Hodgkin lymphoma [facility]) I have reviewed multiple imaging studies with the patient We discussed limitation of imaging study of CT versus PET scan Overall, there is no signs of cancer recurrence, in my opinion The borderline abnormal lymphadenopathy in the head and neck region are nonspecific The abnormalities palpable near the parotid gland is consistent with a cyst which is considered benign etiology The patient is reassured I will see him in 6 months with blood work and examination  No orders of the defined types were placed in this encounter.   All questions were answered. The patient knows to call the clinic with any problems, questions or concerns. The total time spent in the appointment was 30 minutes encounter with patients including review of chart and various tests results, discussions about plan of care and coordination of care plan   Artis Delay, MD 03/08/2023 3:29 PM  INTERVAL HISTORY: Please see below for problem oriented charting. he returns for review of test results He is feeling well We discussed and reviewed multiple imaging studies and discussed future plan of care  REVIEW OF SYSTEMS:   Constitutional: Denies fevers, chills or abnormal weight loss Eyes: Denies blurriness of vision Ears, nose, mouth, throat, and face: Denies mucositis or sore throat Respiratory: Denies cough, dyspnea or wheezes Cardiovascular: Denies palpitation, chest discomfort or lower extremity swelling Gastrointestinal:  Denies nausea, heartburn  or change in bowel habits Skin: Denies abnormal skin rashes Lymphatics: Denies new lymphadenopathy or easy bruising Neurological:Denies numbness, tingling or new weaknesses Behavioral/Psych: Mood is stable, no new changes  All other systems were reviewed with the patient and are negative.  I have reviewed the past medical history, past surgical history, social history and family history with the patient and they are unchanged from previous note.  ALLERGIES:  is allergic to amoxicillin.  MEDICATIONS:  Current Outpatient Medications  Medication Sig Dispense Refill   acetaminophen (TYLENOL) 500 MG tablet Take 1,000 mg by mouth every 6 (six) hours as needed for moderate pain or headache.     ascorbic acid (VITAMIN C) 500 MG tablet Take 500 mg by mouth daily.     Cholecalciferol (VITAMIN D) 50 MCG (2000 UT) tablet Take 5,000 Units by mouth daily.     ferrous sulfate 325 (65 FE) MG EC tablet Take 325 mg by mouth in the morning and at bedtime.     Magnesium 250 MG TABS Take 250 mg by mouth daily.     polyethylene glycol (MIRALAX / GLYCOLAX) 17 g packet Take 17 g by mouth daily as needed for mild constipation.     senna-docusate (SENOKOT-S) 8.6-50 MG tablet Take 1 tablet by mouth at bedtime as needed for moderate constipation. 30 tablet 0   sulfaSALAzine (AZULFIDINE) 500 MG tablet Take 500 mg by mouth 4 (four) times daily.     SUPER B COMPLEX/C PO Take 1 tablet by mouth daily.     No current facility-administered medications for this visit.    SUMMARY OF ONCOLOGIC HISTORY: Oncology History  Hodgkin lymphoma (HCC)  03/10/2020 Pathology Results   A. LYMPH NODE, RIGHT SUBMANDIBULAR, BIOPSY:  -  Classic Hodgkin lymphoma  -  See comment   COMMENT:   The submitted lymph nodes are effaced by a vaguely nodular lymphoid proliferation separated by bands of fibrosis with admixed inflammatory cells including neutrophils and eosinophils. The lymphoid proliferation  is composed predominantly of small  mature lymphocytes with scattered large atypical cells. The atypical cells have mono and multi-lobated nuclei and prominent nucleoli characteristic of Reed-Sternberg cells.  Lacunar cells are also present.   The neoplastic cells are CD30, CD15 (subset), Pax-5 (dim) and CD20 positive by immunohistochemistry. EBV by in-situ hybridization is positive in the Reed-Sternberg cells. CD3 highlights the background  T-cells.  EBV in-situ hybridization is negative.   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    03/14/2020 Initial Diagnosis   Hodgkin lymphoma (HCC)   03/14/2020 Cancer Staging   Staging form: Hodgkin and Non-Hodgkin Lymphoma, AJCC 8th Edition - Clinical stage from 03/14/2020: Stage I (Hodgkin lymphoma, B - Symptoms) - Signed by Artis Delay, MD on 03/14/2020   03/18/2020 Echocardiogram   1. Left ventricular ejection fraction, by estimation, is 55 to 60%. The left ventricle has normal function. The left ventricle has no regional wall motion abnormalities. Left ventricular diastolic parameters were normal. The average left ventricular global longitudinal strain is -15.9 %. The global longitudinal strain is normal.  2. Right ventricular systolic function is normal. The right ventricular size is normal. There is normal pulmonary artery systolic pressure.  3. The mitral valve is normal in structure. No evidence of mitral valve regurgitation. No evidence of mitral stenosis.  4. The aortic valve is normal in structure. Aortic valve regurgitation is not visualized. No aortic stenosis is present.  5. The inferior vena cava is normal in size with greater than 50% respiratory variability, suggesting right atrial pressure of 3 mmHg.   03/20/2020 Procedure   Placement of a subcutaneous port device. Catheter tip at the superior cavoatrial junction.   03/24/2020 - 09/08/2020 Chemotherapy   The patient had  ABVD for chemotherapy treatment.     05/16/2020 PET scan   1. Small hypermetabolic RIGHT level II lymph node is indeterminate. Activity is similar to liver activity. No comparison available. 2. Reduction in size and no significant metabolic activity of RIGHT supraclavicular lymph node ( Deauville 1). 3. No evidence lymphoma chest, abdomen pelvis.  Normal spleen.  4. Intense metabolic activity localizing to LEFT paraspinal musculature in the cervical neck. Favor benign physiologic muscle musculature in the LEFT neck. Recommend clinical correlation for muscle spasm or trauma. If concern for unlikely muscular lymphoma, consider contrast CT or MRI of the neck.   06/10/2020 Echocardiogram    1. Left ventricular ejection fraction, by estimation, is 55 to 60%. The left ventricle has normal function. The left ventricle has no regional wall motion abnormalities. Left ventricular diastolic parameters were normal. The average left ventricular global longitudinal strain is -21.6 %. The global longitudinal strain is normal.  2. Right ventricular systolic function is normal. The right ventricular size is normal. Tricuspid regurgitation signal is inadequate for assessing PA pressure.  3. The mitral valve is normal in structure. Trivial mitral valve regurgitation. No evidence of mitral stenosis.  4. The aortic valve is tricuspid. Aortic valve regurgitation is not visualized. No aortic stenosis is present.     10/10/2020 PET scan   Negative PET-CT for metabolically active lymphoma.  (Deauville 1)     11/03/2020 Procedure   Successful removal of implanted Port-A-Cath.   08/24/2021 PET scan  1. Mildly enlarged and mildly hypermetabolic 1.0 cm right level 2 neck lymph node, mildly increased in metabolism and not substantially changed in size since 10/09/2020 PET-CT, equivocal for recurrent Deauville score 3 Hodgkin lymphoma. 2. No additional potential findings of metabolically active Hodgkin lymphoma. 3. New mild  wall thickening with associated diffuse hypermetabolism throughout the sigmoid colon, suspicious for active inflammatory colitis in this patient with history of Crohn disease per prior imaging reports.     11/08/2022 Imaging   CT CHEST ABDOMEN PELVIS W CONTRAST  Result Date: 11/07/2022 CLINICAL DATA:  History of nodular sclerosing Hodgkin's lymphoma, monitor. * Tracking Code: BO * EXAM: CT CHEST, ABDOMEN, AND PELVIS WITH CONTRAST TECHNIQUE: Multidetector CT imaging of the chest, abdomen and pelvis was performed following the standard protocol during bolus administration of intravenous contrast. RADIATION DOSE REDUCTION: This exam was performed according to the departmental dose-optimization program which includes automated exposure control, adjustment of the mA and/or kV according to patient size and/or use of iterative reconstruction technique. CONTRAST:  OMNIPAQUE IOHEXOL 300 MG/ML  SOLN COMPARISON:  Multiple priors including CT March 06, 2022 and PET-CT August 21, 2021 FINDINGS: CT CHEST FINDINGS Cardiovascular: Normal caliber abdominal aorta. No central pulmonary embolus on this nondedicated study. Normal size heart. No significant pericardial effusion/thickening. Mediastinum/Nodes: No suspicious thyroid nodule. No pathologically enlarged mediastinal, hilar or axillary lymph nodes. The esophagus is grossly unremarkable. Lungs/Pleura: Stable 4 mm pulmonary nodule along the right minor fissure on image 87/4. No new suspicious pulmonary nodules or masses. No pleural effusion. No pneumothorax. Musculoskeletal: No aggressive lytic or blastic lesion of bone. CT ABDOMEN PELVIS FINDINGS Hepatobiliary: No suspicious hepatic lesion. Gallbladder is unremarkable. No biliary ductal dilation. Pancreas: No pancreatic ductal dilation or evidence of acute inflammation. Spleen: No splenomegaly. Adrenals/Urinary Tract: Bilateral adrenal glands appear normal. No hydronephrosis. Kidneys demonstrate symmetric  enhancement. Urinary bladder is unremarkable for degree of distension. Stomach/Bowel: Radiopaque enteric contrast material traverses the descending colon. Stomach is unremarkable for degree of distension. No pathologic dilation of small or large bowel. No convincing evidence of acute bowel inflammation. Vascular/Lymphatic: Normal caliber abdominal aorta. Smooth IVC contours. The portal, splenic and superior mesenteric veins are patent. No pathologically enlarged abdominal or pelvic lymph nodes. Reproductive: Prostate is unremarkable. Other: Linear band of right-sided perianal soft tissue on image 142/2 compatible with scarring/granulation tissue from the prior perianal abscess. Musculoskeletal: No acute or significant osseous findings. IMPRESSION: No thoracic or abdominopelvic adenopathy and no splenomegaly. Electronically Signed   By: Maudry Mayhew M.D.   On: 11/07/2022 08:25      03/08/2023 Imaging   1. 1 cm subcutaneous nodule overlying the right parotid gland with mild surrounding inflammatory changes, possibly a complicated sebaceous cyst. 2. New symmetric enlargement of the nasopharyngeal/adenoidal and palatine tonsillar soft tissues and slight interval enlargement of borderline enlarged bilateral upper cervical lymph nodes, indeterminate for recurrent lymphoma.       PHYSICAL EXAMINATION: ECOG PERFORMANCE STATUS: 0 - Asymptomatic  Vitals:   03/08/23 1432  BP: 128/73  Pulse: 93  Resp: 18  Temp: 98.9 F (37.2 C)  SpO2: 98%   Filed Weights   03/08/23 1432  Weight: 286 lb 3.2 oz (129.8 kg)    GENERAL:alert, no distress and comfortable  LABORATORY DATA:  I have reviewed the data as listed    Component Value Date/Time   NA 139 08/26/2022 0909   NA 141 04/27/2013 1217   K 4.1 08/26/2022 0909   K 4.0 04/27/2013 1217   CL 105 08/26/2022  0909   CL 106 04/26/2012 1550   CO2 29 08/26/2022 0909   CO2 28 04/27/2013 1217   GLUCOSE 73 08/26/2022 0909   GLUCOSE 92 04/27/2013 1217    GLUCOSE 108 (H) 04/26/2012 1550   BUN 19 08/26/2022 0909   BUN 10.2 04/27/2013 1217   CREATININE 0.88 08/26/2022 0909   CREATININE 0.84 10/09/2020 1005   CREATININE 0.8 04/27/2013 1217   CALCIUM 9.0 08/26/2022 0909   CALCIUM 9.7 04/27/2013 1217   PROT 7.1 08/26/2022 0909   PROT 7.5 04/27/2013 1217   ALBUMIN 4.1 08/26/2022 0909   ALBUMIN 3.6 04/27/2013 1217   AST 19 08/26/2022 0909   AST 22 10/09/2020 1005   AST 14 04/27/2013 1217   ALT 16 08/26/2022 0909   ALT 20 10/09/2020 1005   ALT 23 04/27/2013 1217   ALKPHOS 45 08/26/2022 0909   ALKPHOS 64 04/27/2013 1217   BILITOT 0.3 08/26/2022 0909   BILITOT 0.7 10/09/2020 1005   BILITOT 0.86 04/27/2013 1217   GFRNONAA >60 08/26/2022 0909   GFRNONAA >60 10/09/2020 1005   GFRAA >60 03/24/2020 0853    No results found for: "SPEP", "UPEP"  Lab Results  Component Value Date   WBC 7.1 08/26/2022   NEUTROABS 2.6 08/26/2022   HGB 12.9 (L) 08/26/2022   HCT 38.1 (L) 08/26/2022   MCV 87.2 08/26/2022   PLT 303 08/26/2022      Chemistry      Component Value Date/Time   NA 139 08/26/2022 0909   NA 141 04/27/2013 1217   K 4.1 08/26/2022 0909   K 4.0 04/27/2013 1217   CL 105 08/26/2022 0909   CL 106 04/26/2012 1550   CO2 29 08/26/2022 0909   CO2 28 04/27/2013 1217   BUN 19 08/26/2022 0909   BUN 10.2 04/27/2013 1217   CREATININE 0.88 08/26/2022 0909   CREATININE 0.84 10/09/2020 1005   CREATININE 0.8 04/27/2013 1217      Component Value Date/Time   CALCIUM 9.0 08/26/2022 0909   CALCIUM 9.7 04/27/2013 1217   ALKPHOS 45 08/26/2022 0909   ALKPHOS 64 04/27/2013 1217   AST 19 08/26/2022 0909   AST 22 10/09/2020 1005   AST 14 04/27/2013 1217   ALT 16 08/26/2022 0909   ALT 20 10/09/2020 1005   ALT 23 04/27/2013 1217   BILITOT 0.3 08/26/2022 0909   BILITOT 0.7 10/09/2020 1005   BILITOT 0.86 04/27/2013 1217       RADIOGRAPHIC STUDIES: I have reviewed multiple imaging studies with the patient I have personally reviewed the  radiological images as listed and agreed with the findings in the report. CT Soft Tissue Neck W Contrast  Result Date: 03/07/2023 CLINICAL DATA:  Hematologic malignancy, staging. History of Hodgkin lymphoma. EXAM: CT NECK WITH CONTRAST TECHNIQUE: Multidetector CT imaging of the neck was performed using the standard protocol following the bolus administration of intravenous contrast. RADIATION DOSE REDUCTION: This exam was performed according to the departmental dose-optimization program which includes automated exposure control, adjustment of the mA and/or kV according to patient size and/or use of iterative reconstruction technique. CONTRAST:  OMNIPAQUE IOHEXOL 300 MG/ML  SOLN COMPARISON:  PET-08/21/2021 FINDINGS: Pharynx and larynx: Symmetrically prominent bilateral palatine tonsillar and nasopharyngeal soft tissues, increased from the prior PET-CT. No focal mass. No retropharyngeal fluid. Salivary glands: No inflammation, mass, or stone. Thyroid: Unremarkable. Lymph nodes: Borderline enlarged level II lymph nodes bilaterally are slightly larger than on the prior PET-CT and measure up to 10 mm in short axis  bilaterally. Bilateral level Ib lymph nodes are also slightly larger than on the prior PET-CT but are all subcentimeter in short axis (measuring up to 8 mm), and some demonstrate a visible normal fatty hilum. Vascular: Major vascular structures of the neck are grossly patent. Limited intracranial: Unremarkable. Visualized orbits: Unremarkable. Mastoids and visualized paranasal sinuses: Small mucous retention cysts in the maxillary sinuses. No significant mastoid fluid. Skeleton: No suspicious osseous lesion. Upper chest: Reported separately on the contemporaneous chest CT. Other: In the area of palpable concern overlying the right parotid gland is a 1 cm subcutaneous nodule which demonstrates peripheral enhancement and central hypodensity and is new from the prior PET-CT. This extends to the skin, and  there is slight infiltration of the surrounding subcutaneous fat. IMPRESSION: 1. 1 cm subcutaneous nodule overlying the right parotid gland with mild surrounding inflammatory changes, possibly a complicated sebaceous cyst. 2. New symmetric enlargement of the nasopharyngeal/adenoidal and palatine tonsillar soft tissues and slight interval enlargement of borderline enlarged bilateral upper cervical lymph nodes, indeterminate for recurrent lymphoma. Electronically Signed   By: Sebastian Ache M.D.   On: 03/07/2023 13:59   CT CHEST ABDOMEN PELVIS W CONTRAST  Result Date: 03/04/2023 CLINICAL DATA:  Hematologic malignancy, suspect recurrent Hodgkin's lymphoma * Tracking Code: BO * EXAM: CT CHEST, ABDOMEN, AND PELVIS WITH CONTRAST TECHNIQUE: Multidetector CT imaging of the chest, abdomen and pelvis was performed following the standard protocol during bolus administration of intravenous contrast. RADIATION DOSE REDUCTION: This exam was performed according to the departmental dose-optimization program which includes automated exposure control, adjustment of the mA and/or kV according to patient size and/or use of iterative reconstruction technique. CONTRAST:  OMNIPAQUE IOHEXOL 300 MG/ML SOLN additional oral enteric contrast COMPARISON:  11/05/2022 FINDINGS: CT CHEST FINDINGS Cardiovascular: No significant vascular findings. Normal heart size. No pericardial effusion. Mediastinum/Nodes: No enlarged mediastinal, hilar, or axillary lymph nodes. Thyroid gland, trachea, and esophagus demonstrate no significant findings. Lungs/Pleura: Lungs are clear. Unchanged, definitively benign fissural lymph node of the medial segment right middle lobe requiring no specific further follow-up or characterization (series 4, image 105). No pleural effusion or pneumothorax. Musculoskeletal: No chest wall abnormality. No acute osseous findings. CT ABDOMEN PELVIS FINDINGS Hepatobiliary: No solid liver abnormality is seen. No gallstones,  gallbladder wall thickening, or biliary dilatation. Pancreas: Unremarkable. No pancreatic ductal dilatation or surrounding inflammatory changes. Spleen: Normal in size without significant abnormality. Adrenals/Urinary Tract: Adrenal glands are unremarkable. Kidneys are normal, without renal calculi, solid lesion, or hydronephrosis. Bladder is unremarkable. Stomach/Bowel: Stomach is within normal limits. Appendix appears normal. No evidence of bowel wall thickening, distention, or inflammatory changes. Vascular/Lymphatic: No significant vascular findings are present. No enlarged abdominal or pelvic lymph nodes. Reproductive: No mass or other abnormality. Other: No abdominal wall hernia or abnormality. No ascites. Surgical clips in the left lower quadrant. Musculoskeletal: No acute osseous findings. IMPRESSION: 1. No mass, lymphadenopathy, or metastatic disease in the chest, abdomen, or pelvis. 2. Normal spleen. Electronically Signed   By: Jearld Lesch M.D.   On: 03/04/2023 13:55

## 2023-03-11 ENCOUNTER — Ambulatory Visit: Payer: 59 | Admitting: Hematology and Oncology

## 2023-08-13 IMAGING — CT NM PET TUM IMG RESTAG (PS) SKULL BASE T - THIGH
1 of 7 series · 1 of 25 positions shown · non-contrast
Comparison: 05/31/2021 CT abdomen/pelvis.  10/09/2020 PET-CT.

CLINICAL DATA: Subsequent treatment strategy for nodular sclerosis
Hodgkin lymphoma, completed chemotherapy 09/08/2020. Reported new
lymph node in right neck and left groin.

EXAM:
NUCLEAR MEDICINE PET SKULL BASE TO THIGH
TECHNIQUE: 10.9 mCi F-18 FDG was injected intravenously. Full-ring PET imaging
was performed from the skull base to thigh after the radiotracer. CT
data was obtained and used for attenuation correction and anatomic
localization.
Fasting blood glucose: 108 mg/dl

[Series 4: ct sk_thigh 5.0 bf37 · axial · 5.0mm · 0.98mm/px · 1 of 264 slices shown]
[im 264/264  brain]
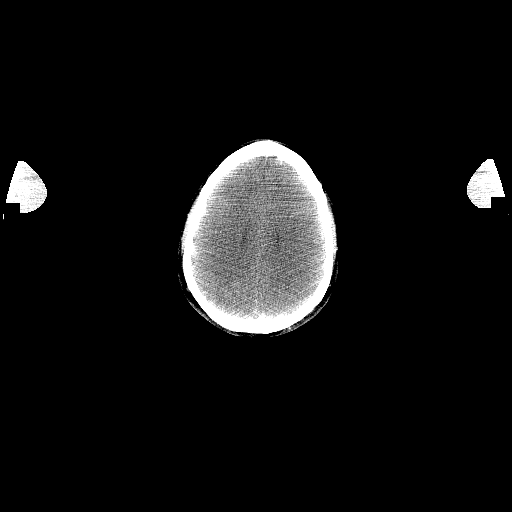

[1 of 25 positions shown; findings below may reference images not displayed]

FINDINGS: Mediastinal blood pool activity: SUV max

Liver activity: SUV max

NECK: Mildly enlarged 1.0 cm right level 2 neck lymph node
demonstrates mild hypermetabolism with max SUV 3.1 (series 4/image
40), previously 0.9 cm with max SUV 2.0, mildly increased in
metabolism and not substantially changed in size.

No additional enlarged or hypermetabolic lymph nodes in the neck.

Incidental CT findings: none

CHEST: No enlarged or hypermetabolic axillary, mediastinal or hilar
lymph nodes. No hypermetabolic pulmonary findings.

Incidental CT findings: Perifissural 0.4 cm right middle lobe nodule
along the minor fissure (series 8/image 46), unchanged and
considered benign. No new significant pulmonary nodules.

ABDOMEN/PELVIS: No abnormal hypermetabolic activity within the
liver, pancreas, adrenal glands, or spleen. No hypermetabolic lymph
nodes in the abdomen or pelvis. No pathologically enlarged or
hypermetabolic inguinal lymph nodes.

Mild wall thickening throughout the sigmoid colon appears new and is
associated with new diffuse hypermetabolism.

Incidental CT findings: Normal size spleen.

SKELETON: No focal hypermetabolic activity to suggest skeletal
metastasis.

Incidental CT findings: none
IMPRESSION: 1. Mildly enlarged and mildly hypermetabolic 1.0 cm right level 2
neck lymph node, mildly increased in metabolism and not
substantially changed in size since 10/09/2020 PET-CT, equivocal for
recurrent [HOSPITAL] score 3 Hodgkin lymphoma.
2. No additional potential findings of metabolically active Hodgkin
lymphoma.
3. New mild wall thickening with associated diffuse hypermetabolism
throughout the sigmoid colon, suspicious for active inflammatory
colitis in this patient with history of Crohn disease per prior
imaging reports.

## 2023-08-15 ENCOUNTER — Telehealth: Payer: Self-pay

## 2023-08-15 NOTE — Telephone Encounter (Signed)
 I can see him this Thursday with labs appt I am not familiar with the drug Stelara

## 2023-08-15 NOTE — Telephone Encounter (Signed)
 Returned his call. Since December he has had a lump under his left ear behind jawline. It is growing larger now and it is approximately 1 x 2 inches maybe. The area is sensitive to touch and has a rash/dry skin like eczema. He had a area like this behind his right ear that went away, it just has some dry skin now. He is complaining of being tired and cold symptoms.  He recently saw [facility] and will be changing to Stelara. He is asking if it has the same risks as the Humira with the risk for Hodgkin Lymphoma?

## 2023-08-15 NOTE — Telephone Encounter (Signed)
 Called and left a message asking him to call back to schedule appt.

## 2023-08-16 NOTE — Telephone Encounter (Signed)
 He called back and given appt on 2/20. He is aware of appts.

## 2023-08-17 ENCOUNTER — Telehealth: Payer: Self-pay | Admitting: Hematology and Oncology

## 2023-08-17 NOTE — Telephone Encounter (Signed)
 Left patient a vm regarding upcoming appointment

## 2023-08-18 ENCOUNTER — Ambulatory Visit: Payer: 59 | Admitting: Hematology and Oncology

## 2023-08-18 ENCOUNTER — Inpatient Hospital Stay: Payer: 59 | Admitting: Hematology and Oncology

## 2023-08-18 ENCOUNTER — Other Ambulatory Visit: Payer: 59

## 2023-08-18 ENCOUNTER — Inpatient Hospital Stay: Payer: 59

## 2023-08-19 ENCOUNTER — Other Ambulatory Visit: Payer: Self-pay

## 2023-08-19 ENCOUNTER — Encounter: Payer: Self-pay | Admitting: Hematology and Oncology

## 2023-08-19 ENCOUNTER — Inpatient Hospital Stay: Payer: 59 | Attending: Hematology and Oncology | Admitting: Hematology and Oncology

## 2023-08-19 ENCOUNTER — Inpatient Hospital Stay: Payer: 59

## 2023-08-19 VITALS — BP 136/78 | HR 85 | Temp 98.6°F | Resp 18 | Ht 77.0 in | Wt 284.8 lb

## 2023-08-19 DIAGNOSIS — L03811 Cellulitis of head [any part, except face]: Secondary | ICD-10-CM | POA: Diagnosis not present

## 2023-08-19 DIAGNOSIS — C8111 Nodular sclerosis classical Hodgkin lymphoma, lymph nodes of head, face, and neck: Secondary | ICD-10-CM

## 2023-08-19 DIAGNOSIS — K50919 Crohn's disease, unspecified, with unspecified complications: Secondary | ICD-10-CM

## 2023-08-19 DIAGNOSIS — L0390 Cellulitis, unspecified: Secondary | ICD-10-CM | POA: Insufficient documentation

## 2023-08-19 DIAGNOSIS — C8190 Hodgkin lymphoma, unspecified, unspecified site: Secondary | ICD-10-CM | POA: Diagnosis present

## 2023-08-19 LAB — CBC WITH DIFFERENTIAL (CANCER CENTER ONLY)
Abs Immature Granulocytes: 0.02 10*3/uL (ref 0.00–0.07)
Basophils Absolute: 0.1 10*3/uL (ref 0.0–0.1)
Basophils Relative: 1 %
Eosinophils Absolute: 0.3 10*3/uL (ref 0.0–0.5)
Eosinophils Relative: 3 %
HCT: 39.7 % (ref 39.0–52.0)
Hemoglobin: 13.3 g/dL (ref 13.0–17.0)
Immature Granulocytes: 0 %
Lymphocytes Relative: 32 %
Lymphs Abs: 2.7 10*3/uL (ref 0.7–4.0)
MCH: 30.2 pg (ref 26.0–34.0)
MCHC: 33.5 g/dL (ref 30.0–36.0)
MCV: 90.2 fL (ref 80.0–100.0)
Monocytes Absolute: 1 10*3/uL (ref 0.1–1.0)
Monocytes Relative: 11 %
Neutro Abs: 4.6 10*3/uL (ref 1.7–7.7)
Neutrophils Relative %: 53 %
Platelet Count: 299 10*3/uL (ref 150–400)
RBC: 4.4 MIL/uL (ref 4.22–5.81)
RDW: 14.4 % (ref 11.5–15.5)
WBC Count: 8.6 10*3/uL (ref 4.0–10.5)
nRBC: 0 % (ref 0.0–0.2)

## 2023-08-19 MED ORDER — SULFAMETHOXAZOLE-TRIMETHOPRIM 800-160 MG PO TABS: 1.0000 | ORAL_TABLET | Freq: Two times a day (BID) | ORAL | 0 refills | Status: DC

## 2023-08-19 NOTE — Assessment & Plan Note (Signed)
 This is suboptimally controlled The patient requested QuantiFERON gold test to be done per request by his gastroenterologist and we will order it

## 2023-08-19 NOTE — Assessment & Plan Note (Signed)
 He has lymphadenopathy near the angle of his jaw but I suspect it is related to surrounding skin infection The patient is reassured

## 2023-08-19 NOTE — Progress Notes (Signed)
 [facility] OFFICE PROGRESS NOTE  Patient Care Team: Mila Palmer, MD as PCP - General (Family Medicine) Tish Men, MD as Referring Physician (Gastroenterology) Charlott Rakes, MD as Consulting Physician (Gastroenterology) Artis Delay, MD as Consulting Physician (Hematology and Oncology) Serena Colonel, MD as Consulting Physician (Otolaryngology) Marcine Matar, MD as Consulting Physician (Urology)  ASSESSMENT & PLAN:  Hodgkin lymphoma [facility]) He has lymphadenopathy near the angle of his jaw but I suspect it is related to surrounding skin infection The patient is reassured  Cellulitis He has signs of cellulitis/skin infection affecting his left ear It resemble erysipelas He is allergic to penicillin I will give him a trial of Bactrim and we will call him next week to see if he would respond to the treatment Recommend he keeps his skin dry  Crohn disease (HCC) This is suboptimally controlled The patient requested QuantiFERON gold test to be done per request by his gastroenterologist and we will order it  Orders Placed This Encounter  Procedures   Quantiferon tb gold assay (blood)    Standing Status:   Future    Number of Occurrences:   1    Expected Date:   08/19/2023    Expiration Date:   08/18/2024    All questions were answered. The patient knows to call the clinic with any problems, questions or concerns. The total time spent in the appointment was 20 minutes encounter with patients including review of chart and various tests results, discussions about plan of care and coordination of care plan   Artis Delay, MD 08/19/2023 12:43 PM  INTERVAL HISTORY: Please see below for problem oriented charting. he returns for surveillance follow-up and due to his concern for lymphadenopathy in the left side of his face/angle of the jaw He has been having recurrent skin infection near the left side of his year recently No fever or chills No lymphadenopathy  elsewhere His Crohn's is suboptimally controlled with 10-12 bouts of diarrhea daily  REVIEW OF SYSTEMS:   Constitutional: Denies fevers, chills or abnormal weight loss Eyes: Denies blurriness of vision Ears, nose, mouth, throat, and face: Denies mucositis or sore throat Respiratory: Denies cough, dyspnea or wheezes Cardiovascular: Denies palpitation, chest discomfort or lower extremity swelling Neurological:Denies numbness, tingling or new weaknesses Behavioral/Psych: Mood is stable, no new changes  All other systems were reviewed with the patient and are negative.  I have reviewed the past medical history, past surgical history, social history and family history with the patient and they are unchanged from previous note.  ALLERGIES:  is allergic to amoxicillin.  MEDICATIONS:  Current Outpatient Medications  Medication Sig Dispense Refill   sulfamethoxazole-trimethoprim (BACTRIM DS) 800-160 MG tablet Take 1 tablet by mouth 2 (two) times daily. 14 tablet 0   acetaminophen (TYLENOL) 500 MG tablet Take 1,000 mg by mouth every 6 (six) hours as needed for moderate pain or headache.     ascorbic acid (VITAMIN C) 500 MG tablet Take 500 mg by mouth daily.     Cholecalciferol (VITAMIN D) 50 MCG (2000 UT) tablet Take 5,000 Units by mouth daily.     ferrous sulfate 325 (65 FE) MG EC tablet Take 325 mg by mouth in the morning and at bedtime.     Magnesium 250 MG TABS Take 250 mg by mouth daily.     polyethylene glycol (MIRALAX / GLYCOLAX) 17 g packet Take 17 g by mouth daily as needed for mild constipation.     senna-docusate (SENOKOT-S) 8.6-50 MG tablet Take  1 tablet by mouth at bedtime as needed for moderate constipation. 30 tablet 0   sulfaSALAzine (AZULFIDINE) 500 MG tablet Take 500 mg by mouth 4 (four) times daily.     SUPER B COMPLEX/C PO Take 1 tablet by mouth daily.     No current facility-administered medications for this visit.    SUMMARY OF ONCOLOGIC HISTORY: Oncology History   Hodgkin lymphoma (HCC)  03/10/2020 Pathology Results   A. LYMPH NODE, RIGHT SUBMANDIBULAR, BIOPSY:  -  Classic Hodgkin lymphoma  -  See comment   COMMENT:   The submitted lymph nodes are effaced by a vaguely nodular lymphoid proliferation separated by bands of fibrosis with admixed inflammatory cells including neutrophils and eosinophils. The lymphoid proliferation  is composed predominantly of small mature lymphocytes with scattered large atypical cells. The atypical cells have mono and multi-lobated nuclei and prominent nucleoli characteristic of Reed-Sternberg cells.  Lacunar cells are also present.   The neoplastic cells are CD30, CD15 (subset), Pax-5 (dim) and CD20 positive by immunohistochemistry. EBV by in-situ hybridization is positive in the Reed-Sternberg cells. CD3 highlights the background  T-cells.  EBV in-situ hybridization is negative.   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    03/14/2020 Initial Diagnosis   Hodgkin lymphoma (HCC)   03/14/2020 Cancer Staging   Staging form: Hodgkin and Non-Hodgkin Lymphoma, AJCC 8th Edition - Clinical stage from 03/14/2020: Stage I (Hodgkin lymphoma, B - Symptoms) - Signed by Artis Delay, MD on 03/14/2020   03/18/2020 Echocardiogram   1. Left ventricular ejection fraction, by estimation, is 55 to 60%. The left ventricle has normal function. The left ventricle has no regional wall motion abnormalities. Left ventricular diastolic parameters were normal. The average left ventricular global longitudinal strain is -15.9 %. The global longitudinal strain is normal.  2. Right ventricular systolic function is normal. The right ventricular size is normal. There is normal pulmonary artery systolic pressure.  3. The mitral valve is normal in structure. No evidence of mitral valve regurgitation. No evidence of mitral stenosis.  4. The aortic valve is  normal in structure. Aortic valve regurgitation is not visualized. No aortic stenosis is present.  5. The inferior vena cava is normal in size with greater than 50% respiratory variability, suggesting right atrial pressure of 3 mmHg.   03/20/2020 Procedure   Placement of a subcutaneous port device. Catheter tip at the superior cavoatrial junction.   03/24/2020 - 09/08/2020 Chemotherapy   The patient had ABVD for chemotherapy treatment.     05/16/2020 PET scan   1. Small hypermetabolic RIGHT level II lymph node is indeterminate. Activity is similar to liver activity. No comparison available. 2. Reduction in size and no significant metabolic activity of RIGHT supraclavicular lymph node ( Deauville 1). 3. No evidence lymphoma chest, abdomen pelvis.  Normal spleen.  4. Intense metabolic activity localizing to LEFT paraspinal musculature in the cervical neck. Favor benign physiologic muscle musculature in the LEFT neck. Recommend clinical correlation for muscle spasm or trauma. If concern for unlikely muscular lymphoma, consider contrast CT or MRI of the neck.   06/10/2020 Echocardiogram    1. Left ventricular ejection fraction, by estimation, is 55 to 60%. The left ventricle has normal function. The left ventricle has no regional wall motion abnormalities. Left ventricular diastolic parameters were normal. The average left ventricular global longitudinal strain is -21.6 %. The global longitudinal strain is normal.  2. Right ventricular systolic function is  normal. The right ventricular size is normal. Tricuspid regurgitation signal is inadequate for assessing PA pressure.  3. The mitral valve is normal in structure. Trivial mitral valve regurgitation. No evidence of mitral stenosis.  4. The aortic valve is tricuspid. Aortic valve regurgitation is not visualized. No aortic stenosis is present.     10/10/2020 PET scan   Negative PET-CT for metabolically active lymphoma.  (Deauville 1)     11/03/2020  Procedure   Successful removal of implanted Port-A-Cath.   08/24/2021 PET scan   1. Mildly enlarged and mildly hypermetabolic 1.0 cm right level 2 neck lymph node, mildly increased in metabolism and not substantially changed in size since 10/09/2020 PET-CT, equivocal for recurrent Deauville score 3 Hodgkin lymphoma. 2. No additional potential findings of metabolically active Hodgkin lymphoma. 3. New mild wall thickening with associated diffuse hypermetabolism throughout the sigmoid colon, suspicious for active inflammatory colitis in this patient with history of Crohn disease per prior imaging reports.     11/08/2022 Imaging   CT CHEST ABDOMEN PELVIS W CONTRAST  Result Date: 11/07/2022 CLINICAL DATA:  History of nodular sclerosing Hodgkin's lymphoma, monitor. * Tracking Code: BO * EXAM: CT CHEST, ABDOMEN, AND PELVIS WITH CONTRAST TECHNIQUE: Multidetector CT imaging of the chest, abdomen and pelvis was performed following the standard protocol during bolus administration of intravenous contrast. RADIATION DOSE REDUCTION: This exam was performed according to the departmental dose-optimization program which includes automated exposure control, adjustment of the mA and/or kV according to patient size and/or use of iterative reconstruction technique. CONTRAST:  OMNIPAQUE IOHEXOL 300 MG/ML  SOLN COMPARISON:  Multiple priors including CT March 06, 2022 and PET-CT August 21, 2021 FINDINGS: CT CHEST FINDINGS Cardiovascular: Normal caliber abdominal aorta. No central pulmonary embolus on this nondedicated study. Normal size heart. No significant pericardial effusion/thickening. Mediastinum/Nodes: No suspicious thyroid nodule. No pathologically enlarged mediastinal, hilar or axillary lymph nodes. The esophagus is grossly unremarkable. Lungs/Pleura: Stable 4 mm pulmonary nodule along the right minor fissure on image 87/4. No new suspicious pulmonary nodules or masses. No pleural effusion. No pneumothorax.  Musculoskeletal: No aggressive lytic or blastic lesion of bone. CT ABDOMEN PELVIS FINDINGS Hepatobiliary: No suspicious hepatic lesion. Gallbladder is unremarkable. No biliary ductal dilation. Pancreas: No pancreatic ductal dilation or evidence of acute inflammation. Spleen: No splenomegaly. Adrenals/Urinary Tract: Bilateral adrenal glands appear normal. No hydronephrosis. Kidneys demonstrate symmetric enhancement. Urinary bladder is unremarkable for degree of distension. Stomach/Bowel: Radiopaque enteric contrast material traverses the descending colon. Stomach is unremarkable for degree of distension. No pathologic dilation of small or large bowel. No convincing evidence of acute bowel inflammation. Vascular/Lymphatic: Normal caliber abdominal aorta. Smooth IVC contours. The portal, splenic and superior mesenteric veins are patent. No pathologically enlarged abdominal or pelvic lymph nodes. Reproductive: Prostate is unremarkable. Other: Linear band of right-sided perianal soft tissue on image 142/2 compatible with scarring/granulation tissue from the prior perianal abscess. Musculoskeletal: No acute or significant osseous findings. IMPRESSION: No thoracic or abdominopelvic adenopathy and no splenomegaly. Electronically Signed   By: Maudry Mayhew M.D.   On: 11/07/2022 08:25      03/08/2023 Imaging   1. 1 cm subcutaneous nodule overlying the right parotid gland with mild surrounding inflammatory changes, possibly a complicated sebaceous cyst. 2. New symmetric enlargement of the nasopharyngeal/adenoidal and palatine tonsillar soft tissues and slight interval enlargement of borderline enlarged bilateral upper cervical lymph nodes, indeterminate for recurrent lymphoma.       PHYSICAL EXAMINATION: ECOG PERFORMANCE STATUS: 1 - Symptomatic but completely ambulatory  Vitals:   08/19/23 1146  BP: 136/78  Pulse: 85  Resp: 18  Temp: 98.6 F (37 C)  SpO2: 98%   Filed Weights   08/19/23 1146  Weight:  284 lb 12.8 oz (129.2 kg)    GENERAL:alert, no distress and comfortable SKIN: Noted signs of cellulitis and skin infection around his left ear EYES: normal, Conjunctiva are pink and non-injected, sclera clear OROPHARYNX:no exudate, no erythema and lips, buccal mucosa, and tongue normal  NECK: supple, thyroid normal size, non-tender, without nodularity LYMPH: Noted reactive lymphadenopathy on the left angle of his jaw LUNGS: clear to auscultation and percussion with normal breathing effort HEART: regular rate & rhythm and no murmurs and no lower extremity edema ABDOMEN:abdomen soft, non-tender and normal bowel sounds Musculoskeletal:no cyanosis of digits and no clubbing  NEURO: alert & oriented x 3 with fluent speech, no focal motor/sensory deficits  LABORATORY DATA:  I have reviewed the data as listed    Component Value Date/Time   NA 139 08/26/2022 0909   NA 141 04/27/2013 1217   K 4.1 08/26/2022 0909   K 4.0 04/27/2013 1217   CL 105 08/26/2022 0909   CL 106 04/26/2012 1550   CO2 29 08/26/2022 0909   CO2 28 04/27/2013 1217   GLUCOSE 73 08/26/2022 0909   GLUCOSE 92 04/27/2013 1217   GLUCOSE 108 (H) 04/26/2012 1550   BUN 19 08/26/2022 0909   BUN 10.2 04/27/2013 1217   CREATININE 0.88 08/26/2022 0909   CREATININE 0.84 10/09/2020 1005   CREATININE 0.8 04/27/2013 1217   CALCIUM 9.0 08/26/2022 0909   CALCIUM 9.7 04/27/2013 1217   PROT 7.1 08/26/2022 0909   PROT 7.5 04/27/2013 1217   ALBUMIN 4.1 08/26/2022 0909   ALBUMIN 3.6 04/27/2013 1217   AST 19 08/26/2022 0909   AST 22 10/09/2020 1005   AST 14 04/27/2013 1217   ALT 16 08/26/2022 0909   ALT 20 10/09/2020 1005   ALT 23 04/27/2013 1217   ALKPHOS 45 08/26/2022 0909   ALKPHOS 64 04/27/2013 1217   BILITOT 0.3 08/26/2022 0909   BILITOT 0.7 10/09/2020 1005   BILITOT 0.86 04/27/2013 1217   GFRNONAA >60 08/26/2022 0909   GFRNONAA >60 10/09/2020 1005   GFRAA >60 03/24/2020 0853    No results found for: "SPEP",  "UPEP"  Lab Results  Component Value Date   WBC 8.6 08/19/2023   NEUTROABS 4.6 08/19/2023   HGB 13.3 08/19/2023   HCT 39.7 08/19/2023   MCV 90.2 08/19/2023   PLT 299 08/19/2023      Chemistry      Component Value Date/Time   NA 139 08/26/2022 0909   NA 141 04/27/2013 1217   K 4.1 08/26/2022 0909   K 4.0 04/27/2013 1217   CL 105 08/26/2022 0909   CL 106 04/26/2012 1550   CO2 29 08/26/2022 0909   CO2 28 04/27/2013 1217   BUN 19 08/26/2022 0909   BUN 10.2 04/27/2013 1217   CREATININE 0.88 08/26/2022 0909   CREATININE 0.84 10/09/2020 1005   CREATININE 0.8 04/27/2013 1217      Component Value Date/Time   CALCIUM 9.0 08/26/2022 0909   CALCIUM 9.7 04/27/2013 1217   ALKPHOS 45 08/26/2022 0909   ALKPHOS 64 04/27/2013 1217   AST 19 08/26/2022 0909   AST 22 10/09/2020 1005   AST 14 04/27/2013 1217   ALT 16 08/26/2022 0909   ALT 20 10/09/2020 1005   ALT 23 04/27/2013 1217   BILITOT 0.3 08/26/2022 0909  BILITOT 0.7 10/09/2020 1005   BILITOT 0.86 04/27/2013 1217

## 2023-08-19 NOTE — Assessment & Plan Note (Signed)
 He has signs of cellulitis/skin infection affecting his left ear It resemble erysipelas He is allergic to penicillin I will give him a trial of Bactrim and we will call him next week to see if he would respond to the treatment Recommend he keeps his skin dry

## 2023-08-22 ENCOUNTER — Telehealth: Payer: Self-pay

## 2023-08-22 NOTE — Telephone Encounter (Signed)
 Called to see how his skin area looks after starting antibiotic. He said it looks better and can tell a difference. He will call the office back for worsening symptoms.

## 2023-08-24 LAB — QUANTIFERON-TB GOLD PLUS (RQFGPL)
QuantiFERON Mitogen Value: >10 [IU]/mL
QuantiFERON Nil Value: 0.2 [IU]/mL
QuantiFERON TB1 Ag Value: 0.25 [IU]/mL
QuantiFERON TB2 Ag Value: 0.32 [IU]/mL

## 2023-08-24 LAB — QUANTIFERON-TB GOLD PLUS: QuantiFERON-TB Gold Plus: NEGATIVE

## 2023-09-08 ENCOUNTER — Ambulatory Visit: Payer: 59 | Admitting: Hematology and Oncology

## 2023-09-08 ENCOUNTER — Other Ambulatory Visit: Payer: 59

## 2023-11-10 ENCOUNTER — Other Ambulatory Visit: Payer: Self-pay | Admitting: Hematology and Oncology

## 2023-11-10 DIAGNOSIS — D509 Iron deficiency anemia, unspecified: Secondary | ICD-10-CM

## 2023-11-10 DIAGNOSIS — C8111 Nodular sclerosis classical Hodgkin lymphoma, lymph nodes of head, face, and neck: Secondary | ICD-10-CM

## 2023-11-11 ENCOUNTER — Inpatient Hospital Stay: Payer: 59

## 2023-11-11 ENCOUNTER — Encounter: Payer: Self-pay | Admitting: Hematology and Oncology

## 2023-11-11 ENCOUNTER — Inpatient Hospital Stay: Payer: 59 | Attending: Hematology and Oncology | Admitting: Hematology and Oncology

## 2023-11-11 VITALS — BP 134/86 | HR 93 | Temp 98.8°F | Resp 16 | Wt 286.0 lb

## 2023-11-11 DIAGNOSIS — L309 Dermatitis, unspecified: Secondary | ICD-10-CM | POA: Insufficient documentation

## 2023-11-11 DIAGNOSIS — L708 Other acne: Secondary | ICD-10-CM

## 2023-11-11 DIAGNOSIS — D509 Iron deficiency anemia, unspecified: Secondary | ICD-10-CM

## 2023-11-11 DIAGNOSIS — C8101 Nodular lymphocyte predominant Hodgkin lymphoma, lymph nodes of head, face, and neck: Secondary | ICD-10-CM | POA: Insufficient documentation

## 2023-11-11 DIAGNOSIS — R590 Localized enlarged lymph nodes: Secondary | ICD-10-CM | POA: Diagnosis not present

## 2023-11-11 DIAGNOSIS — C8111 Nodular sclerosis classical Hodgkin lymphoma, lymph nodes of head, face, and neck: Secondary | ICD-10-CM

## 2023-11-11 LAB — COMPREHENSIVE METABOLIC PANEL WITH GFR
ALT: 17 U/L (ref 0–44)
AST: 18 U/L (ref 15–41)
Albumin: 4.1 g/dL (ref 3.5–5.0)
Alkaline Phosphatase: 38 U/L (ref 38–126)
Anion gap: 4 — ABNORMAL LOW (ref 5–15)
BUN: 18 mg/dL (ref 6–20)
CO2: 28 mmol/L (ref 22–32)
Calcium: 9 mg/dL (ref 8.9–10.3)
Chloride: 106 mmol/L (ref 98–111)
Creatinine, Ser: 1.01 mg/dL (ref 0.61–1.24)
GFR, Estimated: >60 mL/min (ref >60)
Glucose, Bld: 81 mg/dL (ref 70–99)
Potassium: 4 mmol/L (ref 3.5–5.1)
Sodium: 138 mmol/L (ref 135–145)
Total Bilirubin: 0.2 mg/dL (ref 0.0–1.2)
Total Protein: 7.7 g/dL (ref 6.5–8.1)

## 2023-11-11 LAB — CBC WITH DIFFERENTIAL/PLATELET
Abs Immature Granulocytes: 0.02 10*3/uL (ref 0.00–0.07)
Basophils Absolute: 0.1 10*3/uL (ref 0.0–0.1)
Basophils Relative: 1 %
Eosinophils Absolute: 0.3 10*3/uL (ref 0.0–0.5)
Eosinophils Relative: 4 %
HCT: 37.4 % — ABNORMAL LOW (ref 39.0–52.0)
Hemoglobin: 12.6 g/dL — ABNORMAL LOW (ref 13.0–17.0)
Immature Granulocytes: 0 %
Lymphocytes Relative: 30 %
Lymphs Abs: 2.3 10*3/uL (ref 0.7–4.0)
MCH: 28.7 pg (ref 26.0–34.0)
MCHC: 33.7 g/dL (ref 30.0–36.0)
MCV: 85.2 fL (ref 80.0–100.0)
Monocytes Absolute: 1.1 10*3/uL — ABNORMAL HIGH (ref 0.1–1.0)
Monocytes Relative: 15 %
Neutro Abs: 3.8 10*3/uL (ref 1.7–7.7)
Neutrophils Relative %: 50 %
Platelets: 279 10*3/uL (ref 150–400)
RBC: 4.39 MIL/uL (ref 4.22–5.81)
RDW: 14.6 % (ref 11.5–15.5)
WBC: 7.6 10*3/uL (ref 4.0–10.5)
nRBC: 0 % (ref 0.0–0.2)

## 2023-11-11 LAB — FERRITIN: Ferritin: 12 ng/mL — ABNORMAL LOW (ref 24–336)

## 2023-11-11 MED ORDER — MINOCYCLINE HCL 100 MG PO CAPS: 100.0000 mg | ORAL_CAPSULE | Freq: Two times a day (BID) | ORAL | 0 refills | Status: DC

## 2023-11-11 NOTE — Assessment & Plan Note (Addendum)
 He was originally diagnosed with stage I classic Hodgkin lymphoma in September 2021 when he presented with lymphadenopathy He completed ABVD chemotherapy by March 2022 with complete response  Since last time I saw him, he continues to have recurrent eczema on both earlobes with associated regional lymphadenopathy that comes and goes He does not have signs or symptoms of recurrent lymphoma He is reassured Will see him again in 6 months for further follow-up

## 2023-11-11 NOTE — Progress Notes (Signed)
 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[facility]) He was originally diagnosed with stage I classic Hodgkin lymphoma in September 2021 when he presented with lymphadenopathy He completed ABVD chemotherapy by March 2022 with complete response  Since last time I saw him, he continues to have recurrent eczema on both earlobes with associated regional lymphadenopathy that comes and goes He does not have signs or symptoms of recurrent lymphoma He is reassured Will see him again in 6 months for further follow-up Acne-like skin bumps He has recurrent skin infection in the right axilla I will put him another course of oral antibiotics  No orders of the defined types were placed in this encounter.    Almeda Jacobs, MD  INTERVAL HISTORY: he returns for surveillance follow-up for history of Hodgkin lymphoma Since last time I saw him, he lost his job but he now has another job for coming up He is excited He had recent Crohn's disease flare but not severe He has intermittent lymphadenopathy near his earlobes with associated eczema No recent fever, chills or abnormal night sweats He thinks he have another infection of his skin on the right armpit area  PHYSICAL EXAMINATION: ECOG PERFORMANCE STATUS: 1 - Symptomatic but completely ambulatory  Vitals:   11/11/23 0936  BP: 134/86  Pulse: 93  Resp: 16  Temp: 98.8 F (37.1 C)  SpO2: 98%   Filed Weights   11/11/23 0936  Weight: 286 lb (129.7 kg)   GENERAL:alert, no distress and  comfortable SKIN: He has signs of skin infection on the right axilla EYES: normal, conjunctiva are pink and non-injected, sclera clear OROPHARYNX:no exudate, no erythema and lips, buccal mucosa, and tongue normal  NECK: supple, thyroid normal size, non-tender, without nodularity LYMPH: Small lymph node near the right preauricular area, stable. LUNGS: clear to auscultation and percussion with normal breathing effort HEART: regular rate & rhythm and no murmurs and no lower extremity edema ABDOMEN:abdomen soft, non-tender and normal bowel sounds Musculoskeletal:no cyanosis of digits and no clubbing  PSYCH: alert & oriented x 3 with fluent speech NEURO: no focal motor/sensory deficits  Relevant data reviewed during this visit included CBC and CMP

## 2023-11-11 NOTE — Assessment & Plan Note (Addendum)
 He has recurrent skin infection in the right axilla I will put him another course of oral antibiotics

## 2023-11-16 IMAGING — DX DG CHEST 2V
2 series · 2 of 2 positions shown · non-contrast
Comparison: 01/17/2015

CLINICAL DATA: Cough and fever.  Lymphoma

EXAM:
CHEST - 2 VIEW

[chest pa]
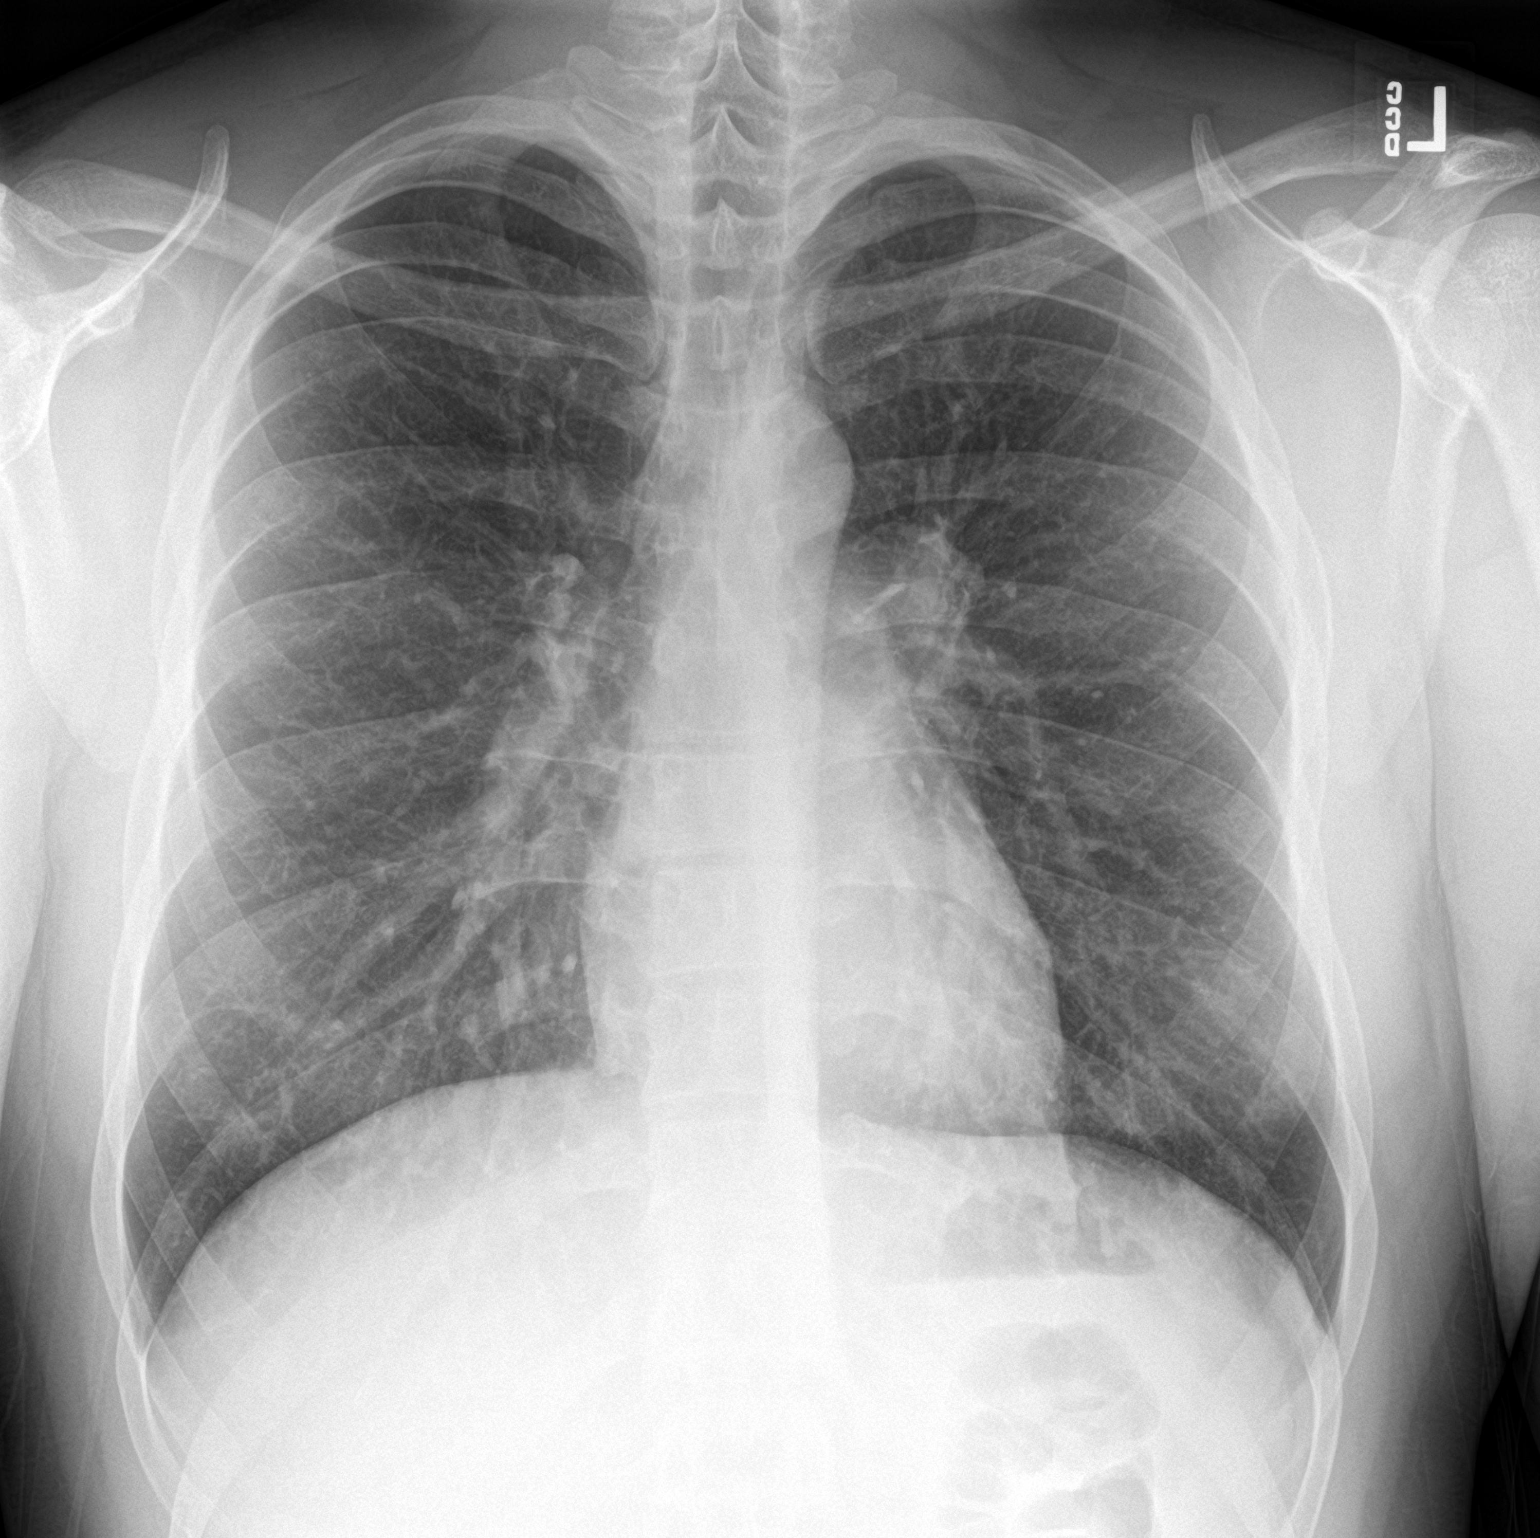

[chest lat]
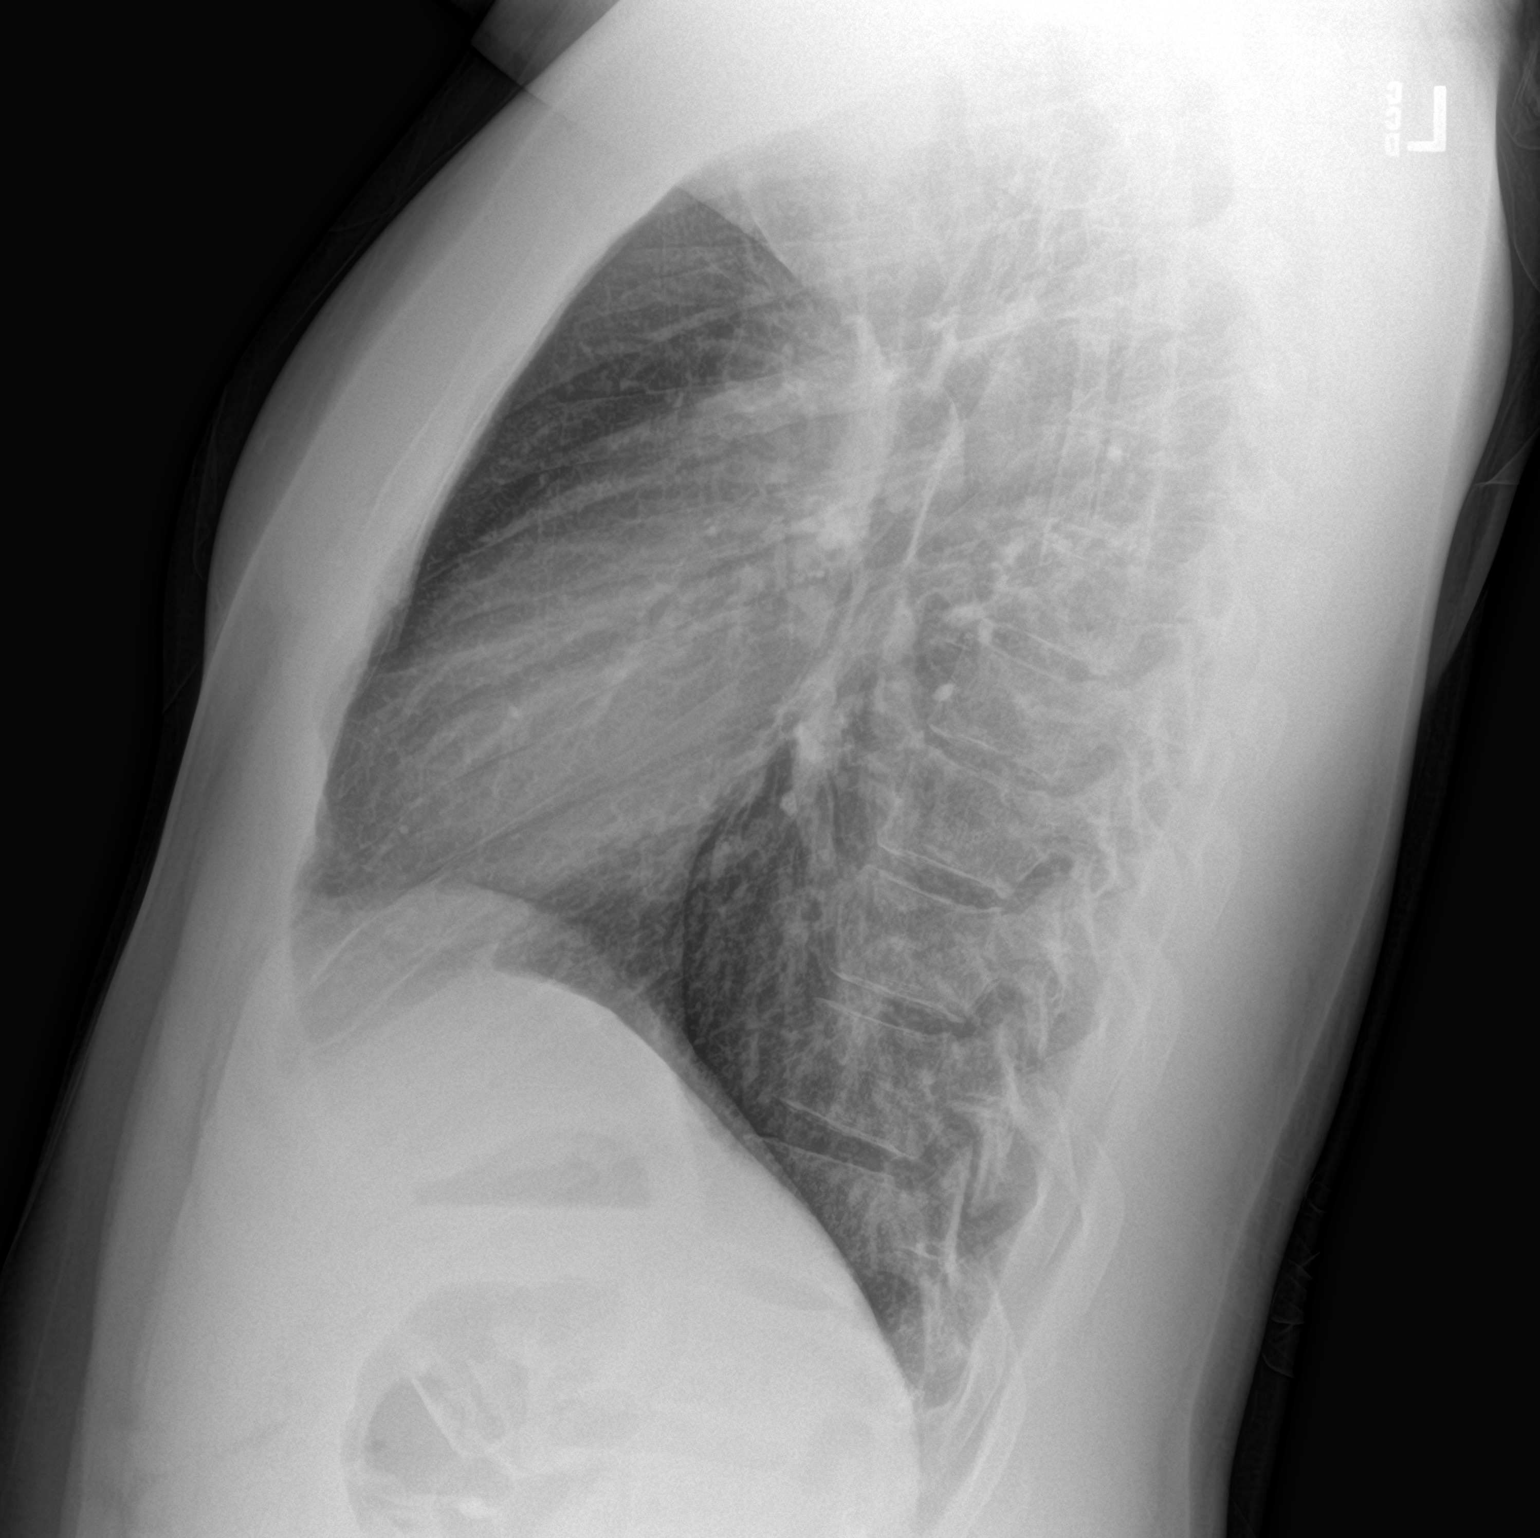

[2 of 2 positions shown; findings below may reference images not displayed]

FINDINGS: The heart size and mediastinal contours are within normal limits.
Both lungs are clear. The visualized skeletal structures are
unremarkable.
IMPRESSION: No active cardiopulmonary disease.

## 2024-05-17 ENCOUNTER — Inpatient Hospital Stay: Attending: Hematology and Oncology

## 2024-05-17 ENCOUNTER — Inpatient Hospital Stay: Admitting: Hematology and Oncology

## 2024-05-17 ENCOUNTER — Encounter: Payer: Self-pay | Admitting: Hematology and Oncology

## 2024-05-17 VITALS — BP 144/82 | HR 99 | Temp 99.0°F | Resp 18 | Ht 77.0 in | Wt 297.2 lb

## 2024-05-17 DIAGNOSIS — C8110 Nodular sclerosis classical Hodgkin lymphoma, unspecified site: Secondary | ICD-10-CM | POA: Diagnosis present

## 2024-05-17 DIAGNOSIS — Z9221 Personal history of antineoplastic chemotherapy: Secondary | ICD-10-CM | POA: Insufficient documentation

## 2024-05-17 DIAGNOSIS — D509 Iron deficiency anemia, unspecified: Secondary | ICD-10-CM

## 2024-05-17 DIAGNOSIS — C8111 Nodular sclerosis classical Hodgkin lymphoma, lymph nodes of head, face, and neck: Secondary | ICD-10-CM

## 2024-05-17 LAB — COMPREHENSIVE METABOLIC PANEL WITH GFR
ALT: 32 U/L (ref 0–44)
AST: 28 U/L (ref 15–41)
Albumin: 4.3 g/dL (ref 3.5–5.0)
Alkaline Phosphatase: 45 U/L (ref 38–126)
Anion gap: 10 (ref 5–15)
BUN: 23 mg/dL — ABNORMAL HIGH (ref 6–20)
CO2: 25 mmol/L (ref 22–32)
Calcium: 9.3 mg/dL (ref 8.9–10.3)
Chloride: 104 mmol/L (ref 98–111)
Creatinine, Ser: 1.03 mg/dL (ref 0.61–1.24)
GFR, Estimated: >60 mL/min (ref >60)
Glucose, Bld: 100 mg/dL — ABNORMAL HIGH (ref 70–99)
Potassium: 4.1 mmol/L (ref 3.5–5.1)
Sodium: 138 mmol/L (ref 135–145)
Total Bilirubin: 0.3 mg/dL (ref 0.0–1.2)
Total Protein: 7.6 g/dL (ref 6.5–8.1)

## 2024-05-17 LAB — CBC WITH DIFFERENTIAL/PLATELET
Abs Immature Granulocytes: 0.02 K/uL (ref 0.00–0.07)
Basophils Absolute: 0.1 K/uL (ref 0.0–0.1)
Basophils Relative: 1 %
Eosinophils Absolute: 0.3 K/uL (ref 0.0–0.5)
Eosinophils Relative: 4 %
HCT: 42.4 % (ref 39.0–52.0)
Hemoglobin: 14.1 g/dL (ref 13.0–17.0)
Immature Granulocytes: 0 %
Lymphocytes Relative: 32 %
Lymphs Abs: 2.3 K/uL (ref 0.7–4.0)
MCH: 29.1 pg (ref 26.0–34.0)
MCHC: 33.3 g/dL (ref 30.0–36.0)
MCV: 87.4 fL (ref 80.0–100.0)
Monocytes Absolute: 1 K/uL (ref 0.1–1.0)
Monocytes Relative: 14 %
Neutro Abs: 3.6 K/uL (ref 1.7–7.7)
Neutrophils Relative %: 49 %
Platelets: 282 K/uL (ref 150–400)
RBC: 4.85 MIL/uL (ref 4.22–5.81)
RDW: 14.5 % (ref 11.5–15.5)
WBC: 7.3 K/uL (ref 4.0–10.5)
nRBC: 0 % (ref 0.0–0.2)

## 2024-05-17 NOTE — Assessment & Plan Note (Addendum)
 He was originally diagnosed with stage I classic Hodgkin lymphoma in September 2021 when he presented with lymphadenopathy He completed ABVD chemotherapy by March 2022 with complete response  He does not have signs or symptoms of recurrent lymphoma He is reassured Will see him again in 12 months for further follow-up

## 2024-05-17 NOTE — Progress Notes (Signed)
 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    Hicks Lonn, MD  INTERVAL HISTORY: he returns for surveillance follow-up for history of Hodgkin lymphoma He is doing well His Crohn's is well-controlled with biologic medications Denies recent bleeding No new lymphadenopathy  PHYSICAL EXAMINATION: ECOG PERFORMANCE STATUS: 0 - Asymptomatic  Vitals:   05/17/24 0919  BP: (!) 144/82  Pulse: 99  Resp: 18  Temp: 99 F (37.2 C)  SpO2: 97%   Filed Weights   05/17/24 0919  Weight: 297 lb 3.2 oz (134.8 kg)   GENERAL:alert, no distress and comfortable SKIN: skin color, texture, turgor are normal, no rashes or significant lesions EYES: normal, conjunctiva are pink and non-injected, sclera clear OROPHARYNX:no exudate, no erythema and lips, buccal mucosa, and tongue normal  NECK: supple, thyroid normal size, non-tender, without nodularity LYMPH:  no palpable lymphadenopathy in the cervical, axillary or inguinal LUNGS: clear to auscultation and percussion with normal breathing effort HEART: regular rate & rhythm and no murmurs and no lower  extremity edema ABDOMEN:abdomen soft, non-tender and normal bowel sounds Musculoskeletal:no cyanosis of digits and no clubbing  PSYCH: alert & oriented x 3 with fluent speech NEURO: no focal motor/sensory deficits  Relevant data reviewed during this visit included CBC and CMP

## 2025-05-20 ENCOUNTER — Inpatient Hospital Stay

## 2025-05-20 ENCOUNTER — Inpatient Hospital Stay: Admitting: Hematology and Oncology
